# Patient Record
Sex: Female | Born: 1960 | Race: White | Hispanic: No | Marital: Married | Smoking: Never smoker
Health system: Southern US, Community
[De-identification: ages and names within clinical notes are randomized; demographics above are authoritative.]

## PROBLEM LIST (undated history)

## (undated) DIAGNOSIS — F32A Depression, unspecified: Secondary | ICD-10-CM

## (undated) DIAGNOSIS — K589 Irritable bowel syndrome without diarrhea: Secondary | ICD-10-CM

## (undated) DIAGNOSIS — G43909 Migraine, unspecified, not intractable, without status migrainosus: Secondary | ICD-10-CM

## (undated) DIAGNOSIS — T7840XA Allergy, unspecified, initial encounter: Secondary | ICD-10-CM

## (undated) DIAGNOSIS — M1990 Unspecified osteoarthritis, unspecified site: Secondary | ICD-10-CM

## (undated) DIAGNOSIS — J45909 Unspecified asthma, uncomplicated: Secondary | ICD-10-CM

## (undated) DIAGNOSIS — F329 Major depressive disorder, single episode, unspecified: Secondary | ICD-10-CM

## (undated) DIAGNOSIS — E781 Pure hyperglyceridemia: Principal | ICD-10-CM

## (undated) DIAGNOSIS — N200 Calculus of kidney: Secondary | ICD-10-CM

## (undated) HISTORY — DX: Depression, unspecified: F32.A

## (undated) HISTORY — DX: Pure hyperglyceridemia: E78.1

## (undated) HISTORY — DX: Migraine, unspecified, not intractable, without status migrainosus: G43.909

## (undated) HISTORY — DX: Unspecified asthma, uncomplicated: J45.909

## (undated) HISTORY — DX: Allergy, unspecified, initial encounter: T78.40XA

## (undated) HISTORY — DX: Irritable bowel syndrome, unspecified: K58.9

## (undated) HISTORY — DX: Major depressive disorder, single episode, unspecified: F32.9

## (undated) HISTORY — DX: Unspecified osteoarthritis, unspecified site: M19.90

## (undated) HISTORY — DX: Calculus of kidney: N20.0

---

## 1996-05-02 HISTORY — PX: OTHER SURGICAL HISTORY: SHX169

## 2016-07-25 ENCOUNTER — Ambulatory Visit (INDEPENDENT_AMBULATORY_CARE_PROVIDER_SITE_OTHER): Payer: BLUE CROSS/BLUE SHIELD | Admitting: Nurse Practitioner

## 2016-07-25 ENCOUNTER — Encounter: Payer: Self-pay | Admitting: Nurse Practitioner

## 2016-07-25 VITALS — BP 112/64 | HR 95 | Temp 98.3°F | Ht 69.0 in | Wt 219.0 lb

## 2016-07-25 DIAGNOSIS — R739 Hyperglycemia, unspecified: Secondary | ICD-10-CM

## 2016-07-25 DIAGNOSIS — J3089 Other allergic rhinitis: Secondary | ICD-10-CM | POA: Diagnosis not present

## 2016-07-25 DIAGNOSIS — Z136 Encounter for screening for cardiovascular disorders: Secondary | ICD-10-CM

## 2016-07-25 DIAGNOSIS — Z13220 Encounter for screening for lipoid disorders: Secondary | ICD-10-CM

## 2016-07-25 DIAGNOSIS — E669 Obesity, unspecified: Secondary | ICD-10-CM

## 2016-07-25 DIAGNOSIS — Z1159 Encounter for screening for other viral diseases: Secondary | ICD-10-CM | POA: Diagnosis not present

## 2016-07-25 DIAGNOSIS — Z8739 Personal history of other diseases of the musculoskeletal system and connective tissue: Secondary | ICD-10-CM | POA: Insufficient documentation

## 2016-07-25 DIAGNOSIS — F339 Major depressive disorder, recurrent, unspecified: Secondary | ICD-10-CM

## 2016-07-25 DIAGNOSIS — J309 Allergic rhinitis, unspecified: Secondary | ICD-10-CM | POA: Insufficient documentation

## 2016-07-25 MED ORDER — ALBUTEROL SULFATE HFA 108 (90 BASE) MCG/ACT IN AERS: 1.0000 | INHALATION_SPRAY | Freq: Four times a day (QID) | RESPIRATORY_TRACT | 2 refills | Status: DC | PRN

## 2016-07-25 MED ORDER — CETIRIZINE HCL 10 MG PO TABS: 10.0000 mg | ORAL_TABLET | Freq: Every day | ORAL | 0 refills | Status: DC

## 2016-07-25 MED ORDER — SALINE SPRAY 0.65 % NA SOLN: 1.0000 | NASAL | 0 refills | Status: DC | PRN

## 2016-07-25 MED ORDER — CITALOPRAM HYDROBROMIDE 20 MG PO TABS: 20.0000 mg | ORAL_TABLET | Freq: Every day | ORAL | 1 refills | Status: DC
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
## 2016-07-25 NOTE — Progress Notes (Signed)
Pre visit review using our clinic review tool, if applicable. No additional management support is needed unless otherwise documented below in the visit note. 

## 2016-07-25 NOTE — Patient Instructions (Addendum)
Sign medical release to get records from previous pcp.  Go to lab in basement when fasting at least 6-8hrs prior to blood draw.

## 2016-07-26 DIAGNOSIS — F339 Major depressive disorder, recurrent, unspecified: Secondary | ICD-10-CM | POA: Insufficient documentation

## 2016-07-26 DIAGNOSIS — R739 Hyperglycemia, unspecified: Secondary | ICD-10-CM | POA: Insufficient documentation

## 2016-07-26 DIAGNOSIS — F325 Major depressive disorder, single episode, in full remission: Secondary | ICD-10-CM | POA: Insufficient documentation

## 2016-07-26 NOTE — Assessment & Plan Note (Signed)
Not interested In any treatment at this time.

## 2016-07-27 ENCOUNTER — Other Ambulatory Visit (INDEPENDENT_AMBULATORY_CARE_PROVIDER_SITE_OTHER): Payer: BLUE CROSS/BLUE SHIELD

## 2016-07-27 DIAGNOSIS — R739 Hyperglycemia, unspecified: Secondary | ICD-10-CM | POA: Diagnosis not present

## 2016-07-27 DIAGNOSIS — Z13220 Encounter for screening for lipoid disorders: Secondary | ICD-10-CM

## 2016-07-27 DIAGNOSIS — E781 Pure hyperglyceridemia: Secondary | ICD-10-CM

## 2016-07-27 DIAGNOSIS — Z1159 Encounter for screening for other viral diseases: Secondary | ICD-10-CM

## 2016-07-27 DIAGNOSIS — E669 Obesity, unspecified: Secondary | ICD-10-CM | POA: Diagnosis not present

## 2016-07-27 LAB — LDL CHOLESTEROL, DIRECT: Direct LDL: 124 mg/dL

## 2016-07-27 LAB — COMPREHENSIVE METABOLIC PANEL
ALT: 27 U/L (ref 0–35)
AST: 23 U/L (ref 0–37)
Albumin: 4.4 g/dL (ref 3.5–5.2)
Alkaline Phosphatase: 77 U/L (ref 39–117)
BUN: 9 mg/dL (ref 6–23)
CALCIUM: 9.8 mg/dL (ref 8.4–10.5)
CHLORIDE: 101 meq/L (ref 96–112)
CO2: 29 meq/L (ref 19–32)
Creatinine, Ser: 0.65 mg/dL (ref 0.40–1.20)
GFR: 101.11 mL/min (ref >60.00)
GLUCOSE: 98 mg/dL (ref 70–99)
Potassium: 3.8 mEq/L (ref 3.5–5.1)
Sodium: 139 mEq/L (ref 135–145)
Total Bilirubin: 0.7 mg/dL (ref 0.2–1.2)
Total Protein: 7.4 g/dL (ref 6.0–8.3)

## 2016-07-27 LAB — LIPID PANEL
CHOL/HDL RATIO: 7
CHOLESTEROL: 305 mg/dL — AB (ref 0–200)
HDL: 46.5 mg/dL (ref >39.00)
Triglycerides: 606 mg/dL — ABNORMAL HIGH (ref 0.0–149.0)

## 2016-07-27 LAB — TSH: TSH: 2.07 u[IU]/mL (ref 0.35–4.50)

## 2016-07-27 LAB — HEMOGLOBIN A1C: HEMOGLOBIN A1C: 5.7 % (ref 4.6–6.5)

## 2016-07-28 LAB — HEPATITIS C ANTIBODY: HCV Ab: NEGATIVE

## 2016-07-28 MED ORDER — OMEGA-3-ACID ETHYL ESTERS 1 G PO CAPS: 2.0000 g | ORAL_CAPSULE | Freq: Every day | ORAL | 1 refills | Status: DC

## 2016-07-28 MED ORDER — FENOFIBRATE 145 MG PO TABS: 145.0000 mg | ORAL_TABLET | Freq: Every day | ORAL | 1 refills | Status: DC

## 2016-08-04 ENCOUNTER — Ambulatory Visit (INDEPENDENT_AMBULATORY_CARE_PROVIDER_SITE_OTHER): Payer: BLUE CROSS/BLUE SHIELD | Admitting: Nurse Practitioner

## 2016-08-04 ENCOUNTER — Encounter: Payer: Self-pay | Admitting: Nurse Practitioner

## 2016-08-04 VITALS — BP 124/80 | HR 81 | Temp 98.0°F | Ht 69.0 in | Wt 222.0 lb

## 2016-08-04 DIAGNOSIS — H6502 Acute serous otitis media, left ear: Secondary | ICD-10-CM

## 2016-08-04 DIAGNOSIS — R6889 Other general symptoms and signs: Secondary | ICD-10-CM

## 2016-08-04 DIAGNOSIS — J0140 Acute pansinusitis, unspecified: Secondary | ICD-10-CM

## 2016-08-04 LAB — POCT INFLUENZA A/B
INFLUENZA A, POC: NEGATIVE
INFLUENZA B, POC: NEGATIVE

## 2016-08-04 MED ORDER — AMOXICILLIN-POT CLAVULANATE 875-125 MG PO TABS: 1.0000 | ORAL_TABLET | Freq: Two times a day (BID) | ORAL | 0 refills | Status: DC

## 2016-08-04 MED ORDER — IPRATROPIUM BROMIDE 0.03 % NA SOLN: 2.0000 | Freq: Two times a day (BID) | NASAL | 0 refills | Status: DC

## 2016-08-04 MED ORDER — HYDROCODONE-HOMATROPINE 5-1.5 MG/5ML PO SYRP: 5.0000 mL | ORAL_SOLUTION | Freq: Every evening | ORAL | 0 refills | Status: DC | PRN

## 2016-08-04 MED ORDER — DM-GUAIFENESIN ER 30-600 MG PO TB12: 1.0000 | ORAL_TABLET | Freq: Two times a day (BID) | ORAL | 0 refills | Status: DC | PRN

## 2016-08-04 MED ORDER — METHYLPREDNISOLONE ACETATE 80 MG/ML IJ SUSP
80.0000 mg | Freq: Once | INTRAMUSCULAR | Status: AC
  Administered 2016-08-04: 80 mg via INTRAMUSCULAR

## 2016-08-04 NOTE — Progress Notes (Signed)
Subjective:  Patient ID: Christina Huerta, female    DOB: 02-24-1961  Age: 56 y.o. MRN: [id number]  CC: Sore Throat (sore troat,headache,coughing mucus (see some blood) for 3 days. took otc--- req written rx for today. )   URI   This is a new problem. The current episode started yesterday. The problem has been rapidly worsening. The maximum temperature recorded prior to her arrival was 100.4 - 100.9 F. Associated symptoms include chest pain, congestion, coughing, ear pain, headaches, a plugged ear sensation, rhinorrhea, sinus pain, a sore throat, swollen glands and wheezing. Pertinent negatives include no abdominal pain. She has tried acetaminophen, decongestant and increased fluids for the symptoms. The treatment provided no relief.    Outpatient Medications Prior to Visit  Medication Sig Dispense Refill  . albuterol (PROAIR HFA) 108 (90 Base) MCG/ACT inhaler Inhale 1-2 puffs into the lungs every 6 (six) hours as needed for wheezing or shortness of breath. 1 Inhaler 2  . Biotin 40981 MCG TABS Take 10,000 mg by mouth 2 (two) times daily.    . Black Cohosh 80 MG CAPS Take 80 mg by mouth 1 day or 1 dose.    . cetirizine (ZYRTEC) 10 MG tablet Take 1 tablet (10 mg total) by mouth daily. 30 tablet 0  . citalopram (CELEXA) 20 MG tablet Take 1 tablet (20 mg total) by mouth daily. 90 tablet 1  . Digestive Enzymes (DIGESTIVE SUPPORT PO) Take by mouth. IB Gard OTC    . fenofibrate (TRICOR) 145 MG tablet Take 1 tablet (145 mg total) by mouth daily. 90 tablet 1  . Multiple Vitamins-Minerals (MULTIVITAMIN ADULT PO) Take by mouth.    . omega-3 acid ethyl esters (LOVAZA) 1 g capsule Take 2 capsules (2 g total) by mouth daily. 180 capsule 1  . Probiotic Product (ALIGN PO) Take by mouth 1 day or 1 dose.    . Psyllium (METAMUCIL FIBER PO) Take by mouth 1 day or 1 dose.    . sodium chloride (OCEAN) 0.65 % SOLN nasal spray Place 1 spray into both nostrils as needed for congestion. 15 mL 0   No  facility-administered medications prior to visit.     ROS See HPI  Objective:  BP 124/80   Pulse 81   Temp 98 F (36.7 C)   Ht  (1.753 m)   Wt 222 lb (100.7 kg)   SpO2 98%   BMI 32.78 kg/m   BP Readings from Last 3 Encounters:  08/04/16 124/80  07/25/16 112/64    Wt Readings from Last 3 Encounters:  08/04/16 222 lb (100.7 kg)  07/25/16 219 lb (99.3 kg)    Physical Exam  Constitutional: She is oriented to person, place, and time.  HENT:  Right Ear: External ear and ear canal normal. Tympanic membrane is injected and erythematous. A middle ear effusion is present.  Left Ear: External ear and ear canal normal. Tympanic membrane is injected and erythematous. A middle ear effusion is present.  Nose: Mucosal edema and rhinorrhea present. Right sinus exhibits maxillary sinus tenderness and frontal sinus tenderness. Left sinus exhibits maxillary sinus tenderness and frontal sinus tenderness.  Mouth/Throat: Uvula is midline. No trismus in the jaw. Posterior oropharyngeal erythema present. No oropharyngeal exudate.  Eyes: No scleral icterus.  Neck: Normal range of motion. Neck supple.  Cardiovascular: Normal rate and normal heart sounds.   Pulmonary/Chest: Effort normal and breath sounds normal.  Musculoskeletal: She exhibits no edema.  Lymphadenopathy:    She has cervical adenopathy.  Neurological: She is alert and oriented to person, place, and time.  Vitals reviewed.   Lab Results  Component Value Date   GLUCOSE 98 07/27/2016   CHOL 305 (H) 07/27/2016   TRIG (H) 07/27/2016    606.0 Triglyceride is over 400; calculations on Lipids are invalid.   HDL 46.50 07/27/2016   LDLDIRECT 124.0 07/27/2016   ALT 27 07/27/2016   AST 23 07/27/2016   NA 139 07/27/2016   K 3.8 07/27/2016   CL 101 07/27/2016   CREATININE 0.65 07/27/2016   BUN 9 07/27/2016   CO2 29 07/27/2016   TSH 2.07 07/27/2016   HGBA1C 5.7 07/27/2016    Patient was never admitted.  Assessment & Plan:    Christina Huerta was seen today for sore throat.  Diagnoses and all orders for this visit:  Flu-like symptoms -     POCT Influenza A/B -     methylPREDNISolone acetate (DEPO-MEDROL) injection 80 mg; Inject 1 mL (80 mg total) into the muscle once. -     ipratropium (ATROVENT) 0.03 % nasal spray; Place 2 sprays into both nostrils 2 (two) times daily. Do not use for more than 5days. -     dextromethorphan-guaiFENesin (MUCINEX DM) 30-600 MG 12hr tablet; Take 1 tablet by mouth 2 (two) times daily as needed for cough. -     amoxicillin-clavulanate (AUGMENTIN) 875-125 MG tablet; Take 1 tablet by mouth 2 (two) times daily. -     HYDROcodone-homatropine (HYCODAN) 5-1.5 MG/5ML syrup; Take 5 mLs by mouth at bedtime as needed for cough.  Acute non-recurrent pansinusitis  Acute serous otitis media of left ear, recurrence not specified   I am having Christina Huerta start on ipratropium, dextromethorphan-guaiFENesin, amoxicillin-clavulanate, and HYDROcodone-homatropine. I am also having her maintain her Probiotic Product (ALIGN PO), Psyllium (METAMUCIL FIBER PO), Biotin, Black Cohosh, Multiple Vitamins-Minerals (MULTIVITAMIN ADULT PO), Digestive Enzymes (DIGESTIVE SUPPORT PO), cetirizine, sodium chloride, albuterol, citalopram, fenofibrate, and omega-3 acid ethyl esters. We administered methylPREDNISolone acetate.  Meds ordered this encounter  Medications  . methylPREDNISolone acetate (DEPO-MEDROL) injection 80 mg  . ipratropium (ATROVENT) 0.03 % nasal spray    Sig: Place 2 sprays into both nostrils 2 (two) times daily. Do not use for more than 5days.    Dispense:  30 mL    Refill:  0    Order Specific Question:   Supervising Provider    Answer:   Tresa Garter [[id number]]  . dextromethorphan-guaiFENesin (MUCINEX DM) 30-600 MG 12hr tablet    Sig: Take 1 tablet by mouth 2 (two) times daily as needed for cough.    Dispense:  14 tablet    Refill:  0    Order Specific Question:   Supervising Provider    Answer:    Tresa Garter [[id number]]  . amoxicillin-clavulanate (AUGMENTIN) 875-125 MG tablet    Sig: Take 1 tablet by mouth 2 (two) times daily.    Dispense:  14 tablet    Refill:  0    Order Specific Question:   Supervising Provider    Answer:   Tresa Garter [[id number]]  . HYDROcodone-homatropine (HYCODAN) 5-1.5 MG/5ML syrup    Sig: Take 5 mLs by mouth at bedtime as needed for cough.    Dispense:  120 mL    Refill:  0    Order Specific Question:   Supervising Provider    Answer:   Tresa Garter [[id number]]    Follow-up: No Follow-up on file.  [name], NP

## 2016-08-04 NOTE — Patient Instructions (Signed)
Encourage adequate oral hydration.  Sinusitis, Adult Sinusitis is soreness and inflammation of your sinuses. Sinuses are hollow spaces in the bones around your face. Your sinuses are located:  Around your eyes.  In the middle of your forehead.  Behind your nose.  In your cheekbones. Your sinuses and nasal passages are lined with a stringy fluid (mucus). Mucus normally drains out of your sinuses. When your nasal tissues become inflamed or swollen, the mucus can become trapped or blocked so air cannot flow through your sinuses. This allows bacteria, viruses, and funguses to grow, which leads to infection. Sinusitis can develop quickly and last for 7?10 days (acute) or for more than 12 weeks (chronic). Sinusitis often develops after a cold. What are the causes? This condition is caused by anything that creates swelling in the sinuses or stops mucus from draining, including:  Allergies.  Asthma.  Bacterial or viral infection.  Abnormally shaped bones between the nasal passages.  Nasal growths that contain mucus (nasal polyps).  Narrow sinus openings.  Pollutants, such as chemicals or irritants in the air.  A foreign object stuck in the nose.  A fungal infection. This is rare. What increases the risk? The following factors may make you more likely to develop this condition:  Having allergies or asthma.  Having had a recent cold or respiratory tract infection.  Having structural deformities or blockages in your nose or sinuses.  Having a weak immune system.  Doing a lot of swimming or diving.  Overusing nasal sprays.  Smoking. What are the signs or symptoms? The main symptoms of this condition are pain and a feeling of pressure around the affected sinuses. Other symptoms include:  Upper toothache.  Earache.  Headache.  Bad breath.  Decreased sense of smell and taste.  A cough that may get worse at night.  Fatigue.  Fever.  Thick drainage from your nose.  The drainage is often green and it may contain pus (purulent).  Stuffy nose or congestion.  Postnasal drip. This is when extra mucus collects in the throat or back of the nose.  Swelling and warmth over the affected sinuses.  Sore throat.  Sensitivity to light. How is this diagnosed? This condition is diagnosed based on symptoms, a medical history, and a physical exam. To find out if your condition is acute or chronic, your health care provider may:  Look in your nose for signs of nasal polyps.  Tap over the affected sinus to check for signs of infection.  View the inside of your sinuses using an imaging device that has a light attached (endoscope). If your health care provider suspects that you have chronic sinusitis, you may also:  Be tested for allergies.  Have a sample of mucus taken from your nose (nasal culture) and checked for bacteria.  Have a mucus sample examined to see if your sinusitis is related to an allergy. If your sinusitis does not respond to treatment and it lasts longer than 8 weeks, you may have an MRI or CT scan to check your sinuses. These scans also help to determine how severe your infection is. In rare cases, a bone biopsy may be done to rule out more serious types of fungal sinus disease. How is this treated? Treatment for sinusitis depends on the cause and whether your condition is chronic or acute. If a virus is causing your sinusitis, your symptoms will go away on their own within 10 days. You may be given medicines to relieve your symptoms, including:  Topical nasal decongestants. They shrink swollen nasal passages and let mucus drain from your sinuses.  Antihistamines. These drugs block inflammation that is triggered by allergies. This can help to ease swelling in your nose and sinuses.  Topical nasal corticosteroids. These are nasal sprays that ease inflammation and swelling in your nose and sinuses.  Nasal saline washes. These rinses can help to  get rid of thick mucus in your nose. If your condition is caused by bacteria, you will be given an antibiotic medicine. If your condition is caused by a fungus, you will be given an antifungal medicine. Surgery may be needed to correct underlying conditions, such as narrow nasal passages. Surgery may also be needed to remove polyps. Follow these instructions at home: Medicines   Take, use, or apply over-the-counter and prescription medicines only as told by your health care provider. These may include nasal sprays.  If you were prescribed an antibiotic medicine, take it as told by your health care provider. Do not stop taking the antibiotic even if you start to feel better. Hydrate and Humidify   Drink enough water to keep your urine clear or pale yellow. Staying hydrated will help to thin your mucus.  Use a cool mist humidifier to keep the humidity level in your home above 50%.  Inhale steam for 10-15 minutes, 3-4 times a day or as told by your health care provider. You can do this in the bathroom while a hot shower is running.  Limit your exposure to cool or dry air. Rest   Rest as much as possible.  Sleep with your head raised (elevated).  Make sure to get enough sleep each night. General instructions   Apply a warm, moist washcloth to your face 3-4 times a day or as told by your health care provider. This will help with discomfort.  Wash your hands often with soap and water to reduce your exposure to viruses and other germs. If soap and water are not available, use hand sanitizer.  Do not smoke. Avoid being around people who are smoking (secondhand smoke).  Keep all follow-up visits as told by your health care provider. This is important. Contact a health care provider if:  You have a fever.  Your symptoms get worse.  Your symptoms do not improve within 10 days. Get help right away if:  You have a severe headache.  You have persistent vomiting.  You have pain or  swelling around your face or eyes.  You have vision problems.  You develop confusion.  Your neck is stiff.  You have trouble breathing. This information is not intended to replace advice given to you by your health care provider. Make sure you discuss any questions you have with your health care provider. Document Released: 04/18/2005 Document Revised: 12/13/2015 Document Reviewed: 02/11/2015 Elsevier Interactive Patient Education  2017 [employer].

## 2016-08-04 NOTE — Progress Notes (Signed)
Pre visit review using our clinic review tool, if applicable. No additional management support is needed unless otherwise documented below in the visit note. 

## 2016-08-08 ENCOUNTER — Telehealth: Payer: Self-pay | Admitting: Nurse Practitioner

## 2016-08-08 ENCOUNTER — Encounter: Payer: Self-pay | Admitting: Nurse Practitioner

## 2016-08-08 NOTE — Telephone Encounter (Signed)
Please advise 

## 2016-08-08 NOTE — Telephone Encounter (Signed)
Pt called stating that since she has been taking the amoxicillin-clavulanate (AUGMENTIN) 875-125 MG tablet that [facility] prescribed she now has a yeast infection. Is there anything that we can give her for this? Please advise.

## 2016-08-09 MED ORDER — FLUCONAZOLE 150 MG PO TABS: 150.0000 mg | ORAL_TABLET | Freq: Once | ORAL | 0 refills | Status: AC

## 2016-08-09 NOTE — Telephone Encounter (Signed)
Patient is calling again about wanting something for a yeast infection. Please let patient know once we have an answer.

## 2016-08-10 MED ORDER — FLUCONAZOLE 150 MG PO TABS: 150.0000 mg | ORAL_TABLET | Freq: Once | ORAL | 0 refills | Status: AC

## 2016-08-10 NOTE — Telephone Encounter (Signed)
Medication sent to pharmacy  

## 2016-08-10 NOTE — Telephone Encounter (Signed)
Please help, do not see anything send in for yeast infection to [place] [facility]. Claris Gower is out of the office.

## 2016-08-31 ENCOUNTER — Encounter: Payer: Self-pay | Admitting: Nurse Practitioner

## 2016-08-31 ENCOUNTER — Ambulatory Visit (INDEPENDENT_AMBULATORY_CARE_PROVIDER_SITE_OTHER): Payer: BLUE CROSS/BLUE SHIELD | Admitting: Nurse Practitioner

## 2016-08-31 VITALS — BP 126/78 | HR 91 | Temp 99.0°F | Wt 220.0 lb

## 2016-08-31 DIAGNOSIS — M25461 Effusion, right knee: Secondary | ICD-10-CM | POA: Diagnosis not present

## 2016-08-31 DIAGNOSIS — M25561 Pain in right knee: Secondary | ICD-10-CM | POA: Diagnosis not present

## 2016-08-31 MED ORDER — MELOXICAM 7.5 MG PO TABS: 7.5000 mg | ORAL_TABLET | Freq: Every day | ORAL | 0 refills | Status: DC | PRN

## 2016-08-31 NOTE — Progress Notes (Signed)
Pre visit review using our clinic review tool, if applicable. No additional management support is needed unless otherwise documented below in the visit note. 

## 2016-08-31 NOTE — Patient Instructions (Signed)
Go to basement for x-ray. Use meloxicam as prescribed. Use knee sleeve during the day and off at night. If no improvement in 1week, will order knee MRI

## 2016-08-31 NOTE — Progress Notes (Signed)
Subjective:  Patient ID: Christina Huerta, female    DOB: 1960/10/02  Age: 56 y.o. MRN: [id number]  CC: Knee Pain (right knee pain for about 2 weeks, even hurts when lying down--has tried copper sleeve, could have possibly stepped wrong on uneven dirt in her yard that started the pain)  HPI: Knee Pain   The incident occurred more than 1 week ago. The incident occurred at home. The injury mechanism was a twisting injury. The pain is present in the right knee. The quality of the pain is described as aching, burning and shooting. The pain has been fluctuating since onset. Associated symptoms include an inability to bear weight. Pertinent negatives include no loss of motion, numbness or tingling. The symptoms are aggravated by movement and weight bearing. She has tried acetaminophen, immobilization, rest and NSAIDs for the symptoms. The treatment provided mild relief.    Outpatient Medications Prior to Visit  Medication Sig Dispense Refill  . Biotin 40981 MCG TABS Take 10,000 mg by mouth 2 (two) times daily.    . Black Cohosh 80 MG CAPS Take 80 mg by mouth 1 day or 1 dose.    . cetirizine (ZYRTEC) 10 MG tablet Take 1 tablet (10 mg total) by mouth daily. 30 tablet 0  . citalopram (CELEXA) 20 MG tablet Take 1 tablet (20 mg total) by mouth daily. 90 tablet 1  . dextromethorphan-guaiFENesin (MUCINEX DM) 30-600 MG 12hr tablet Take 1 tablet by mouth 2 (two) times daily as needed for cough. 14 tablet 0  . Digestive Enzymes (DIGESTIVE SUPPORT PO) Take by mouth. IB Gard OTC    . fenofibrate (TRICOR) 145 MG tablet Take 1 tablet (145 mg total) by mouth daily. 90 tablet 1  . Multiple Vitamins-Minerals (MULTIVITAMIN ADULT PO) Take by mouth.    . omega-3 acid ethyl esters (LOVAZA) 1 g capsule Take 2 capsules (2 g total) by mouth daily. 180 capsule 1  . Probiotic Product (ALIGN PO) Take by mouth 1 day or 1 dose.    . Psyllium (METAMUCIL FIBER PO) Take by mouth 1 day or 1 dose.    . sodium chloride (OCEAN) 0.65 %  SOLN nasal spray Place 1 spray into both nostrils as needed for congestion. 15 mL 0  . albuterol (PROAIR HFA) 108 (90 Base) MCG/ACT inhaler Inhale 1-2 puffs into the lungs every 6 (six) hours as needed for wheezing or shortness of breath. 1 Inhaler 2  . amoxicillin-clavulanate (AUGMENTIN) 875-125 MG tablet Take 1 tablet by mouth 2 (two) times daily. 14 tablet 0  . HYDROcodone-homatropine (HYCODAN) 5-1.5 MG/5ML syrup Take 5 mLs by mouth at bedtime as needed for cough. 120 mL 0  . ipratropium (ATROVENT) 0.03 % nasal spray Place 2 sprays into both nostrils 2 (two) times daily. Do not use for more than 5days. 30 mL 0   No facility-administered medications prior to visit.     ROS See HPI  Objective:  BP 126/78   Pulse 91   Temp 99 F (37.2 C)   Wt 220 lb (99.8 kg)   SpO2 98%   BMI 32.49 kg/m   BP Readings from Last 3 Encounters:  08/31/16 126/78  08/04/16 124/80  07/25/16 112/64    Wt Readings from Last 3 Encounters:  08/31/16 220 lb (99.8 kg)  08/04/16 222 lb (100.7 kg)  07/25/16 219 lb (99.3 kg)    Physical Exam  Constitutional: She is oriented to person, place, and time.  Cardiovascular: Normal rate and normal heart sounds.  Pulmonary/Chest: Effort normal.  Musculoskeletal: Normal range of motion. She exhibits edema and tenderness.       Right knee: She exhibits effusion and erythema. She exhibits normal range of motion, normal patellar mobility and no bony tenderness. Tenderness found. Lateral joint line tenderness noted. No medial joint line and no patellar tendon tenderness noted.       Right ankle: Normal.  Neurological: She is alert and oriented to person, place, and time.  Skin: Skin is warm and dry. No rash noted. No erythema.  Vitals reviewed.   Lab Results  Component Value Date   GLUCOSE 98 07/27/2016   CHOL 305 (H) 07/27/2016   TRIG (H) 07/27/2016    606.0 Triglyceride is over 400; calculations on Lipids are invalid.   HDL 46.50 07/27/2016   LDLDIRECT  124.0 07/27/2016   ALT 27 07/27/2016   AST 23 07/27/2016   NA 139 07/27/2016   K 3.8 07/27/2016   CL 101 07/27/2016   CREATININE 0.65 07/27/2016   BUN 9 07/27/2016   CO2 29 07/27/2016   TSH 2.07 07/27/2016   HGBA1C 5.7 07/27/2016    Patient was never admitted.  Assessment & Plan:   Maly was seen today for knee pain.  Diagnoses and all orders for this visit:  Pain and swelling of knee, right -     meloxicam (MOBIC) 7.5 MG tablet; Take 1 tablet (7.5 mg total) by mouth daily as needed for pain. -     DG Knee Complete 4 Views Right; Future   I have discontinued Ms. Gargis's albuterol, ipratropium, amoxicillin-clavulanate, and HYDROcodone-homatropine. I am also having her start on meloxicam. Additionally, I am having her maintain her Probiotic Product (ALIGN PO), Psyllium (METAMUCIL FIBER PO), Biotin, Black Cohosh, Multiple Vitamins-Minerals (MULTIVITAMIN ADULT PO), Digestive Enzymes (DIGESTIVE SUPPORT PO), cetirizine, sodium chloride, citalopram, fenofibrate, omega-3 acid ethyl esters, and dextromethorphan-guaiFENesin.  Meds ordered this encounter  Medications  . meloxicam (MOBIC) 7.5 MG tablet    Sig: Take 1 tablet (7.5 mg total) by mouth daily as needed for pain.    Dispense:  30 tablet    Refill:  0    Order Specific Question:   Supervising Provider    Answer:   Tresa Garter [[id number]]    Follow-up: Return if symptoms worsen or fail to improve.  [name], NP

## 2016-09-28 ENCOUNTER — Encounter: Payer: Self-pay | Admitting: Family Medicine

## 2016-09-28 ENCOUNTER — Ambulatory Visit (INDEPENDENT_AMBULATORY_CARE_PROVIDER_SITE_OTHER): Payer: BLUE CROSS/BLUE SHIELD | Admitting: Family Medicine

## 2016-09-28 VITALS — BP 130/90 | HR 77 | Temp 98.3°F | Ht 69.0 in | Wt 221.0 lb

## 2016-09-28 DIAGNOSIS — J018 Other acute sinusitis: Secondary | ICD-10-CM | POA: Diagnosis not present

## 2016-09-28 MED ORDER — AZITHROMYCIN 250 MG PO TABS: ORAL_TABLET | ORAL | 0 refills | Status: DC

## 2016-09-28 MED ORDER — METHYLPREDNISOLONE ACETATE 80 MG/ML IJ SUSP
120.0000 mg | Freq: Once | INTRAMUSCULAR | Status: AC
  Administered 2016-09-28: 120 mg via INTRAMUSCULAR

## 2016-09-28 NOTE — Addendum Note (Signed)
Addended by: Aniceto BossNIMMONS, SYLVIA A on: 09/28/2016 04:34 PM   Modules accepted: Orders

## 2016-09-28 NOTE — Patient Instructions (Signed)
WE NOW OFFER   [facility] FAST TRACK!!!  SAME DAY Appointments for ACUTE CARE  Such as: Sprains, Injuries, cuts, abrasions, rashes, muscle pain, joint pain, back pain Colds, flu, sore throats, headache, allergies, cough, fever  Ear pain, sinus and eye infections Abdominal pain, nausea, vomiting, diarrhea, upset stomach Animal/insect bites  3 Easy Ways to Schedule: Walk-In Scheduling Call in scheduling Mychart Sign-up: [email or web address]         

## 2016-09-28 NOTE — Progress Notes (Signed)
   Subjective:    Patient ID: Christina Huerta, female    DOB: 1960/11/22, 56 y.o.   MRN: [id number]  HPI Here for 2 weeks of sinus pressure which has worsened, now she has pain in the left ear, PND, and a ST. No coughing. Using Zycam.    Review of Systems  Constitutional: Negative.   HENT: Positive for congestion, postnasal drip, sinus pain, sinus pressure and sore throat.   Eyes: Negative.   Respiratory: Negative.        Objective:   Physical Exam  Constitutional: She appears well-developed and well-nourished. No distress.  HENT:  Right Ear: External ear normal.  Left Ear: External ear normal.  Nose: Nose normal.  Mouth/Throat: Oropharynx is clear and moist.  Eyes: Conjunctivae are normal.  Neck: No thyromegaly present.  Pulmonary/Chest: Effort normal and breath sounds normal. No respiratory distress. She has no wheezes. She has no rales.  Lymphadenopathy:    She has no cervical adenopathy.          Assessment & Plan:  Sinusitis, treat with a Zpack and a steroid shot.  Gershon CraneStephen Elanie Hammitt, MD

## 2016-10-12 ENCOUNTER — Encounter: Payer: Self-pay | Admitting: Nurse Practitioner

## 2016-10-12 ENCOUNTER — Ambulatory Visit (INDEPENDENT_AMBULATORY_CARE_PROVIDER_SITE_OTHER): Payer: BLUE CROSS/BLUE SHIELD | Admitting: Nurse Practitioner

## 2016-10-12 ENCOUNTER — Ambulatory Visit (INDEPENDENT_AMBULATORY_CARE_PROVIDER_SITE_OTHER)
Admission: RE | Admit: 2016-10-12 | Discharge: 2016-10-12 | Disposition: A | Discharge status: Home / Self Care | Payer: BLUE CROSS/BLUE SHIELD | Source: Ambulatory Visit | Attending: Nurse Practitioner | Admitting: Nurse Practitioner

## 2016-10-12 VITALS — BP 124/86 | HR 73 | Temp 97.9°F | Ht 69.0 in | Wt 222.0 lb

## 2016-10-12 DIAGNOSIS — S20211A Contusion of right front wall of thorax, initial encounter: Secondary | ICD-10-CM

## 2016-10-12 NOTE — Patient Instructions (Addendum)
Use tylenol or meloxicam prn for pain. Continue cold compress as needed.   Chest Contusion, Adult A chest contusion is a deep bruise on the chest. Bruises happen when an injury causes bleeding under the skin. Signs of bruising include pain, puffiness (swelling), and skin that has changed from its normal color. The bruise may turn blue, purple, or yellow. Minor injuries may give you a painless bruise, but worse bruises may stay painful and swollen for a few weeks. Follow these instructions at home:  If directed, put ice on the injured area. ? Put ice in a plastic bag. ? Place a towel between your skin and the bag. ? Leave the ice on for 20 minutes, 2-3 times per day.  Take over-the-counter and prescription medicines only as told by your doctor.  If told by your doctor, do deep-breathing exercises.  Do not lie down flat on your back. Keep your head and chest raised (elevated) when you rest or sleep.  Do not use any products that contain nicotine or tobacco, such as cigarettes and e-cigarettes. If you need help quitting, ask your doctor.  Do not lift anything that causes pain. Contact a doctor if:  Medicines or treatment do not help your swelling or pain.  You have more bruising.  You have more swelling.  Your pain gets worse  Your symptoms do not get better in one week. Get help right away if:  You suddenly have a lot more pain.  You have trouble breathing.  You feel dizzy or weak.  You pass out (faint).  You have blood in your pee (urine) or poop (stool).  You cough up blood or you throw up (vomit) blood. Summary  A chest contusion is a deep bruise on the chest. Bruises happen when an injury causes bleeding under the skin.  Treatment may include resting and putting ice on the injured area.  Contact a doctor if you have trouble breathing or if your pain does not get better with treatment. This information is not intended to replace advice given to you by your health  care provider. Make sure you discuss any questions you have with your health care provider. Document Released: 10/05/2007 Document Revised: 01/14/2016 Document Reviewed: 01/14/2016 Elsevier Interactive Patient Education  2017 [employer]

## 2016-10-12 NOTE — Progress Notes (Signed)
Subjective:  Patient ID: Christina Huerta, female    DOB: 1960/10/07  Age: 56 y.o. MRN: [id number]  CC: Rib Injury (right lower back pain,bruises. fail 3 days ago on th chair)  Chest Pain   This is a new (fell against wooden chair.) problem. The current episode started yesterday. The problem occurs intermittently. The pain is present in the lateral region (right side, posterior). The pain is moderate. The quality of the pain is described as sharp. The pain does not radiate. Pertinent negatives include no cough, dizziness, exertional chest pressure, fever, malaise/fatigue, nausea, numbness, orthopnea, palpitations, shortness of breath, vomiting or weakness. The pain is aggravated by movement. She has tried rest for the symptoms.  no hematuria or dysuria  Outpatient Medications Prior to Visit  Medication Sig Dispense Refill  . Biotin 78295 MCG TABS Take 10,000 mg by mouth 2 (two) times daily.    . Black Cohosh 80 MG CAPS Take 80 mg by mouth 1 day or 1 dose.    . citalopram (CELEXA) 20 MG tablet Take 1 tablet (20 mg total) by mouth daily. 90 tablet 1  . fenofibrate (TRICOR) 145 MG tablet Take 1 tablet (145 mg total) by mouth daily. 90 tablet 1  . Multiple Vitamins-Minerals (MULTIVITAMIN ADULT PO) Take by mouth.    . omega-3 acid ethyl esters (LOVAZA) 1 g capsule Take 2 capsules (2 g total) by mouth daily. 180 capsule 1  . Probiotic Product (ALIGN PO) Take by mouth 1 day or 1 dose.    . Psyllium (METAMUCIL FIBER PO) Take by mouth 1 day or 1 dose.    . Digestive Enzymes (DIGESTIVE SUPPORT PO) Take by mouth. IB Gard OTC    . meloxicam (MOBIC) 7.5 MG tablet Take 1 tablet (7.5 mg total) by mouth daily as needed for pain. (Patient not taking: Reported on 10/12/2016) 30 tablet 0  . azithromycin (ZITHROMAX) 250 MG tablet As directed (Patient not taking: Reported on 10/12/2016) 6 tablet 0  . cetirizine (ZYRTEC) 10 MG tablet Take 1 tablet (10 mg total) by mouth daily. (Patient not taking: Reported on 09/28/2016)  30 tablet 0  . dextromethorphan-guaiFENesin (MUCINEX DM) 30-600 MG 12hr tablet Take 1 tablet by mouth 2 (two) times daily as needed for cough. (Patient not taking: Reported on 09/28/2016) 14 tablet 0  . sodium chloride (OCEAN) 0.65 % SOLN nasal spray Place 1 spray into both nostrils as needed for congestion. (Patient not taking: Reported on 10/12/2016) 15 mL 0   No facility-administered medications prior to visit.     ROS See HPI  Objective:  BP 124/86   Pulse 73   Temp 97.9 F (36.6 C)   Ht 5\' 9"  (1.753 m)   Wt 222 lb (100.7 kg)   SpO2 95%   BMI 32.78 kg/m   BP Readings from Last 3 Encounters:  10/12/16 124/86  09/28/16 130/90  08/31/16 126/78    Wt Readings from Last 3 Encounters:  10/12/16 222 lb (100.7 kg)  09/28/16 221 lb (100.2 kg)  08/31/16 220 lb (99.8 kg)    Physical Exam  Constitutional: She is oriented to person, place, and time. No distress.  Neck: Normal range of motion. Neck supple.  Cardiovascular: Normal rate and regular rhythm.   Pulmonary/Chest: Effort normal and breath sounds normal. No respiratory distress.     She exhibits tenderness.    Musculoskeletal: She exhibits tenderness.  Neurological: She is alert and oriented to person, place, and time.  Vitals reviewed.   Lab Results  Component Value Date   GLUCOSE 98 07/27/2016   CHOL 305 (H) 07/27/2016   TRIG (H) 07/27/2016    606.0 Triglyceride is over 400; calculations on Lipids are invalid.   HDL 46.50 07/27/2016   LDLDIRECT 124.0 07/27/2016   ALT 27 07/27/2016   AST 23 07/27/2016   NA 139 07/27/2016   K 3.8 07/27/2016   CL 101 07/27/2016   CREATININE 0.65 07/27/2016   BUN 9 07/27/2016   CO2 29 07/27/2016   TSH 2.07 07/27/2016   HGBA1C 5.7 07/27/2016    Patient was never admitted.  Assessment & Plan:   Christina Huerta was seen today for rib injury.  Diagnoses and all orders for this visit:  Contusion of right chest wall, initial encounter -     DG Ribs Unilateral Right;  Future   I have discontinued Ms. Michon's cetirizine, sodium chloride, dextromethorphan-guaiFENesin, and azithromycin. I am also having her maintain her Probiotic Product (ALIGN PO), Psyllium (METAMUCIL FIBER PO), Biotin, Black Cohosh, Multiple Vitamins-Minerals (MULTIVITAMIN ADULT PO), Digestive Enzymes (DIGESTIVE SUPPORT PO), citalopram, fenofibrate, omega-3 acid ethyl esters, and meloxicam.  No orders of the defined types were placed in this encounter.   Follow-up: Return if symptoms worsen or fail to improve.  Alysia Pennaharlotte Makail Watling, NP

## 2016-10-21 ENCOUNTER — Encounter: Payer: Self-pay | Admitting: Adult Health

## 2016-10-21 ENCOUNTER — Ambulatory Visit (INDEPENDENT_AMBULATORY_CARE_PROVIDER_SITE_OTHER): Payer: BLUE CROSS/BLUE SHIELD | Admitting: Adult Health

## 2016-10-21 VITALS — BP 138/78 | Temp 98.0°F | Ht 69.0 in | Wt 219.5 lb

## 2016-10-21 DIAGNOSIS — H6692 Otitis media, unspecified, left ear: Secondary | ICD-10-CM

## 2016-10-21 MED ORDER — AMOXICILLIN 500 MG PO CAPS: 500.0000 mg | ORAL_CAPSULE | Freq: Two times a day (BID) | ORAL | 0 refills | Status: DC

## 2016-10-21 NOTE — Progress Notes (Signed)
Subjective:    Patient ID: Christina Huerta, female    DOB: April 26, 1961, 56 y.o.   MRN: [id number]  Sinusitis  This is a recurrent problem. The current episode started in the past 7 days. The problem has been gradually worsening since onset. There has been no fever. Associated symptoms include ear pain, headaches and sinus pressure. Pertinent negatives include no sore throat.    Review of Systems  Constitutional: Negative.   HENT: Positive for ear discharge, ear pain, sinus pain and sinus pressure. Negative for sore throat.   Respiratory: Negative.   Neurological: Positive for headaches.   Past Medical History:  Diagnosis Date  . Allergy   . Arthritis   . Asthma   . Depression   . IBS (irritable bowel syndrome)   . Kidney stone   . Migraines     Social History   Social History  . Marital status: Married    Spouse name: N/A  . Number of children: N/A  . Years of education: N/A   Occupational History  . Not on file.   Social History Main Topics  . Smoking status: Never Smoker  . Smokeless tobacco: Never Used  . Alcohol use Yes     Comment: social  . Drug use: No  . Sexual activity: Not on file   Other Topics Concern  . Not on file   Social History Narrative  . No narrative on file    Past Surgical History:  Procedure Laterality Date  . CESAREAN SECTION  1996  . scar neunoma surgery  1998    Family History  Problem Relation Age of Onset  . Hyperlipidemia Mother   . Hyperlipidemia Father   . Heart disease Father   . Hypertension Father   . Kidney disease Father   . Arthritis Maternal Grandmother   . Hyperlipidemia Maternal Grandmother   . Cancer Maternal Grandfather        prostate cancer  . Hyperlipidemia Maternal Grandfather   . Arthritis Paternal Grandmother   . Hyperlipidemia Paternal Grandmother   . Cancer Paternal Grandfather        colon cancer  . Hyperlipidemia Paternal Grandfather   . Heart disease Paternal Grandfather   . Stroke Paternal  Grandfather   . Hypertension Paternal Grandfather     Allergies  Allergen Reactions  . Imitrex [Sumatriptan] Other (See Comments)    Face swelling,break out,red.   . Latex Other (See Comments)    Break out    Current Outpatient Prescriptions on File Prior to Visit  Medication Sig Dispense Refill  . Biotin 7829510000 MCG TABS Take 10,000 mg by mouth 2 (two) times daily.    . Black Cohosh 80 MG CAPS Take 80 mg by mouth 1 day or 1 dose.    . citalopram (CELEXA) 20 MG tablet Take 1 tablet (20 mg total) by mouth daily. 90 tablet 1  . Digestive Enzymes (DIGESTIVE SUPPORT PO) Take by mouth. IB Gard OTC    . fenofibrate (TRICOR) 145 MG tablet Take 1 tablet (145 mg total) by mouth daily. 90 tablet 1  . meloxicam (MOBIC) 7.5 MG tablet Take 1 tablet (7.5 mg total) by mouth daily as needed for pain. 30 tablet 0  . Multiple Vitamins-Minerals (MULTIVITAMIN ADULT PO) Take by mouth.    . omega-3 acid ethyl esters (LOVAZA) 1 g capsule Take 2 capsules (2 g total) by mouth daily. 180 capsule 1  . Probiotic Product (ALIGN PO) Take by mouth 1 day or 1 dose.    .Marland Kitchen  Psyllium (METAMUCIL FIBER PO) Take by mouth 1 day or 1 dose.     No current facility-administered medications on file prior to visit.     BP 138/78 (BP Location: Left Arm, Patient Position: Sitting, Cuff Size: Normal)   Temp 98 F (36.7 C) (Oral)   Ht 5\' 9"  (1.753 m)   Wt 219 lb 8 oz (99.6 kg)   BMI 32.41 kg/m       Objective:   Physical Exam  Constitutional: Christina Huerta appears well-developed and well-nourished. No distress.  HENT:  Head: Normocephalic and atraumatic.  Right Ear: Hearing, tympanic membrane, external ear and ear canal normal. Tympanic membrane is not erythematous and not bulging.  Left Ear: External ear normal. No drainage. Tympanic membrane is erythematous and bulging.  Nose: Right sinus exhibits no maxillary sinus tenderness and no frontal sinus tenderness. Left sinus exhibits maxillary sinus tenderness and frontal sinus  tenderness.  Mouth/Throat: Uvula is midline, oropharynx is clear and moist and mucous membranes are normal.  Eyes: EOM are normal. Pupils are equal, round, and reactive to light. Right eye exhibits no discharge. Left eye exhibits no discharge.  Cardiovascular: Normal rate, regular rhythm, normal heart sounds and intact distal pulses.  Exam reveals no gallop and no friction rub.   No murmur heard. Skin: Christina Huerta is not diaphoretic.  Nursing note and vitals reviewed.     Assessment & Plan:  1. Left otitis media, unspecified otitis media type - Exam consistent with otitis media. No drainage noted.  - amoxicillin (AMOXIL) 500 MG capsule; Take 1 capsule (500 mg total) by mouth 2 (two) times daily.  Dispense: 20 capsule; Refill: 0 - Follow up if no improvement in the next 2-3 days   Shirline Frees, AGNP

## 2016-10-25 ENCOUNTER — Ambulatory Visit: Payer: BLUE CROSS/BLUE SHIELD | Admitting: Nurse Practitioner

## 2016-10-26 ENCOUNTER — Encounter: Payer: Self-pay | Admitting: Nurse Practitioner

## 2016-10-26 ENCOUNTER — Ambulatory Visit (INDEPENDENT_AMBULATORY_CARE_PROVIDER_SITE_OTHER): Payer: BLUE CROSS/BLUE SHIELD | Admitting: Nurse Practitioner

## 2016-10-26 VITALS — BP 140/86 | HR 98 | Temp 98.2°F | Ht 69.0 in | Wt 220.0 lb

## 2016-10-26 DIAGNOSIS — H6983 Other specified disorders of Eustachian tube, bilateral: Secondary | ICD-10-CM

## 2016-10-26 DIAGNOSIS — H6993 Unspecified Eustachian tube disorder, bilateral: Secondary | ICD-10-CM

## 2016-10-26 DIAGNOSIS — H6502 Acute serous otitis media, left ear: Secondary | ICD-10-CM

## 2016-10-26 DIAGNOSIS — E781 Pure hyperglyceridemia: Secondary | ICD-10-CM

## 2016-10-26 MED ORDER — CETIRIZINE HCL 10 MG PO TABS: 10.0000 mg | ORAL_TABLET | Freq: Every day | ORAL | 0 refills | Status: DC

## 2016-10-26 MED ORDER — CEFDINIR 300 MG PO CAPS: 300.0000 mg | ORAL_CAPSULE | Freq: Two times a day (BID) | ORAL | 0 refills | Status: DC

## 2016-10-26 NOTE — Patient Instructions (Signed)
Go to basement for blood draw (need to be fasting at least 6-8hrs prior to blood draw)  Will call with results

## 2016-10-26 NOTE — Progress Notes (Signed)
Subjective:  Patient ID: Christina Huerta, female    DOB: 1960-11-19  Age: 56 y.o. MRN: [id number]  CC: Follow-up (3 FOLLOW UP/feels like water in right ear/)   Otalgia   There is pain in both ears. This is a new problem. The current episode started in the past 7 days. The problem occurs constantly. The problem has been waxing and waning. There has been no fever. Pertinent negatives include no abdominal pain, coughing, diarrhea, ear discharge, headaches, hearing loss, neck pain, rash, rhinorrhea, sore throat or vomiting. She has tried antibiotics for the symptoms. The treatment provided mild relief.   Hypertriglyceride: Has mild heartburn with use of fish oil. Improved with use of vinegar. Maintains low fat diet No exercise.  Outpatient Medications Prior to Visit  Medication Sig Dispense Refill  . Biotin 40981 MCG TABS Take 10,000 mg by mouth 2 (two) times daily.    . Black Cohosh 80 MG CAPS Take 80 mg by mouth 1 day or 1 dose.    . citalopram (CELEXA) 20 MG tablet Take 1 tablet (20 mg total) by mouth daily. 90 tablet 1  . Digestive Enzymes (DIGESTIVE SUPPORT PO) Take by mouth. IB Gard OTC    . fenofibrate (TRICOR) 145 MG tablet Take 1 tablet (145 mg total) by mouth daily. 90 tablet 1  . meloxicam (MOBIC) 7.5 MG tablet Take 1 tablet (7.5 mg total) by mouth daily as needed for pain. 30 tablet 0  . Multiple Vitamins-Minerals (MULTIVITAMIN ADULT PO) Take by mouth.    . omega-3 acid ethyl esters (LOVAZA) 1 g capsule Take 2 capsules (2 g total) by mouth daily. 180 capsule 1  . Probiotic Product (ALIGN PO) Take by mouth 1 day or 1 dose.    . Psyllium (METAMUCIL FIBER PO) Take by mouth 1 day or 1 dose.    Marland Kitchen amoxicillin (AMOXIL) 500 MG capsule Take 1 capsule (500 mg total) by mouth 2 (two) times daily. 20 capsule 0   No facility-administered medications prior to visit.     ROS See HPI  Objective:  BP 140/86   Pulse 98   Temp 98.2 F (36.8 C)   Ht 5\' 9"  (1.753 m)   Wt 220 lb (99.8 kg)    SpO2 98%   BMI 32.49 kg/m   BP Readings from Last 3 Encounters:  10/26/16 140/86  10/21/16 138/78  10/12/16 124/86    Wt Readings from Last 3 Encounters:  10/26/16 220 lb (99.8 kg)  10/21/16 219 lb 8 oz (99.6 kg)  10/12/16 222 lb (100.7 kg)    Physical Exam  Constitutional: She is oriented to person, place, and time.  HENT:  Right Ear: External ear and ear canal normal. No drainage. No mastoid tenderness. Tympanic membrane is not injected, not erythematous and not bulging. A middle ear effusion is present.  Left Ear: External ear and ear canal normal. No drainage. No mastoid tenderness. Tympanic membrane is injected and erythematous. Tympanic membrane is not bulging. A middle ear effusion is present.  Nose: Mucosal edema and rhinorrhea present. Right sinus exhibits no maxillary sinus tenderness and no frontal sinus tenderness. Left sinus exhibits no maxillary sinus tenderness and no frontal sinus tenderness.  Mouth/Throat: Uvula is midline. No trismus in the jaw. Posterior oropharyngeal erythema present. No oropharyngeal exudate.  Eyes: No scleral icterus.  Neck: Normal range of motion. Neck supple. No thyromegaly present.  Cardiovascular: Normal rate and normal heart sounds.   Pulmonary/Chest: Effort normal and breath sounds normal.  Musculoskeletal: She  exhibits no edema.  Lymphadenopathy:    She has no cervical adenopathy.  Neurological: She is alert and oriented to person, place, and time.  Vitals reviewed.   Lab Results  Component Value Date   GLUCOSE 98 07/27/2016   CHOL 305 (H) 07/27/2016   TRIG (H) 07/27/2016    606.0 Triglyceride is over 400; calculations on Lipids are invalid.   HDL 46.50 07/27/2016   LDLDIRECT 124.0 07/27/2016   ALT 27 07/27/2016   AST 23 07/27/2016   NA 139 07/27/2016   K 3.8 07/27/2016   CL 101 07/27/2016   CREATININE 0.65 07/27/2016   BUN 9 07/27/2016   CO2 29 07/27/2016   TSH 2.07 07/27/2016   HGBA1C 5.7 07/27/2016    Dg Ribs  Unilateral Right  Result Date: 10/12/2016 CLINICAL DATA:  Fall with right rib pain EXAM: RIGHT RIBS - 2 VIEW COMPARISON:  None. FINDINGS: No fracture or other bone lesions are seen involving the ribs. IMPRESSION: No right-sided rib fracture. Electronically Signed   By: Deatra RobinsonKevin  Herman M.D.   On: 10/12/2016 17:44    Assessment & Plan:   Lavonna RuaSheri was seen today for follow-up.  Diagnoses and all orders for this visit:  Hypertriglyceridemia -     Lipid panel; Future  Acute serous otitis media of left ear, recurrence not specified -     cetirizine (ZYRTEC) 10 MG tablet; Take 1 tablet (10 mg total) by mouth at bedtime. -     cefdinir (OMNICEF) 300 MG capsule; Take 1 capsule (300 mg total) by mouth 2 (two) times daily.  Eustachian tube dysfunction, bilateral -     cetirizine (ZYRTEC) 10 MG tablet; Take 1 tablet (10 mg total) by mouth at bedtime. -     cefdinir (OMNICEF) 300 MG capsule; Take 1 capsule (300 mg total) by mouth 2 (two) times daily.   I have discontinued Ms. Vanhoesen's amoxicillin. I am also having her start on cetirizine and cefdinir. Additionally, I am having her maintain her Probiotic Product (ALIGN PO), Psyllium (METAMUCIL FIBER PO), Biotin, Black Cohosh, Multiple Vitamins-Minerals (MULTIVITAMIN ADULT PO), Digestive Enzymes (DIGESTIVE SUPPORT PO), citalopram, fenofibrate, omega-3 acid ethyl esters, and meloxicam.  Meds ordered this encounter  Medications  . cetirizine (ZYRTEC) 10 MG tablet    Sig: Take 1 tablet (10 mg total) by mouth at bedtime.    Dispense:  14 tablet    Refill:  0    Order Specific Question:   Supervising Provider    Answer:   Tresa GarterPLOTNIKOV, ALEKSEI V [[id number]]  . cefdinir (OMNICEF) 300 MG capsule    Sig: Take 1 capsule (300 mg total) by mouth 2 (two) times daily.    Dispense:  10 capsule    Refill:  0    Order Specific Question:   Supervising Provider    Answer:   Tresa GarterPLOTNIKOV, ALEKSEI V [[id number]]    Follow-up: Return in about 6 months (around 04/27/2017) for  hyperlipidemia.  Alysia Pennaharlotte Mikai Meints, NP

## 2016-11-10 DIAGNOSIS — Y999 Unspecified external cause status: Secondary | ICD-10-CM | POA: Insufficient documentation

## 2016-11-10 DIAGNOSIS — S52134A Nondisplaced fracture of neck of right radius, initial encounter for closed fracture: Secondary | ICD-10-CM | POA: Insufficient documentation

## 2016-11-10 DIAGNOSIS — Y92480 Sidewalk as the place of occurrence of the external cause: Secondary | ICD-10-CM | POA: Insufficient documentation

## 2016-11-10 DIAGNOSIS — W101XXA Fall (on)(from) sidewalk curb, initial encounter: Secondary | ICD-10-CM | POA: Insufficient documentation

## 2016-11-10 DIAGNOSIS — Z79899 Other long term (current) drug therapy: Secondary | ICD-10-CM | POA: Insufficient documentation

## 2016-11-10 DIAGNOSIS — Y9301 Activity, walking, marching and hiking: Secondary | ICD-10-CM | POA: Insufficient documentation

## 2016-11-11 ENCOUNTER — Encounter (HOSPITAL_COMMUNITY): Payer: Self-pay | Admitting: Emergency Medicine

## 2016-11-11 ENCOUNTER — Encounter: Payer: Self-pay | Admitting: Sports Medicine

## 2016-11-11 ENCOUNTER — Emergency Department (HOSPITAL_COMMUNITY): Payer: BLUE CROSS/BLUE SHIELD

## 2016-11-11 ENCOUNTER — Emergency Department (HOSPITAL_COMMUNITY)
Admission: EM | Admit: 2016-11-11 | Discharge: 2016-11-11 | Disposition: A | Discharge status: Home / Self Care | Payer: BLUE CROSS/BLUE SHIELD | Attending: Emergency Medicine | Admitting: Emergency Medicine

## 2016-11-11 ENCOUNTER — Ambulatory Visit (INDEPENDENT_AMBULATORY_CARE_PROVIDER_SITE_OTHER): Payer: BLUE CROSS/BLUE SHIELD | Admitting: Sports Medicine

## 2016-11-11 VITALS — BP 140/90 | HR 76 | Ht 69.0 in | Wt 222.4 lb

## 2016-11-11 DIAGNOSIS — S52134A Nondisplaced fracture of neck of right radius, initial encounter for closed fracture: Secondary | ICD-10-CM

## 2016-11-11 DIAGNOSIS — Z8739 Personal history of other diseases of the musculoskeletal system and connective tissue: Secondary | ICD-10-CM | POA: Diagnosis not present

## 2016-11-11 MED ORDER — ONDANSETRON 8 MG PO TBDP
8.0000 mg | ORAL_TABLET | Freq: Once | ORAL | Status: AC
  Administered 2016-11-11: 8 mg via ORAL
  Filled 2016-11-11: qty 1

## 2016-11-11 MED ORDER — HYDROMORPHONE HCL 1 MG/ML IJ SOLN
1.0000 mg | Freq: Once | INTRAMUSCULAR | Status: AC
  Administered 2016-11-11: 1 mg via INTRAMUSCULAR
  Filled 2016-11-11: qty 1

## 2016-11-11 MED ORDER — NAPROXEN-ESOMEPRAZOLE 500-20 MG PO TBEC: 1.0000 | DELAYED_RELEASE_TABLET | Freq: Two times a day (BID) | ORAL | 0 refills | Status: AC

## 2016-11-11 MED ORDER — NAPROXEN-ESOMEPRAZOLE 500-20 MG PO TBEC
1.0000 | DELAYED_RELEASE_TABLET | Freq: Two times a day (BID) | ORAL | 2 refills | Status: DC
Start: 2016-11-11 — End: 2016-11-23

## 2016-11-11 MED ORDER — OXYCODONE-ACETAMINOPHEN 5-325 MG PO TABS: 1.0000 | ORAL_TABLET | Freq: Four times a day (QID) | ORAL | 0 refills | Status: DC | PRN

## 2016-11-11 MED ORDER — HYDROCODONE-ACETAMINOPHEN 5-325 MG PO TABS: 1.0000 | ORAL_TABLET | Freq: Four times a day (QID) | ORAL | 0 refills | Status: DC | PRN

## 2016-11-11 NOTE — Discharge Instructions (Signed)
You need to discuss the fracture with your doctor in the next 2-3 days to see how long they want you to stay in the splint.  Sometimes these are removed after a few days and only a sling is used.

## 2016-11-11 NOTE — Progress Notes (Signed)
OFFICE VISIT NOTE Christina Huerta. Christina Huerta [facility] [phone]  Christina Huerta - 56 y.o. female MRN [id number]  Date of birth: Sep 23, 1960  Visit Date: 11/11/2016  PCP: Anne Ng, NP   Referred by: Anne Ng, NP  Clovis Cao, cma acting as scribe for Dr. Berline Chough.  SUBJECTIVE:   Chief Complaint  Patient presents with  . New Patient (Initial Visit)  . Hairline Fracture RT Elbow   HPI: As below and per problem based documentation when appropriate.   Christina Huerta reports with a RT arm injury. She states she tripped over a curb and landed on her outstretched right arm. There is pain in her wrist and elbow as well. The pain is worsened with movement and palpation. She has taken Tylenol with minimal relief. She denies any numbness, weakness, or tingling. She denies any other associated symptoms or other injuries. Xray completed on hand, forearm, elbow, and shoulder. Pt was treated with a brace and sling.    Review of Systems  Constitutional: Negative for chills, diaphoresis, fever, malaise/fatigue and weight loss.  HENT: Negative.   Eyes: Negative.   Respiratory: Negative.   Cardiovascular: Negative.   Gastrointestinal: Negative.   Genitourinary: Negative.   Musculoskeletal: Positive for falls and joint pain. Negative for back pain, myalgias and neck pain.  Skin: Negative for itching and rash.  Neurological: Negative.   Endo/Heme/Allergies: Negative for environmental allergies and polydipsia. Does not bruise/bleed easily.  Psychiatric/Behavioral: Negative.     Otherwise per HPI.  HISTORY & PERTINENT PRIOR DATA:  No specialty comments available. She reports that she has never smoked. She has never used smokeless tobacco.   Recent Labs  07/27/16 0934  HGBA1C 5.7   Medications & Allergies reviewed per EMR Patient Active Problem List   Diagnosis Date Noted  . Right knee pain 12/16/2016  . Fracture of radial neck,  right, closed 11/23/2016  . Hyperglycemia 07/26/2016  . Recurrent major depressive disorder (HCC) 07/26/2016  . Hx of rheumatoid arthritis 07/25/2016  . Allergic rhinitis 07/25/2016   Past Medical History:  Diagnosis Date  . Allergy   . Arthritis   . Asthma   . Depression   . IBS (irritable bowel syndrome)   . Kidney stone   . Migraines    Family History  Problem Relation Age of Onset  . Hyperlipidemia Mother   . Hyperlipidemia Father   . Heart disease Father   . Hypertension Father   . Kidney disease Father   . Arthritis Maternal Grandmother   . Hyperlipidemia Maternal Grandmother   . Cancer Maternal Grandfather        prostate cancer  . Hyperlipidemia Maternal Grandfather   . Arthritis Paternal Grandmother   . Hyperlipidemia Paternal Grandmother   . Cancer Paternal Grandfather        colon cancer  . Hyperlipidemia Paternal Grandfather   . Heart disease Paternal Grandfather   . Stroke Paternal Grandfather   . Hypertension Paternal Grandfather    Past Surgical History:  Procedure Laterality Date  . CESAREAN SECTION  1996  . scar neunoma surgery  1998   Social History   Occupational History  . Not on file.   Social History Main Topics  . Smoking status: Never Smoker  . Smokeless tobacco: Never Used  . Alcohol use Yes     Comment: social  . Drug use: No  . Sexual activity: Not on file    OBJECTIVE:  VS:  HT:5'  9" (175.3 cm)   WT:222 lb 6.4 oz (100.9 kg)  BMI:32.9    BP:140/90  HR:76bpm  TEMP: ( )  RESP:95 % EXAM: Findings:  WDWN, NAD, Non-toxic appearing Alert & appropriately interactive Not depressed or anxious appearing No increased work of breathing. Pupils are equal. EOM intact without nystagmus No clubbing or cyanosis of the extremities appreciated No significant rashes/lesions/ulcerations overlying the examined area. Radial pulses 2+/4.  No significant generalized UE edema. Sensation intact to light touch in upper extremities.  Right upper  extremity is  Significantly guarded and has a generalized amount of pain across the wrist, forearm, elbow and shoulder.  She has overall good cervical range of motion.  She has almost no flexion extension of the elbow.  Pain with grip strength as well as with attempted supination pronation.  Most focal pain is directly over the radial head.  No significant bruising or ecchymosis.     X-ray of her right elbow as well as shoulder and wrist reviewed that show a nondisplaced fracture of the radial neck ASSESSMENT & PLAN:     ICD-10-CM   1. Closed nondisplaced fracture of neck of right radius, initial encounter S52.134A oxyCODONE-acetaminophen (PERCOCET) 5-325 MG tablet    Naproxen-Esomeprazole (VIMOVO) 500-20 MG TBEC  2. Hx of rheumatoid arthritis Z87.39   ================================================================= Fracture of radial neck, right, closed This should be quite well with conservative measures and likely should heal completely but will need 6-8 weeks before planning to use this extensively.  Sling immobilization at this time with anti-inflammatories, pain medications and minimization of use.  Follow-up in 1 week for repeat evaluation and new x-rays to ensure no shift given the fracture location. =================================================================  Follow-up: Return in about 1 week (around 11/18/2016).   CMA/ATC served as Neurosurgeonscribe during this visit. History, Physical, and Plan performed by medical provider. Documentation and orders reviewed and attested to.      Gaspar BiddingMichael Rigby, DO    [facility]

## 2016-11-11 NOTE — ED Triage Notes (Signed)
Patient is complaining of right arm injury. Patient states that she tripped over a curb.

## 2016-11-11 NOTE — ED Provider Notes (Signed)
[facility]-EMERGENCY DEPT Provider Note   CSN: [id number] Arrival date & time: 11/10/16  2246     History   Chief Complaint Chief Complaint  Patient presents with  . Arm Injury    HPI Christina Huerta is a 56 y.o. female.  Patient presents to the emergency department with chief complaint of right arm injury. She states she tripped over a curb and landed on her outstretched right arm. She complains of pain in her wrist and elbow. The pain is worsened with movement and palpation. She has taken Tylenol with minimal relief. She denies any numbness, weakness, or tingling. She denies any other associated symptoms or other injuries.   The history is provided by the patient. No language interpreter was used.    Past Medical History:  Diagnosis Date  . Allergy   . Arthritis   . Asthma   . Depression   . IBS (irritable bowel syndrome)   . Kidney stone   . Migraines     Patient Active Problem List   Diagnosis Date Noted  . Hyperglycemia 07/26/2016  . Recurrent major depressive disorder (HCC) 07/26/2016  . Hx of rheumatoid arthritis 07/25/2016  . Allergic rhinitis 07/25/2016    Past Surgical History:  Procedure Laterality Date  . CESAREAN SECTION  1996  . scar neunoma surgery  1998    OB History    No data available       Home Medications    Prior to Admission medications   Medication Sig Start Date End Date Taking? Authorizing Provider  Biotin 4098110000 MCG TABS Take 10,000 mg by mouth 2 (two) times daily.    [provider]  Black Cohosh 80 MG CAPS Take 80 mg by mouth 1 day or 1 dose.    [provider]  cefdinir (OMNICEF) 300 MG capsule Take 1 capsule (300 mg total) by mouth 2 (two) times daily. 10/26/16   Nche, Bonna Gainsharlotte Lum, NP  cetirizine (ZYRTEC) 10 MG tablet Take 1 tablet (10 mg total) by mouth at bedtime. 10/26/16   Nche, Bonna Gainsharlotte Lum, NP  citalopram (CELEXA) 20 MG tablet Take 1 tablet (20 mg total) by mouth daily. 07/25/16   Nche, Bonna Gainsharlotte Lum, NP    Digestive Enzymes (DIGESTIVE SUPPORT PO) Take by mouth. IB Gard OTC    [provider]  fenofibrate (TRICOR) 145 MG tablet Take 1 tablet (145 mg total) by mouth daily. 07/28/16   Nche, Bonna Gainsharlotte Lum, NP  HYDROcodone-acetaminophen (NORCO/VICODIN) 5-325 MG tablet Take 1-2 tablets by mouth every 6 (six) hours as needed. 11/11/16   Roxy HorsemanBrowning, Grae Leathers, PA-C  meloxicam (MOBIC) 7.5 MG tablet Take 1 tablet (7.5 mg total) by mouth daily as needed for pain. 08/31/16   Nche, Bonna Gainsharlotte Lum, NP  Multiple Vitamins-Minerals (MULTIVITAMIN ADULT PO) Take by mouth.    [provider]  omega-3 acid ethyl esters (LOVAZA) 1 g capsule Take 2 capsules (2 g total) by mouth daily. 07/28/16   Nche, Bonna Gainsharlotte Lum, NP  Probiotic Product (ALIGN PO) Take by mouth 1 day or 1 dose.    [provider]  Psyllium (METAMUCIL FIBER PO) Take by mouth 1 day or 1 dose.    [provider]    Family History Family History  Problem Relation Age of Onset  . Hyperlipidemia Mother   . Hyperlipidemia Father   . Heart disease Father   . Hypertension Father   . Kidney disease Father   . Arthritis Maternal Grandmother   . Hyperlipidemia Maternal Grandmother   . Cancer  Maternal Grandfather        prostate cancer  . Hyperlipidemia Maternal Grandfather   . Arthritis Paternal Grandmother   . Hyperlipidemia Paternal Grandmother   . Cancer Paternal Grandfather        colon cancer  . Hyperlipidemia Paternal Grandfather   . Heart disease Paternal Grandfather   . Stroke Paternal Grandfather   . Hypertension Paternal Grandfather     Social History Social History  Substance Use Topics  . Smoking status: Never Smoker  . Smokeless tobacco: Never Used  . Alcohol use Yes     Comment: social     Allergies   Imitrex [sumatriptan] and Latex   Review of Systems Review of Systems  All other systems reviewed and are negative.    Physical Exam Updated Vital Signs BP (!) 153/106 (BP Location: Left Arm)    Pulse 82   Temp 98.2 F (36.8 C) (Oral)   Resp 20   Ht 5\' 9"  (1.753 m)   Wt 99.8 kg (220 lb)   SpO2 99%   BMI 32.49 kg/m   Physical Exam  Constitutional: She is oriented to person, place, and time. She appears well-developed and well-nourished.  HENT:  Head: Normocephalic and atraumatic.  Eyes: Pupils are equal, round, and reactive to light. Conjunctivae and EOM are normal.  Neck: Normal range of motion. Neck supple.  Cardiovascular: Normal rate and regular rhythm.  Exam reveals no gallop and no friction rub.   No murmur heard. Pulmonary/Chest: Effort normal and breath sounds normal. No respiratory distress. She has no wheezes. She has no rales. She exhibits no tenderness.  Abdominal: Soft. Bowel sounds are normal. She exhibits no distension and no mass. There is no tenderness. There is no rebound and no guarding.  Musculoskeletal: She exhibits tenderness. She exhibits no edema.  Right arm tender to palpation at the elbow over the radial aspect and also some tenderness on the radial aspect of the right wrist, no scaphoid tenderness, no bony abnormality or deformity, no skin tenting, range of motion and strength reduced secondary to pain  Neurological: She is alert and oriented to person, place, and time.  Skin: Skin is warm and dry.  Psychiatric: She has a normal mood and affect. Her behavior is normal. Judgment and thought content normal.  Nursing note and vitals reviewed.    ED Treatments / Results  Labs (all labs ordered are listed, but only abnormal results are displayed) Labs Reviewed - No data to display  EKG  EKG Interpretation None       Radiology Dg Shoulder Right  Result Date: 11/11/2016 CLINICAL DATA:  Tripped over a curb leaving work this evening. Right arm pain from the shoulder through the hand. EXAM: RIGHT SHOULDER - 2+ VIEW COMPARISON:  None. FINDINGS: There is no evidence of fracture or dislocation. Osteoarthritis of the acromioclavicular joint with  spurring. Subcortical cystic change of the lateral humeral head suggesting underlying rotator cuff pathology. Faint soft tissue calcification in the region of the rotator cuff insertion. IMPRESSION: 1. No acute fracture or subluxation of the right shoulder. 2. Acromioclavicular osteoarthritis. 3. Findings suggesting underlying rotator cuff pathology. Electronically Signed   By: Rubye Oaks M.D.   On: 11/11/2016 01:46   Dg Elbow Complete Right  Result Date: 11/11/2016 CLINICAL DATA:  Tripped over a curb leaving work this evening. Right arm pain from the shoulder through the hand. EXAM: RIGHT ELBOW - COMPLETE 3+ VIEW COMPARISON:  None. FINDINGS: Moderate joint effusion. There is a nondisplaced fracture  of the radial neck. No additional fracture of the elbow. Joint spaces are maintained. IMPRESSION: Nondisplaced radial neck fracture with moderate elbow joint effusion. Electronically Signed   By: Rubye Oaks M.D.   On: 11/11/2016 01:48   Dg Forearm Right  Result Date: 11/11/2016 CLINICAL DATA:  Tripped over a curb leaving work this evening. Right arm pain from the shoulder through the hand. EXAM: RIGHT FOREARM - 2 VIEW COMPARISON:  None. FINDINGS: Nondisplaced radial neck fracture. Distal radius is intact. The ulna is intact. There is no elbow joint effusion. Soft tissues are otherwise normal. IMPRESSION: Nondisplaced radial neck fracture. Remainder of the forearm is intact. Electronically Signed   By: Rubye Oaks M.D.   On: 11/11/2016 01:49   Dg Hand Complete Right  Result Date: 11/11/2016 CLINICAL DATA:  Tripped over a curb leaving work this evening. Right arm pain from the shoulder through the hand. EXAM: RIGHT HAND - COMPLETE 3+ VIEW COMPARISON:  None. FINDINGS: There is no evidence of fracture or dislocation. Chronic changes at the distal interphalangeal joints of the third, fourth, and fifth digits with possible Gull wing deformity. There is osteoarthritis of the thumb metacarpal  phalangeal joint on its interphalangeal joint. Soft tissues are unremarkable. IMPRESSION: 1. No acute fracture or dislocation of the right hand. 2. 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 Electronically Signed   By: Rubye Oaks M.D.   On: 11/11/2016 01:50    Procedures Procedures (including critical care time)  Medications Ordered in ED Medications  HYDROmorphone (DILAUDID) injection 1 mg (1 mg Intramuscular Given 11/11/16 0240)  ondansetron (ZOFRAN-ODT) disintegrating tablet 8 mg (8 mg Oral Given 11/11/16 0239)     Initial Impression / Assessment and Plan / ED Course  I have reviewed the triage vital signs and the nursing notes.  Pertinent labs & imaging results that were available during my care of the patient were reviewed by me and considered in my medical decision making (see chart for details).     Patient with right radial neck fracture after FOOSH.  Treated with a splint and sling. I discussed that occasionally these splints are removed after a few days to encourage mobilization. I have encouraged the patient to discuss this with her doctor later today. Will treat with pain medicine. PCP or orthopedic follow-up recommended.  Final Clinical Impressions(s) / ED Diagnoses   Final diagnoses:  Closed nondisplaced fracture of neck of right radius, initial encounter    New Prescriptions New Prescriptions   HYDROCODONE-ACETAMINOPHEN (NORCO/VICODIN) 5-325 MG TABLET    Take 1-2 tablets by mouth every 6 (six) hours as needed.     Roxy Horseman, PA-C 11/11/16 1610    Dione Booze, MD 11/11/16 [phone]

## 2016-11-23 ENCOUNTER — Encounter: Payer: Self-pay | Admitting: Sports Medicine

## 2016-11-23 ENCOUNTER — Ambulatory Visit (INDEPENDENT_AMBULATORY_CARE_PROVIDER_SITE_OTHER): Payer: BLUE CROSS/BLUE SHIELD

## 2016-11-23 ENCOUNTER — Ambulatory Visit (INDEPENDENT_AMBULATORY_CARE_PROVIDER_SITE_OTHER): Payer: BLUE CROSS/BLUE SHIELD | Admitting: Sports Medicine

## 2016-11-23 VITALS — BP 120/80 | HR 79 | Ht 69.0 in | Wt 222.0 lb

## 2016-11-23 DIAGNOSIS — M25521 Pain in right elbow: Secondary | ICD-10-CM

## 2016-11-23 DIAGNOSIS — S52124D Nondisplaced fracture of head of right radius, subsequent encounter for closed fracture with routine healing: Secondary | ICD-10-CM

## 2016-11-23 DIAGNOSIS — S52131A Displaced fracture of neck of right radius, initial encounter for closed fracture: Secondary | ICD-10-CM | POA: Insufficient documentation

## 2016-11-23 NOTE — Assessment & Plan Note (Signed)
Overall is healing quite well.  X-rays are reassuring.  Work on gentle range of motion.  Medications as needed OTC.  Frequent icing recommended.  2-week follow-up for clinical exam.  Will defer x-rays until 6-week follow-up if she continues to do well.

## 2016-11-23 NOTE — Progress Notes (Signed)
OFFICE VISIT NOTE Christina FellsMichael D. Christina Shinerigby, DO  [facility] [place] [phone]  Christina CornersSheri Huerta - 56 y.o. female MRN [id number]  Date of birth: [birth year]  Visit Date: 11/23/2016  PCP: Christina Huerta, Christina Lum, NP   Referred by: Christina Huerta, Christina Lum, NP  Clovis CaoAutumn Huerta, cma  acting as scribe for Dr. Berline Choughigby.  SUBJECTIVE:   Chief Complaint  Patient presents with  . Hairline Fracture RT Elbow   HPI: As below and per problem based documentation when appropriate.   Christina Huerta reports improvement since fall on 11/11/2016 in her RT elbow and hand. Has not use Oxycodone for pain. Tried Vimovo bid with no relief. Taking Advil every 4 hours with some relief. Wearing sling during the day as directed. She is able to open and close her hand better than before. There is mild tenderness in the radial side of arm. Noticed that she is unable to open a water bottle with her hand due weakness. She is doing the "spider wall crawling" daily with gradual improvement.     Review of Systems  Constitutional: Negative for chills, diaphoresis, fever, malaise/fatigue and weight loss.  HENT: Negative.   Eyes: Negative.   Respiratory: Negative.   Cardiovascular: Negative.   Gastrointestinal: Negative.   Genitourinary: Negative.   Musculoskeletal: Positive for joint pain and myalgias. Negative for back pain, falls and neck pain.  Skin: Negative.   Neurological: Positive for weakness.  Endo/Heme/Allergies: Negative for environmental allergies and polydipsia. Does not bruise/bleed easily.  Psychiatric/Behavioral: Negative.     Otherwise per HPI.  HISTORY & PERTINENT PRIOR DATA:  No specialty comments available. She reports that she has never smoked. She has never used smokeless tobacco.   Recent Labs  07/27/16 0934  HGBA1C 5.7   Medications & Allergies reviewed per EMR Patient Active Problem List   Diagnosis Date Noted  . Closed nondisplaced fracture of head of radius with  routine healing 11/23/2016  . Hyperglycemia 07/26/2016  . Recurrent major depressive disorder (HCC) 07/26/2016  . Hx of rheumatoid arthritis 07/25/2016  . Allergic rhinitis 07/25/2016   Past Medical History:  Diagnosis Date  . Allergy   . Arthritis   . Asthma   . Depression   . IBS (irritable bowel syndrome)   . Kidney stone   . Migraines    Family History  Problem Relation Age of Onset  . Hyperlipidemia Mother   . Hyperlipidemia Father   . Heart disease Father   . Hypertension Father   . Kidney disease Father   . Arthritis Maternal Grandmother   . Hyperlipidemia Maternal Grandmother   . Cancer Maternal Grandfather        prostate cancer  . Hyperlipidemia Maternal Grandfather   . Arthritis Paternal Grandmother   . Hyperlipidemia Paternal Grandmother   . Cancer Paternal Grandfather        colon cancer  . Hyperlipidemia Paternal Grandfather   . Heart disease Paternal Grandfather   . Stroke Paternal Grandfather   . Hypertension Paternal Grandfather    Past Surgical History:  Procedure Laterality Date  . CESAREAN SECTION  1996  . scar neunoma surgery  1998   Social History   Occupational History  . Not on file.   Social History Main Topics  . Smoking status: Never Smoker  . Smokeless tobacco: Never Used  . Alcohol use Yes     Comment: social  . Drug use: No  . Sexual activity: Not on file    OBJECTIVE:  VS:  HT:5\' 9"  (175.3 cm)   WT:222 lb (100.7 kg)  BMI:32.9    BP:120/80  HR:79bpm  TEMP: ( )  RESP:98 % EXAM: Findings:  Right upper extremity is overall well aligned.  She has no significant bruising this time.  There is only small amount of swelling in the hand and forearm.  She has pain with palpation of the interosseous membrane at the proximal third of the forearm.  Only minimal pain with resisted wrist extension.  Pain directly over the radial head as expected.  No pain along the medial epicondyle.  Her elbow range of motion is from 20degrees to 110  degrees.  Supination is lacking approximately 30 degrees with pronation lacking 20 degrees.  Good capillary refill.  Forearm compartments are soft.     Dg Shoulder Right  Result Date: 11/11/2016 CLINICAL DATA:  Tripped over a curb leaving work this evening. Right arm pain from the shoulder through the hand. EXAM: RIGHT SHOULDER - 2+ VIEW COMPARISON:  None. FINDINGS: There is no evidence of fracture or dislocation. Osteoarthritis of the acromioclavicular joint with spurring. Subcortical cystic change of the lateral humeral head suggesting underlying rotator cuff pathology. Faint soft tissue calcification in the region of the rotator cuff insertion. IMPRESSION: 1. No acute fracture or subluxation of the right shoulder. 2. Acromioclavicular osteoarthritis. 3. Findings suggesting underlying rotator cuff pathology. Electronically Signed   By: Rubye OaksMelanie  Ehinger M.D.   On: 11/11/2016 01:46   Dg Elbow Complete Right  Result Date: 11/11/2016 CLINICAL DATA:  Tripped over a curb leaving work this evening. Right arm pain from the shoulder through the hand. EXAM: RIGHT ELBOW - COMPLETE 3+ VIEW COMPARISON:  None. FINDINGS: Moderate joint effusion. There is a nondisplaced fracture of the radial neck. No additional fracture of the elbow. Joint spaces are maintained. IMPRESSION: Nondisplaced radial neck fracture with moderate elbow joint effusion. Electronically Signed   By: Rubye OaksMelanie  Ehinger M.D.   On: 11/11/2016 01:48   Dg Forearm Right  Result Date: 11/11/2016 CLINICAL DATA:  Tripped over a curb leaving work this evening. Right arm pain from the shoulder through the hand. EXAM: RIGHT FOREARM - 2 VIEW COMPARISON:  None. FINDINGS: Nondisplaced radial neck fracture. Distal radius is intact. The ulna is intact. There is no elbow joint effusion. Soft tissues are otherwise normal. IMPRESSION: Nondisplaced radial neck fracture. Remainder of the forearm is intact. Electronically Signed   By: Rubye OaksMelanie  Ehinger M.D.   On:  11/11/2016 01:49   Dg Hand Complete Right  Result Date: 11/11/2016 CLINICAL DATA:  Tripped over a curb leaving work this evening. Right arm pain from the shoulder through the hand. EXAM: RIGHT HAND - COMPLETE 3+ VIEW COMPARISON:  None. FINDINGS: There is no evidence of fracture or dislocation. Chronic changes at the distal interphalangeal joints of the third, fourth, and fifth digits with possible Gull wing deformity. There is osteoarthritis of the thumb metacarpal phalangeal joint on its interphalangeal joint. Soft tissues are unremarkable. IMPRESSION: 1. No acute fracture or dislocation of the right hand. 2. 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 Electronically Signed   By: Rubye OaksMelanie  Ehinger M.D.   On: 11/11/2016 01:50   ASSESSMENT & PLAN:     ICD-10-CM   1. Right elbow pain M25.521 DG Elbow 2 Views Right  2. Closed nondisplaced fracture of head of right radius with routine healing, subsequent encounter S52.124D   ================================================================= Closed nondisplaced fracture of head  of radius with routine healing Overall is healing quite well.  X-rays are reassuring.  Work on gentle range of motion.  Medications as needed OTC.  Frequent icing recommended.  2-week follow-up for clinical exam.  Will defer x-rays until 6-week follow-up if she continues to do well.  There are no Patient Instructions on file for this visit.================================================================= Follow-up: Return in about 2 weeks (around 12/07/2016).   CMA/ATC served as Neurosurgeon during this visit. History, Physical, and Plan performed by medical provider. Documentation and orders reviewed and attested to.      Gaspar Bidding, DO    [facility]

## 2016-12-07 ENCOUNTER — Encounter: Payer: Self-pay | Admitting: Sports Medicine

## 2016-12-07 ENCOUNTER — Ambulatory Visit (INDEPENDENT_AMBULATORY_CARE_PROVIDER_SITE_OTHER): Payer: BLUE CROSS/BLUE SHIELD | Admitting: Sports Medicine

## 2016-12-07 VITALS — BP 136/88 | HR 69 | Ht 69.0 in | Wt 221.4 lb

## 2016-12-07 DIAGNOSIS — S52124D Nondisplaced fracture of head of right radius, subsequent encounter for closed fracture with routine healing: Secondary | ICD-10-CM

## 2016-12-07 DIAGNOSIS — M25561 Pain in right knee: Secondary | ICD-10-CM | POA: Diagnosis not present

## 2016-12-07 NOTE — Progress Notes (Signed)
OFFICE VISIT NOTE Veverly FellsMichael D. Delorise Shinerigby, DO  [facility] [place] [phone]  Carney CornersSheri Huerta - 56 y.o. female MRN [id number]  Date of birth: 03-03-1961  Visit Date: 12/07/2016  PCP: Anne NgNche, Charlotte Lum, NP   Referred by: Anne NgNche, Charlotte Lum, NP  Orlie DakinBrandy Shelton, CMA acting as scribe for Dr. Berline Choughigby.  SUBJECTIVE:   Chief Complaint  Patient presents with  . Follow-up    nondisplaced fracture of the right elbow   HPI: As below and per problem based documentation when appropriate.  Christina Huerta is an established patient presenting today in follow-up of nondisplaced fracture of the right elbow s/p fall 11/11/16. At her last visit she was advised to ice the area frequently, continue taking Advil as needed, wear her sling, and work on gentle ROM exercsies.   Pt reports that she was doing pretty well until this past Sunday when she was cooking and tried to flip chicken. She heard a loud pop and felt extreme pain. She went to lay down and ice the elbow but felt aching pain all night long. When she woke up in the morning it wasn't her elbow that was hurting it was her right wrist. She has since heard that same popping sound in the right wrist. She has been taking Advil prn for the pain. She stopped using her splint after her last visit. She has been working on ROM. She still has slight decreased ROM and is still unable to completely straighten the arm. She is still noticing some weakness in the right arm.   She would also like to have her right knee checked while she is here, she stepped wrong and has been having pain in the knee since then. She has hx of injury to the left leg. She has been wearing a knee compression sleeve and using Arnicare gel.     Review of Systems  Constitutional: Negative for chills and fever.  Respiratory: Negative for shortness of breath and wheezing.   Cardiovascular: Negative for chest pain and palpitations.  Musculoskeletal: Positive for  falls (took a wrong step and having knee pain since) and joint pain.  Neurological: Negative for dizziness and headaches.  Endo/Heme/Allergies: Does not bruise/bleed easily.    Otherwise per HPI.  HISTORY & PERTINENT PRIOR DATA:  No specialty comments available. She reports that she has never smoked. She has never used smokeless tobacco.   Recent Labs  07/27/16 0934  HGBA1C 5.7   Medications & Allergies reviewed per EMR Patient Active Problem List   Diagnosis Date Noted  . Right knee pain 12/16/2016  . Fracture of radial neck, right, closed 11/23/2016  . Hyperglycemia 07/26/2016  . Recurrent major depressive disorder (HCC) 07/26/2016  . Hx of rheumatoid arthritis 07/25/2016  . Allergic rhinitis 07/25/2016   Past Medical History:  Diagnosis Date  . Allergy   . Arthritis   . Asthma   . Depression   . IBS (irritable bowel syndrome)   . Kidney stone   . Migraines    Family History  Problem Relation Age of Onset  . Hyperlipidemia Mother   . Hyperlipidemia Father   . Heart disease Father   . Hypertension Father   . Kidney disease Father   . Arthritis Maternal Grandmother   . Hyperlipidemia Maternal Grandmother   . Cancer Maternal Grandfather        prostate cancer  . Hyperlipidemia Maternal Grandfather   . Arthritis Paternal Grandmother   . Hyperlipidemia Paternal Grandmother   .  Cancer Paternal Grandfather        colon cancer  . Hyperlipidemia Paternal Grandfather   . Heart disease Paternal Grandfather   . Stroke Paternal Grandfather   . Hypertension Paternal Grandfather    Past Surgical History:  Procedure Laterality Date  . CESAREAN SECTION  1996  . scar neunoma surgery  1998   Social History   Occupational History  . Not on file.   Social History Main Topics  . Smoking status: Never Smoker  . Smokeless tobacco: Never Used  . Alcohol use Yes     Comment: social  . Drug use: No  . Sexual activity: Not on file    OBJECTIVE:  VS:  HT:5\' 9"  (175.3  cm)   WT:221 lb 6.4 oz (100.4 kg)  BMI:32.8    BP:136/88  HR:69bpm  TEMP: ( )  RESP:96 % EXAM: Findings:  Right elbow is overall well aligned with improved swelling.  She has limited flexion and extension by approximately 30 of flexion with 15 of extension.    Right knee is overall well aligned with no significant degenerative bossing.  She has generalized synovitis without effusion.  She is ligamentously stable but has pain over the lateral patella facet.  No significant mechanical symptoms with McMurray's or Thessaly.     Dg Elbow Complete Right (3+view)  Result Date: 12/08/2016 CLINICAL DATA:  History of radial neck fracture 1 month ago, followup, inability to extend upper extremity EXAM: RIGHT ELBOW - COMPLETE 3+ VIEW COMPARISON:  Right elbow films of 11/23/2016 FINDINGS: The nondisplaced fracture of the right radial neck is again noted and has not changed significant interval. No acute additional fracture is seen. A small joint effusion cannot be excluded. IMPRESSION: 1. No change in slightly impacted right radial neck fracture. 2. Cannot exclude small joint effusion.  No acute interval change. Electronically Signed   By: Dwyane Dee M.D.   On: 12/08/2016 10:02   Dg Knee Complete 4 Views Right  Result Date: 12/14/2016 CLINICAL DATA:  Severe, chronic right knee pain. The patient developed a popping sensation in the knee this morning. EXAM: RIGHT KNEE - COMPLETE 4+ VIEW COMPARISON:  None. FINDINGS: No evidence of fracture, dislocation, or joint effusion. Very mild appearing patellofemoral degenerative change is noted. Soft tissues are unremarkable. IMPRESSION: Very mild patellofemoral degenerative disease.  Otherwise negative. Electronically Signed   By: Drusilla Kanner M.D.   On: 12/14/2016 15:27   [place] Limited Joint Space Structures Low Right(no Linked Charges)  Result Date: 12/07/2016 Andrena Mews, DO     12/25/2016 11:31 PM PROCEDURE NOTE -  ULTRASOUND GUIDEDINJECTION: Right knee  Images were obtained and interpreted by myself, Gaspar Bidding, DO Images have been saved and stored to PACS system. Images obtained on: GE S7 Ultrasound machine  ULTRASOUND FINDINGS: Generalized synovitis with small amount of spurring and degeneration of the medial meniscus. DESCRIPTION OF PROCEDURE: The patient's clinical condition is marked by substantial pain and/or significant functional disability. Other conservative therapy has not provided relief, is contraindicated, or not appropriate. There is a reasonable likelihood that injection will significantly improve the patient's pain and/or functional impairment. After discussing the risks, benefits and expected outcomes of the injection and all questions were reviewed and answered, the patient wished to undergo the above named procedure. Verbal consent was obtained. The ultrasound was used to identify the target structure and adjacent neurovascular structures. The skin was then prepped in sterile fashion and the target structure was injected under direct visualization using sterile technique  as below: PREP: Alcohol, Ethel Chloride APPROACH: direct inplane, single injection, Superolateral,25g 1.5" needle INJECTATE: 2cc 1% lidocaine, 2cc 40mg  DepoMedrol ASPIRATE: N/A DRESSING: Band-Aid Post procedural instructions including recommending icing and warning signs for infection were reviewed. This procedure was well tolerated and there were no complications.  IMPRESSION: Succesful US Guided Injection    ASSESSMENT & PLAN:     ICD-10-CM   1. Closed nondisplaced fracture of head of right radius with routine healing, subsequent encounter S52.124D DG ELBOW COMPLETE RIGHT (3+VIEW)  2. 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  She  also has some  evidence of degenerative meniscal tears but these do not seem to be most symptomatic areas for her.  The lack of improvement can consider further advanced imaging with MRI.  Injection performed today.  Fracture of radial neck, right, closed This is consistent with this is consistent   With a healing fracture.   please see prior office visit notes for further plan but we will have her continue working on flexion and extension, supination and pronation. ================================================================= =================================================================  Follow-up: Return in about 6 weeks (around 01/18/2017).   CMA/ATC served as Neurosurgeon during this visit. History, Physical, and Plan performed by medical provider. Documentation and orders reviewed and attested to.      Gaspar Bidding, DO    [facility]

## 2016-12-08 ENCOUNTER — Ambulatory Visit (INDEPENDENT_AMBULATORY_CARE_PROVIDER_SITE_OTHER): Payer: BLUE CROSS/BLUE SHIELD

## 2016-12-08 ENCOUNTER — Ambulatory Visit: Payer: Self-pay

## 2016-12-08 ENCOUNTER — Telehealth: Payer: Self-pay | Admitting: Sports Medicine

## 2016-12-08 DIAGNOSIS — S52124D Nondisplaced fracture of head of right radius, subsequent encounter for closed fracture with routine healing: Secondary | ICD-10-CM

## 2016-12-08 NOTE — Patient Instructions (Signed)

## 2016-12-08 NOTE — Telephone Encounter (Signed)
Noted  

## 2016-12-08 NOTE — Telephone Encounter (Signed)
Left message to sche two week f/u w/ Berline Choughigby post visit on 08/08.  Ty,  -LL

## 2016-12-14 ENCOUNTER — Encounter: Payer: Self-pay | Admitting: Sports Medicine

## 2016-12-14 ENCOUNTER — Ambulatory Visit (INDEPENDENT_AMBULATORY_CARE_PROVIDER_SITE_OTHER): Payer: BLUE CROSS/BLUE SHIELD | Admitting: Sports Medicine

## 2016-12-14 ENCOUNTER — Ambulatory Visit (INDEPENDENT_AMBULATORY_CARE_PROVIDER_SITE_OTHER): Payer: BLUE CROSS/BLUE SHIELD

## 2016-12-14 VITALS — BP 138/98 | HR 74 | Ht 69.0 in | Wt 219.2 lb

## 2016-12-14 DIAGNOSIS — M25561 Pain in right knee: Secondary | ICD-10-CM | POA: Diagnosis not present

## 2016-12-14 NOTE — Progress Notes (Signed)
OFFICE VISIT NOTE Veverly FellsMichael D. Delorise Shinerigby, DO  [facility] [place] [phone]  Carney CornersSheri Michelle - 56 y.o. female MRN [id number]  Date of birth: 07/12/1960  Visit Date: 12/14/2016  PCP: Anne NgNche, Charlotte Lum, NP   Referred by: Anne NgNche, Charlotte Lum, NP  Orlie DakinBrandy Shelton, CMA acting as scribe for Dr. Berline Choughigby.  SUBJECTIVE:   Chief Complaint  Patient presents with  . Follow-up    right knee pain   HPI: As below and per problem based documentation when appropriate.  Mrs. Bruna Pottereague is an established patient presenting today in follow-up of right knee pain. She was last seen 12/07/2016 and was given u/s guided steroid injection.   Pt reports that Monday morning she was walking from the kitchen to the bathroom and all of a sudden she heard a pop in the right knee. Pain that she felt at that time was 10/10. She reports that it feels a little better than it did when it popped. She has been icing and elevated the knee. She has noticed some swelling in the knee. The pain seems to be centralized on the lateral aspect of the knee. She feels like everything just shifted forward. She feels like she is "throwing everything forward" where in the past it felt like someone was "pushing from behind". She is wearing the reaction knee brace. She now has to walk with a cane. She has very bad pain when bearing all weight on the right leg.     Review of Systems  Constitutional: Negative for chills and fever.  Respiratory: Negative for shortness of breath and wheezing.   Cardiovascular: Negative for chest pain and palpitations.  Musculoskeletal: Positive for joint pain. Negative for falls.  Neurological: Negative for dizziness, tingling and headaches.  Endo/Heme/Allergies: Does not bruise/bleed easily.    Otherwise per HPI.  HISTORY & PERTINENT PRIOR DATA:  No specialty comments available. She reports that she has never smoked. She has never used smokeless tobacco.   Recent Labs  07/27/16 0934  HGBA1C 5.7   Medications & Allergies reviewed per EMR Patient Active Problem List   Diagnosis Date Noted  . Right knee pain 12/16/2016  . Closed nondisplaced fracture of head of radius with routine healing 11/23/2016  . Hyperglycemia 07/26/2016  . Recurrent major depressive disorder (HCC) 07/26/2016  . Hx of rheumatoid arthritis 07/25/2016  . Allergic rhinitis 07/25/2016   Past Medical History:  Diagnosis Date  . Allergy   . Arthritis   . Asthma   . Depression   . IBS (irritable bowel syndrome)   . Kidney stone   . Migraines    Family History  Problem Relation Age of Onset  . Hyperlipidemia Mother   . Hyperlipidemia Father   . Heart disease Father   . Hypertension Father   . Kidney disease Father   . Arthritis Maternal Grandmother   . Hyperlipidemia Maternal Grandmother   . Cancer Maternal Grandfather        prostate cancer  . Hyperlipidemia Maternal Grandfather   . Arthritis Paternal Grandmother   . Hyperlipidemia Paternal Grandmother   . Cancer Paternal Grandfather        colon cancer  . Hyperlipidemia Paternal Grandfather   . Heart disease Paternal Grandfather   . Stroke Paternal Grandfather   . Hypertension Paternal Grandfather    Past Surgical History:  Procedure Laterality Date  . CESAREAN SECTION  1996  . scar neunoma surgery  1998   Social History   Occupational History  .  Not on file.   Social History Main Topics  . Smoking status: Never Smoker  . Smokeless tobacco: Never Used  . Alcohol use Yes     Comment: social  . Drug use: No  . Sexual activity: Not on file    OBJECTIVE:  VS:  HT:5\' 9"  (175.3 cm)   WT:219 lb 3.2 oz (99.4 kg)  BMI:32.4    BP:(!) 138/98  HR:74bpm  TEMP: ( )  RESP:96 % EXAM: Findings:  Right knee is overall well aligned.  She has marked pain with any type of active or passive flexion extension.  She has a mild to moderate effusion is non-tense.  She is ligamentously stable.  She has pain with  McMurray's and is unable to perform [facility].  Extensor mechanism is intact.  Both medial and lateral joint line pain.     Dg Elbow 2 Views Right  Result Date: 11/23/2016 CLINICAL DATA:  Follow-up slightly impacted radial head fracture. EXAM: RIGHT ELBOW - 2 VIEW COMPARISON:  Elbow series of November 11, 2016 FINDINGS: There remains subtle cortical deformity of the junction of the neck with the head of the proximal radius. There is minimal periosteal reaction. The adjacent ulna and distal humerus are intact. A small joint effusion remains. IMPRESSION: Ongoing but incomplete healing of the fracture at the base of the radial head with its junction with the neck. Electronically Signed   By: David  [place] M.D.   On: 11/23/2016 16:44   Dg Elbow Complete Right (3+view)  Result Date: 12/08/2016 CLINICAL DATA:  History of radial neck fracture 1 month ago, followup, inability to extend upper extremity EXAM: RIGHT ELBOW - COMPLETE 3+ VIEW COMPARISON:  Right elbow films of 11/23/2016 FINDINGS: The nondisplaced fracture of the right radial neck is again noted and has not changed significant interval. No acute additional fracture is seen. A small joint effusion cannot be excluded. IMPRESSION: 1. No change in slightly impacted right radial neck fracture. 2. Cannot exclude small joint effusion.  No acute interval change. Electronically Signed   By: Dwyane Dee M.D.   On: 12/08/2016 10:02   Dg Knee Complete 4 Views Right  Result Date: 12/14/2016 CLINICAL DATA:  Severe, chronic right knee pain. The patient developed a popping sensation in the knee this morning. EXAM: RIGHT KNEE - COMPLETE 4+ VIEW COMPARISON:  None. FINDINGS: No evidence of fracture, dislocation, or joint effusion. Very mild appearing patellofemoral degenerative change is noted. Soft tissues are unremarkable. IMPRESSION: Very mild patellofemoral degenerative disease.  Otherwise negative. Electronically Signed   By: Drusilla Kanner M.D.   On: 12/14/2016 15:27    ASSESSMENT & PLAN:     ICD-10-CM   1. Right knee pain, unspecified chronicity M25.561 DG Knee Complete 4 Views Right    MR Knee Right Wo Contrast  ================================================================= Right knee pain Persistent ongoing symptoms with actual worsening of symptoms following injection.  We will plan to obtain further diagnostic evaluation with an MRI.  There is a questionable area along the lateral notch that correlates with underlying patellofemoral wear that may be more symptomatic than initially believed.  She is currently unable to walk without a cane for assistance and has failed conservative measures. =================================================================  Follow-up: Return for MRI review.   CMA/ATC served as Neurosurgeon during this visit. History, Physical, and Plan performed by medical provider. Documentation and orders reviewed and attested to.      Gaspar Bidding, DO    [facility]

## 2016-12-14 NOTE — Patient Instructions (Signed)

## 2016-12-16 DIAGNOSIS — M25561 Pain in right knee: Secondary | ICD-10-CM | POA: Insufficient documentation

## 2016-12-16 NOTE — Assessment & Plan Note (Signed)
Persistent ongoing symptoms with actual worsening of symptoms following injection.  We will plan to obtain further diagnostic evaluation with an MRI.  There is a questionable area along the lateral notch that correlates with underlying patellofemoral wear that may be more symptomatic than initially believed.  She is currently unable to walk without a cane for assistance and has failed conservative measures.

## 2016-12-21 NOTE — Assessment & Plan Note (Signed)
This should be quite well with conservative measures and likely should heal completely but will need 6-8 weeks before planning to use this extensively.  Sling immobilization at this time with anti-inflammatories, pain medications and minimization of use.  Follow-up in 1 week for repeat evaluation and new x-rays to ensure no shift given the fracture location.

## 2016-12-25 NOTE — Procedures (Signed)
PROCEDURE NOTE -  ULTRASOUND GUIDEDINJECTION: Right knee Images were obtained and interpreted by myself, Gaspar Bidding, DO  Images have been saved and stored to PACS system. Images obtained on: GE S7 Ultrasound machine  ULTRASOUND FINDINGS: Generalized synovitis with small amount of spurring and degeneration of the medial meniscus.  DESCRIPTION OF PROCEDURE:  The patient's clinical condition is marked by substantial pain and/or significant functional disability. Other conservative therapy has not provided relief, is contraindicated, or not appropriate. There is a reasonable likelihood that injection will significantly improve the patient's pain and/or functional impairment. After discussing the risks, benefits and expected outcomes of the injection and all questions were reviewed and answered, the patient wished to undergo the above named procedure. Verbal consent was obtained. The ultrasound was used to identify the target structure and adjacent neurovascular structures. The skin was then prepped in sterile fashion and the target structure was injected under direct visualization using sterile technique as below: PREP: Alcohol, Ethel Chloride APPROACH: direct inplane, single injection, Superolateral,25g 1.5" needle INJECTATE: 2cc 1% lidocaine, 2cc 40mg  DepoMedrol ASPIRATE: N/A DRESSING: Band-Aid   Post procedural instructions including recommending icing and warning signs for infection were reviewed. This procedure was well tolerated and there were no complications.   IMPRESSION: Succesful US Guided Injection

## 2016-12-25 NOTE — Assessment & Plan Note (Signed)
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  She also has some  evidence of degenerative meniscal tears but these do not seem to be most symptomatic areas for her.  The lack of improvement can consider further advanced imaging with MRI.  Injection performed today.

## 2016-12-25 NOTE — Assessment & Plan Note (Signed)
This is consistent with this is consistent   With a healing fracture.   please see prior office visit notes for further plan but we will have her continue working on flexion and extension, supination and pronation.

## 2016-12-31 ENCOUNTER — Other Ambulatory Visit: Payer: Self-pay

## 2017-01-04 ENCOUNTER — Ambulatory Visit: Payer: BLUE CROSS/BLUE SHIELD | Admitting: Sports Medicine

## 2017-01-18 ENCOUNTER — Telehealth: Payer: Self-pay | Admitting: Sports Medicine

## 2017-01-18 NOTE — Telephone Encounter (Signed)
6 months

## 2017-01-18 NOTE — Telephone Encounter (Signed)
Patient's husband called to get the patient's handicap sticker extended for a longer time for her knee pain. I explained that the patient may be asked to come into the office for a follow up appointment first and it may not be available when he comes in the office for his appointment today at 4 for PT.   Call patient to notify if an appointment is needed before the extension is approved or denied.

## 2017-01-18 NOTE — Telephone Encounter (Signed)
Lauren will give him form at his appointment today.

## 2017-01-18 NOTE — Telephone Encounter (Signed)
Last OV 12/14/16, forwarding to Dr. Berline Chough to advise.

## 2017-01-26 ENCOUNTER — Ambulatory Visit: Payer: BLUE CROSS/BLUE SHIELD | Admitting: Nurse Practitioner

## 2017-02-15 ENCOUNTER — Other Ambulatory Visit: Payer: Self-pay | Admitting: Nurse Practitioner

## 2017-02-15 DIAGNOSIS — F339 Major depressive disorder, recurrent, unspecified: Secondary | ICD-10-CM

## 2017-03-03 ENCOUNTER — Encounter: Payer: Self-pay | Admitting: Sports Medicine

## 2017-03-03 ENCOUNTER — Telehealth: Payer: Self-pay | Admitting: Sports Medicine

## 2017-03-03 ENCOUNTER — Ambulatory Visit (INDEPENDENT_AMBULATORY_CARE_PROVIDER_SITE_OTHER): Payer: Managed Care, Other (non HMO) | Admitting: Nurse Practitioner

## 2017-03-03 ENCOUNTER — Encounter: Payer: Self-pay | Admitting: Nurse Practitioner

## 2017-03-03 ENCOUNTER — Other Ambulatory Visit (INDEPENDENT_AMBULATORY_CARE_PROVIDER_SITE_OTHER): Payer: Managed Care, Other (non HMO)

## 2017-03-03 VITALS — BP 136/88 | HR 78 | Temp 97.7°F | Ht 69.0 in | Wt 227.0 lb

## 2017-03-03 DIAGNOSIS — F339 Major depressive disorder, recurrent, unspecified: Secondary | ICD-10-CM | POA: Diagnosis not present

## 2017-03-03 DIAGNOSIS — R300 Dysuria: Secondary | ICD-10-CM

## 2017-03-03 DIAGNOSIS — E781 Pure hyperglyceridemia: Secondary | ICD-10-CM

## 2017-03-03 DIAGNOSIS — M25561 Pain in right knee: Secondary | ICD-10-CM | POA: Diagnosis not present

## 2017-03-03 DIAGNOSIS — M25461 Effusion, right knee: Secondary | ICD-10-CM

## 2017-03-03 HISTORY — DX: Pure hyperglyceridemia: E78.1

## 2017-03-03 LAB — HEPATIC FUNCTION PANEL
ALBUMIN: 4.4 g/dL (ref 3.5–5.2)
ALT: 26 U/L (ref 0–35)
AST: 18 U/L (ref 0–37)
Alkaline Phosphatase: 73 U/L (ref 39–117)
BILIRUBIN TOTAL: 0.5 mg/dL (ref 0.2–1.2)
Bilirubin, Direct: 0.1 mg/dL (ref 0.0–0.3)
Total Protein: 7.4 g/dL (ref 6.0–8.3)

## 2017-03-03 LAB — POCT URINALYSIS DIPSTICK
BILIRUBIN UA: NEGATIVE
Blood, UA: NEGATIVE
GLUCOSE UA: NEGATIVE
KETONES UA: NEGATIVE
LEUKOCYTES UA: NEGATIVE
Nitrite, UA: NEGATIVE
PH UA: 6.5 (ref 5.0–8.0)
Protein, UA: NEGATIVE
Spec Grav, UA: 1.02 (ref 1.010–1.025)
Urobilinogen, UA: 0.2 E.U./dL

## 2017-03-03 LAB — LIPID PANEL
CHOL/HDL RATIO: 6
Cholesterol: 275 mg/dL — ABNORMAL HIGH (ref 0–200)
HDL: 49.8 mg/dL (ref >39.00)
NONHDL: 224.96
TRIGLYCERIDES: 258 mg/dL — AB (ref 0.0–149.0)
VLDL: 51.6 mg/dL — AB (ref 0.0–40.0)

## 2017-03-03 LAB — LDL CHOLESTEROL, DIRECT: Direct LDL: 169 mg/dL

## 2017-03-03 MED ORDER — OXYCODONE-ACETAMINOPHEN 5-325 MG PO TABS: 1.0000 | ORAL_TABLET | Freq: Three times a day (TID) | ORAL | 0 refills | Status: DC | PRN

## 2017-03-03 MED ORDER — OMEGA-3-ACID ETHYL ESTERS 1 G PO CAPS: 2.0000 g | ORAL_CAPSULE | Freq: Every day | ORAL | 1 refills | Status: DC

## 2017-03-03 MED ORDER — CITALOPRAM HYDROBROMIDE 20 MG PO TABS: 20.0000 mg | ORAL_TABLET | Freq: Every day | ORAL | 1 refills | Status: DC

## 2017-03-03 MED ORDER — NAPROXEN 500 MG PO TABS: 500.0000 mg | ORAL_TABLET | Freq: Two times a day (BID) | ORAL | 0 refills | Status: DC | PRN

## 2017-03-03 MED ORDER — FENOFIBRATE 145 MG PO TABS: 145.0000 mg | ORAL_TABLET | Freq: Every day | ORAL | 1 refills | Status: DC

## 2017-03-03 NOTE — Telephone Encounter (Signed)
Patient calling to inform that her insurance has changed, and after seeing PCP, would like to try and get MRI completed on her knee. Pt states she was unable to afford it previously but should be able to with new insurance.  Unsure if another office visit is needed first- [facility].  Please advise.

## 2017-03-03 NOTE — Telephone Encounter (Signed)
Additional information is needed. OV [facility]

## 2017-03-03 NOTE — Progress Notes (Signed)
Subjective:  Patient ID: Christina Huerta, female    DOB: August 01, 1960  Age: 56 y.o. MRN: [id number]  CC: Follow-up (6 mo fu-fasting); Knee Pain (right knee pain,swelling, weird noises-yesterday); and Urinary Tract Infection (dysuria and frequent urinate)   HPI Hyperlipidemia: No adverse effects with tricor and fish oil.  Recurrent right knee pain and swelling: Followed by sports medicine. Waxing and waning. Got worse yesterday  Depression: Mood is stable with celexa. Denies need to change medication of dosage at this time. [facility] 03/03/2017 07/25/2016  Decreased Interest 0 0  Down, Depressed, Hopeless 1 0  PHQ - 2 Score 1 0  Altered sleeping 2 -  Tired, decreased energy 2 -  Change in appetite 3 -  Feeling bad or failure about yourself  1 -  Trouble concentrating 0 -  Moving slowly or fidgety/restless 0 -  Suicidal thoughts 0 -  PHQ-9 Score 9 -   Outpatient Medications Prior to Visit  Medication Sig Dispense Refill  . Biotin 4098110000 MCG TABS Take 10,000 mg by mouth 2 (two) times daily.    . Black Cohosh 80 MG CAPS Take 80 mg by mouth 1 day or 1 dose.    . cetirizine (ZYRTEC) 10 MG tablet Take 1 tablet (10 mg total) by mouth at bedtime. 14 tablet 0  . Digestive Enzymes (DIGESTIVE SUPPORT PO) Take by mouth. IB Gard OTC    . Multiple Vitamins-Minerals (MULTIVITAMIN ADULT PO) Take by mouth.    . Probiotic Product (ALIGN PO) Take by mouth 1 day or 1 dose.    . Psyllium (METAMUCIL FIBER PO) Take by mouth 1 day or 1 dose.    . citalopram (CELEXA) 20 MG tablet take 1 tablet by mouth once daily 90 tablet 0  . fenofibrate (TRICOR) 145 MG tablet Take 1 tablet (145 mg total) by mouth daily. 90 tablet 1  . omega-3 acid ethyl esters (LOVAZA) 1 g capsule Take 2 capsules (2 g total) by mouth daily. 180 capsule 1  . oxyCODONE-acetaminophen (PERCOCET) 5-325 MG tablet Take 1 tablet by mouth every 6 (six) hours as needed for severe pain. 20 tablet 0   No facility-administered  medications prior to visit.     ROS See HPI  Objective:  BP 136/88   Pulse 78   Temp 97.7 F (36.5 C)   Ht 5\' 9"  (1.753 m)   Wt 227 lb (103 kg)   SpO2 94%   BMI 33.52 kg/m   BP Readings from Last 3 Encounters:  03/03/17 136/88  12/14/16 (!) 138/98  12/07/16 136/88    Wt Readings from Last 3 Encounters:  03/03/17 227 lb (103 kg)  12/14/16 219 lb 3.2 oz (99.4 kg)  12/07/16 221 lb 6.4 oz (100.4 kg)    Physical Exam  Constitutional: She is oriented to person, place, and time. No distress.  Cardiovascular: Normal rate.   Pulmonary/Chest: Effort normal.  Musculoskeletal: She exhibits edema and tenderness. She exhibits no deformity.       Right knee: She exhibits swelling. She exhibits normal range of motion, no erythema and normal patellar mobility. No medial joint line, no lateral joint line and no patellar tendon tenderness noted.  Right posterior knee tenderness.   Neurological: She is alert and oriented to person, place, and time.  Skin: Skin is warm and dry. No rash noted. No erythema.  Psychiatric: She has a normal mood and affect. Her behavior is normal.  Vitals reviewed.   Lab Results  Component Value Date  GLUCOSE 98 07/27/2016   CHOL 275 (H) 03/03/2017   TRIG 258.0 (H) 03/03/2017   HDL 49.80 03/03/2017   LDLDIRECT 169.0 03/03/2017   ALT 26 03/03/2017   AST 18 03/03/2017   NA 139 07/27/2016   K 3.8 07/27/2016   CL 101 07/27/2016   CREATININE 0.65 07/27/2016   BUN 9 07/27/2016   CO2 29 07/27/2016   TSH 2.07 07/27/2016   HGBA1C 5.7 07/27/2016    Dg Shoulder Right  Result Date: 11/11/2016 CLINICAL DATA:  Tripped over a curb leaving work this evening. Right arm pain from the shoulder through the hand. EXAM: RIGHT SHOULDER - 2+ VIEW COMPARISON:  None. FINDINGS: There is no evidence of fracture or dislocation. Osteoarthritis of the acromioclavicular joint with spurring. Subcortical cystic change of the lateral humeral head suggesting underlying rotator  cuff pathology. Faint soft tissue calcification in the region of the rotator cuff insertion. IMPRESSION: 1. No acute fracture or subluxation of the right shoulder. 2. Acromioclavicular osteoarthritis. 3. Findings suggesting underlying rotator cuff pathology. Electronically Signed   By: Rubye Oaks M.D.   On: 11/11/2016 01:46   Dg Elbow Complete Right  Result Date: 11/11/2016 CLINICAL DATA:  Tripped over a curb leaving work this evening. Right arm pain from the shoulder through the hand. EXAM: RIGHT ELBOW - COMPLETE 3+ VIEW COMPARISON:  None. FINDINGS: Moderate joint effusion. There is a nondisplaced fracture of the radial neck. No additional fracture of the elbow. Joint spaces are maintained. IMPRESSION: Nondisplaced radial neck fracture with moderate elbow joint effusion. Electronically Signed   By: Rubye Oaks M.D.   On: 11/11/2016 01:48   Dg Forearm Right  Result Date: 11/11/2016 CLINICAL DATA:  Tripped over a curb leaving work this evening. Right arm pain from the shoulder through the hand. EXAM: RIGHT FOREARM - 2 VIEW COMPARISON:  None. FINDINGS: Nondisplaced radial neck fracture. Distal radius is intact. The ulna is intact. There is no elbow joint effusion. Soft tissues are otherwise normal. IMPRESSION: Nondisplaced radial neck fracture. Remainder of the forearm is intact. Electronically Signed   By: Rubye Oaks M.D.   On: 11/11/2016 01:49   Dg Hand Complete Right  Result Date: 11/11/2016 CLINICAL DATA:  Tripped over a curb leaving work this evening. Right arm pain from the shoulder through the hand. EXAM: RIGHT HAND - COMPLETE 3+ VIEW COMPARISON:  None. FINDINGS: There is no evidence of fracture or dislocation. Chronic changes at the distal interphalangeal joints of the third, fourth, and fifth digits with possible Gull wing deformity. There is osteoarthritis of the thumb metacarpal phalangeal joint on its interphalangeal joint. Soft tissues are unremarkable. IMPRESSION: 1. No acute  fracture or dislocation of the right hand. 2. 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 Electronically Signed   By: Rubye Oaks M.D.   On: 11/11/2016 01:50    Assessment & Plan:   Carmelina was seen today for follow-up, knee pain and urinary tract infection.  Diagnoses and all orders for this visit:  Hypertriglyceridemia -     Lipid panel; Future -     Hepatic function panel; Future -     fenofibrate (TRICOR) 145 MG tablet; Take 1 tablet (145 mg total) by mouth daily. -     omega-3 acid ethyl esters (LOVAZA) 1 g capsule; Take 2 capsules (2 g total) by mouth daily.  Pain and swelling of knee, right -     naproxen (NAPROSYN)  500 MG tablet; Take 1 tablet (500 mg total) by mouth 2 (two) times daily as needed (for pain, take with food). -     oxyCODONE-acetaminophen (PERCOCET) 5-325 MG tablet; Take 1 tablet by mouth every 8 (eight) hours as needed for severe pain.  Dysuria -     POCT urinalysis dipstick -     Urine Culture; Future  Recurrent major depressive disorder, remission status unspecified (HCC) -     citalopram (CELEXA) 20 MG tablet; Take 1 tablet (20 mg total) by mouth daily.   I have changed Ms. Craw's oxyCODONE-acetaminophen and citalopram. I am also having her start on naproxen. Additionally, I am having her maintain her Probiotic Product (ALIGN PO), Psyllium (METAMUCIL FIBER PO), Biotin, Black Cohosh, Multiple Vitamins-Minerals (MULTIVITAMIN ADULT PO), Digestive Enzymes (DIGESTIVE SUPPORT PO), cetirizine, diazepam, fenofibrate, and omega-3 acid ethyl esters.  Meds ordered this encounter  Medications  . diazepam (VALIUM) 5 MG tablet    Sig: take 1 tablet by mouth three times a day as directed    Refill:  0  . naproxen (NAPROSYN) 500 MG tablet    Sig: Take 1 tablet (500 mg total) by mouth 2 (two) times daily as needed (for pain, take with food).     Dispense:  10 tablet    Refill:  0    Order Specific Question:   Supervising Provider    Answer:   Tresa Garter [[id number]]  . oxyCODONE-acetaminophen (PERCOCET) 5-325 MG tablet    Sig: Take 1 tablet by mouth every 8 (eight) hours as needed for severe pain.    Dispense:  6 tablet    Refill:  0    Order Specific Question:   Supervising Provider    Answer:   Tresa Garter [[id number]]  . citalopram (CELEXA) 20 MG tablet    Sig: Take 1 tablet (20 mg total) by mouth daily.    Dispense:  90 tablet    Refill:  1    Order Specific Question:   Supervising Provider    Answer:   Tresa Garter [[id number]]  . fenofibrate (TRICOR) 145 MG tablet    Sig: Take 1 tablet (145 mg total) by mouth daily.    Dispense:  90 tablet    Refill:  1    Order Specific Question:   Supervising Provider    Answer:   Tresa Garter [[id number]]  . omega-3 acid ethyl esters (LOVAZA) 1 g capsule    Sig: Take 2 capsules (2 g total) by mouth daily.    Dispense:  180 capsule    Refill:  1    Order Specific Question:   Supervising Provider    Answer:   Tresa Garter [[id number]]    Follow-up: Return in about 3 months (around 06/03/2017) for CPE (fasting)(need repeat labs and PAP smear).  [name], NP

## 2017-03-03 NOTE — Patient Instructions (Addendum)
Please get mammogram and colonoscopy records.  Lipid panel indicates improved triglycerides, and total cholesterol. Normal liver function. Need to continue use of tricor and fish oil. Also need to maintain low fat/low carb diet and regular exercise. Follow up in 3months for CPE (fasting).  Please follow up with sports medicine about right knee pain.  Urinalysis is normal. Urine is sent for culture.

## 2017-03-03 NOTE — Assessment & Plan Note (Signed)
Waxing and waning mood. Current use of celexa. She does not want medication adjustment of change at this time.

## 2017-03-03 NOTE — Telephone Encounter (Signed)
PA has been initiated.

## 2017-03-03 NOTE — Assessment & Plan Note (Signed)
Lipid panel indicates improved triglycerides, and total cholesterol. Normal liver function. Need to continue use of tricor and fish oil. Also need to maintain low fat/low carb diet and regular exercise. Consider switching tricor to atorvastatin once triglyceride is <250.

## 2017-03-04 LAB — URINE CULTURE
MICRO NUMBER: 81232807
Result:: NO GROWTH
SPECIMEN QUALITY: ADEQUATE

## 2017-03-06 NOTE — Telephone Encounter (Signed)
Patient called in reference to notes below. Informed patient of note [facility]. Patient voiced understanding. Patient stated she needs the bill for knee brace she received re-filed because now she only has [employer] insurance. Please advise.

## 2017-03-07 ENCOUNTER — Other Ambulatory Visit: Payer: Self-pay

## 2017-03-07 DIAGNOSIS — M25561 Pain in right knee: Principal | ICD-10-CM

## 2017-03-07 DIAGNOSIS — G8929 Other chronic pain: Secondary | ICD-10-CM

## 2017-03-07 NOTE — Telephone Encounter (Signed)
MRI has been approved. [facility]. Called pt and left VM to call the office. [facility] at [phone] to schedule her MRI.

## 2017-03-08 NOTE — Telephone Encounter (Signed)
Pt made aware by Hailey.

## 2017-03-15 ENCOUNTER — Ambulatory Visit
Admission: RE | Admit: 2017-03-15 | Discharge: 2017-03-15 | Disposition: A | Discharge status: Home / Self Care | Payer: Managed Care, Other (non HMO) | Source: Ambulatory Visit | Attending: Sports Medicine | Admitting: Sports Medicine

## 2017-03-15 DIAGNOSIS — M25561 Pain in right knee: Principal | ICD-10-CM

## 2017-03-15 DIAGNOSIS — G8929 Other chronic pain: Secondary | ICD-10-CM

## 2017-03-21 ENCOUNTER — Telehealth: Payer: Self-pay | Admitting: Sports Medicine

## 2017-03-21 NOTE — Telephone Encounter (Signed)
Pt needs OV to discuss results and treatment options. Called pt and left VM to call the office.

## 2017-03-21 NOTE — Telephone Encounter (Signed)
Patient called in reference to MRI results. Patient stated she saw results on MY chart but had questions about results. Patient would like to know if surgery is needed or what needs to be done next. Please call patient and advise. OK to leave detailed message.

## 2017-03-22 ENCOUNTER — Telehealth: Payer: Self-pay | Admitting: Sports Medicine

## 2017-03-22 NOTE — Telephone Encounter (Signed)
See telephone note from 03/2017. 

## 2017-03-22 NOTE — Telephone Encounter (Signed)
Noted.  Thanks team for getting pt scheduled for a f/u visit. -MW

## 2017-03-22 NOTE — Telephone Encounter (Signed)
Patient called and was highly upset that the nurse/clinical staff did not leave a detailed message on her phone and they kept missing each others call.   I advised the patient that I can assist her because there was a detailed message left for me to get her scheduled for an MRI review appointment. Patient has been scheduled No further action required. -Pauline AusKara Evans [facility](Front Office Team Lead)

## 2017-03-22 NOTE — Telephone Encounter (Signed)
Patient's husband called in reference to patient having MRI on knee. Patient would like to know next step. Please call patient and advise. OK to leave message.

## 2017-03-22 NOTE — Telephone Encounter (Signed)
Called pt and LM again that she needs to schedule a f/u visit to discuss her MRI results. - MW

## 2017-03-28 ENCOUNTER — Ambulatory Visit: Payer: Managed Care, Other (non HMO) | Admitting: Sports Medicine

## 2017-03-28 ENCOUNTER — Encounter: Payer: Self-pay | Admitting: Sports Medicine

## 2017-03-28 VITALS — BP 132/86 | HR 80 | Ht 69.0 in | Wt 228.6 lb

## 2017-03-28 DIAGNOSIS — S838X1S Sprain of other specified parts of right knee, sequela: Secondary | ICD-10-CM | POA: Insufficient documentation

## 2017-03-28 DIAGNOSIS — G8929 Other chronic pain: Secondary | ICD-10-CM

## 2017-03-28 DIAGNOSIS — M1711 Unilateral primary osteoarthritis, right knee: Secondary | ICD-10-CM | POA: Diagnosis not present

## 2017-03-28 DIAGNOSIS — M25561 Pain in right knee: Secondary | ICD-10-CM | POA: Diagnosis not present

## 2017-03-28 HISTORY — DX: Sprain of other specified parts of right knee, sequela: S83.8X1S

## 2017-03-28 MED ORDER — TRAMADOL HCL 50 MG PO TABS: 50.0000 mg | ORAL_TABLET | Freq: Four times a day (QID) | ORAL | 0 refills | Status: DC | PRN

## 2017-03-28 NOTE — Progress Notes (Signed)
Christina FellsMichael D. Christina Huerta, Christina Huerta  Christina CornersSheri Huerta - 56 y.o. female MRN [id number]  Date of birth: [birth year]   Scribe for today's visit: Christina Huerta, Christina Huerta    SUBJECTIVE:  Christina Huerta is here for Follow-up (R knee pain) .   Compared to the last office visit on 12/14/16, her previously described R knee symptoms are worsening.  Pt states that she is experiencing increased pain, popping/clicking and swelling. Current symptoms are 4-5/10 and 9-10/10 at it's worst  & are radiating to the R lower leg when she experiences the crunching sensation. She has been using Tramadol left over from a prior R arm injury and some 800 mg IBU.    ROS Denies night time disturbances. Denies fevers, chills, or night sweats. Denies unexplained weight loss. Denies personal history of cancer. Reports changes in bowel or bladder habits. Denies recent unreported falls. Denies new or worsening dyspnea or wheezing. Reports headaches or dizziness.  Reports numbness, tingling or weakness  In the extremities.  Denies dizziness or presyncopal episodes Reports lower extremity edema    HISTORY & PERTINENT PRIOR DATA:  Prior History reviewed and updated per electronic medical record. Significant history, findings, studies and interim changes include: Findings:  MRI right knee: 03/15/2017: Meniscal root avulsion of the medial meniscus with tricompartmental degenerative changes that are mild to moderate. Referred to Dr. Roda ShuttersXu  Patient sustained a fall on 11/11/2016 resulting in a nondisplaced fracture of the neck of the right radius    reports that  has never smoked. she has never used smokeless tobacco. Recent Labs    07/27/16 0934  HGBA1C 5.7   Problem  Meniscal Injury, Right, Sequela     OBJECTIVE:  VS:  HT:5\' 9"  (175.3 cm)   WT:228 lb 9.6 oz (103.7 kg)  BMI:33.74    BP:132/86  HR:80bpm  TEMP: ( )  RESP:96 %  PHYSICAL  EXAM: Constitutional: WDWN, Non-toxic appearing. Psychiatric: Alert & appropriately interactive.Not depressed or anxious appearing. Respiratory: No increased work of breathing. Trachea Midline Eyes: Pupils are equal. EOM intact without nystagmus. No scleral icterus   VASCULAR FINDINGS: Blank multiple: No clubbing or cyanosis appreciated Capillary Refill is normal, less than 2 seconds  LOWER EXTREMITIES: No significant LE venous stasis changes, No significant pretibial/lower extremity peripheral edema and Bilateral Pedal Pulses: symmetrically palpable  SENSORY FINDINGS: LOWER EXTREMITIES: Intact to light touch in all examined dermatomes  Right knee: Overall well aligned.  She has no significant effusion.  Generalized TTP most focally over the medial joint line.  Stable to anterior and posterior drawer and varus and valgus strain.  Crepitation and clicking with McMurray's.  She has pain with Thessaly.  No significant extensor lag   ASSESSMENT & PLAN:   1. Chronic pain of right knee   2. Meniscal injury, right, sequela   3. Primary osteoarthritis of right knee    Plan: Knee pain is worsening and she continues to have significant day-to-day limitations including using a cane in spite of prior injection.  She did lose her insurance for a brief period and did not undergo MRI until recently.   Given the findings Ortho referral for consideration of arthroscopic intervention.  We did discuss that this may not provide her complete relief given the underlying osteoarthritis but given the extensive synovitis as well as degenerative meniscal changes debridement may provide adequate relief of her current symptoms.  She does understand that total knee arthroplasty may be something that  is required in the future.   She is interested in trying to get this procedure done prior to the end of the year. Meds refilled as below  ++++++++++++++++++++++++++++++++++++++++++++ Orders:  Orders Placed This  Encounter  Procedures  . Ambulatory referral to Orthopedic Surgery    Meds:  Meds ordered this encounter  Medications  . DISCONTD: traMADol (ULTRAM) 50 MG tablet    Sig: Take 1 tablet (50 mg total) by mouth every 6 (six) hours as needed for moderate pain.    Dispense:  30 tablet    Refill:  0  . traMADol (ULTRAM) 50 MG tablet    Sig: Take 1 tablet (50 mg total) by mouth every 6 (six) hours as needed for moderate pain.    Dispense:  30 tablet    Refill:  0    ++++++++++++++++++++++++++++++++++++++++++++ Follow-up: Return if symptoms worsen or fail to improve.   Pertinent documentation may be included in additional procedure notes, imaging studies, problem based documentation and patient instructions. Please see these sections of the encounter for additional information regarding this visit. CMA/Christina Huerta served as Neurosurgeonscribe during this visit. History, Physical, and Plan performed by medical provider. Documentation and orders reviewed and attested to.      Christina Huerta, Christina Huerta    Christina Huerta Sports Medicine Physician

## 2017-03-28 NOTE — Patient Instructions (Signed)
I am referring you to Dr. Roda ShuttersXu at [facility]  Address: [address], [place], [place] [address] Phone: [phone]

## 2017-03-30 ENCOUNTER — Ambulatory Visit: Payer: Managed Care, Other (non HMO) | Admitting: Sports Medicine

## 2017-03-30 ENCOUNTER — Encounter (INDEPENDENT_AMBULATORY_CARE_PROVIDER_SITE_OTHER): Payer: Self-pay | Admitting: Orthopaedic Surgery

## 2017-03-30 ENCOUNTER — Telehealth: Payer: Self-pay

## 2017-03-30 ENCOUNTER — Ambulatory Visit (INDEPENDENT_AMBULATORY_CARE_PROVIDER_SITE_OTHER): Payer: Managed Care, Other (non HMO) | Admitting: Orthopaedic Surgery

## 2017-03-30 DIAGNOSIS — G8929 Other chronic pain: Secondary | ICD-10-CM

## 2017-03-30 DIAGNOSIS — M25561 Pain in right knee: Secondary | ICD-10-CM

## 2017-03-30 MED ORDER — LIDOCAINE HCL 1 % IJ SOLN
2.0000 mL | INTRAMUSCULAR | Status: AC | PRN
  Administered 2017-03-30: 2 mL

## 2017-03-30 MED ORDER — BUPIVACAINE HCL 0.5 % IJ SOLN
2.0000 mL | INTRAMUSCULAR | Status: AC | PRN
  Administered 2017-03-30: 2 mL via INTRA_ARTICULAR

## 2017-03-30 MED ORDER — METHYLPREDNISOLONE ACETATE 40 MG/ML IJ SUSP
40.0000 mg | INTRAMUSCULAR | Status: AC | PRN
  Administered 2017-03-30: 40 mg via INTRA_ARTICULAR

## 2017-03-30 NOTE — Telephone Encounter (Signed)
Called pt and advised that note has been faxed.
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
## 2017-03-30 NOTE — Telephone Encounter (Signed)
Copied from CRM [phone]. Topic: Inquiry >> Mar 30, 2017  8:15 AM Landry MellowFoltz, Melissa J wrote: Reason for CRM: pt lost her doctor note from 03/28/17 She is requesting that we fax a copy of the note to [phone]. Attn: Sharl MaKayla Barker

## 2017-03-30 NOTE — Telephone Encounter (Signed)
Letter faxed to Sharl MaKayla Barker at [phone].

## 2017-04-12 ENCOUNTER — Ambulatory Visit: Payer: BLUE CROSS/BLUE SHIELD | Admitting: Nurse Practitioner

## 2017-04-13 ENCOUNTER — Telehealth: Payer: Self-pay | Admitting: Nurse Practitioner

## 2017-04-13 DIAGNOSIS — Z124 Encounter for screening for malignant neoplasm of cervix: Secondary | ICD-10-CM

## 2017-04-13 NOTE — Telephone Encounter (Signed)
Pt stated she had colposcopy and had biopsy done in the past. Now Dr. Orvan Falconerampbell stated they need to removed more but this time it is going be more in depth--surgery. Pt wants to see someone locally instead of driving down to [place].

## 2017-04-13 NOTE — Telephone Encounter (Signed)
Please advise, okey for referral or you wants to see her first?   Copied from CRM [phone]. Topic: Referral - Request >> Apr 13, 2017 10:46 AM Eston Mouldavis, Cheri B wrote: Reason for CRM: Pt says shes a pt of Alysia PennaCharlotte Nche and needs referral  to obygn because she    Needs chunks removed from Cervix

## 2017-04-14 ENCOUNTER — Telehealth (INDEPENDENT_AMBULATORY_CARE_PROVIDER_SITE_OTHER): Payer: Self-pay | Admitting: Orthopaedic Surgery

## 2017-04-14 ENCOUNTER — Ambulatory Visit (INDEPENDENT_AMBULATORY_CARE_PROVIDER_SITE_OTHER): Payer: Managed Care, Other (non HMO) | Admitting: Orthopaedic Surgery

## 2017-04-14 ENCOUNTER — Encounter: Payer: Self-pay | Admitting: Nurse Practitioner

## 2017-04-14 DIAGNOSIS — M94261 Chondromalacia, right knee: Secondary | ICD-10-CM

## 2017-04-14 DIAGNOSIS — M23303 Other meniscus derangements, unspecified medial meniscus, right knee: Secondary | ICD-10-CM

## 2017-04-14 NOTE — Telephone Encounter (Signed)
Spoke with patient scheduled appointment with Dr Roda ShuttersXu

## 2017-04-14 NOTE — Telephone Encounter (Signed)
I would like her to come back to see me first and talk more in depth about surgery.

## 2017-04-14 NOTE — Telephone Encounter (Signed)
Patient would like to go ahead and schedule the right knee surgery before the end of the month. Her insurance will end on the 31st and she needed it done asap.

## 2017-04-14 NOTE — Progress Notes (Signed)
Office Visit Note   Patient: Carney CornersSheri Watlington           Date of Birth: 09-10-1960           MRN: [id number] Visit Date: 04/14/2017              Requested by: Anne NgNche, Charlotte Lum, NP [address] [place], [place] [address] PCP: Anne NgNche, Charlotte Lum, NP   Assessment & Plan: Visit Diagnoses:  1. Derangement of medial meniscus of right knee   2. Chondromalacia, right knee     Plan: Impression is right knee medial meniscus derangement and chondromalacia with cystic changes.  I recommend arthroscopic evaluation and debridement as indicated.  She understands the risk benefits alternatives to surgery including infection, DVT, incomplete relief of pain.  Anticipate out of work for up to 6 weeks.  Follow-Up Instructions: Return for 2 week postop visit.   Orders:  No orders of the defined types were placed in this encounter.  No orders of the defined types were placed in this encounter.     Procedures: No procedures performed   Clinical Data: No additional findings.   Subjective: No chief complaint on file.   Patient follows up today for pain.  She had from the previous cortisone injection which we performed approximately 3 weeks ago.  She is requesting arthroscopic surgery as we had previously discussed.  She denies any numbness and tingling.  She continues to have pain that radiates from the posterior aspect of the knee into the anterior aspect of the knee.    Review of Systems  Constitutional: Negative.   HENT: Negative.   Eyes: Negative.   Respiratory: Negative.   Cardiovascular: Negative.   Endocrine: Negative.   Musculoskeletal: Negative.   Neurological: Negative.   Hematological: Negative.   Psychiatric/Behavioral: Negative.   All other systems reviewed and are negative.    Objective: Vital Signs: There were no vitals taken for this visit.  Physical Exam  Constitutional: She is oriented to person, place, and time. She appears well-developed and well-nourished.    Pulmonary/Chest: Effort normal.  Neurological: She is alert and oriented to person, place, and time.  Skin: Skin is warm. Capillary refill takes less than 2 seconds.  Psychiatric: She has a normal mood and affect. Her behavior is normal. Judgment and thought content normal.  Nursing note and vitals reviewed.   Ortho Exam Right knee exam shows a small effusion.  Right knee exam is stable Specialty Comments:  No specialty comments available.  Imaging: No results found.   PMFS History: Patient Active Problem List   Diagnosis Date Noted  . 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 Chondromalacia, right knee 04/14/2017  . Meniscal injury, right, sequela 03/28/2017  . Hypertriglyceridemia 03/03/2017  . Right knee pain 12/16/2016  . Fracture of radial neck, right, closed 11/23/2016  . Hyperglycemia 07/26/2016  . Recurrent major depressive disorder (HCC) 07/26/2016  . Hx of rheumatoid arthritis 07/25/2016  . Allergic rhinitis 07/25/2016   Past Medical History:  Diagnosis Date  . Allergy   . Arthritis   . Asthma   . Depression   . Hypertriglyceridemia 03/03/2017  . IBS (irritable bowel syndrome)   . Kidney stone   . Migraines     Family History  Problem Relation Age of Onset  . Hyperlipidemia Mother   . Hyperlipidemia Father   . Heart disease Father   . Hypertension Father   . Kidney disease Father   . Arthritis Maternal Grandmother   .  Hyperlipidemia Maternal Grandmother   . Cancer Maternal Grandfather        prostate cancer  . Hyperlipidemia Maternal Grandfather   . Arthritis Paternal Grandmother   . Hyperlipidemia Paternal Grandmother   . Cancer Paternal Grandfather        colon cancer  . Hyperlipidemia Paternal Grandfather   . Heart disease Paternal Grandfather   . Stroke Paternal Grandfather   . Hypertension Paternal Grandfather     Past Surgical History:  Procedure Laterality Date  . CESAREAN SECTION  1996  . scar neunoma surgery  1998    Social History   Occupational History  . Not on file  Tobacco Use  . Smoking status: Never Smoker  . Smokeless tobacco: Never Used  Substance and Sexual Activity  . Alcohol use: Yes    Comment: social  . Drug use: No  . Sexual activity: Not on file

## 2017-04-21 ENCOUNTER — Encounter (INDEPENDENT_AMBULATORY_CARE_PROVIDER_SITE_OTHER): Payer: Self-pay | Admitting: Orthopaedic Surgery

## 2017-04-21 DIAGNOSIS — M94261 Chondromalacia, right knee: Secondary | ICD-10-CM | POA: Diagnosis not present

## 2017-04-21 DIAGNOSIS — M23303 Other meniscus derangements, unspecified medial meniscus, right knee: Secondary | ICD-10-CM | POA: Diagnosis not present

## 2017-04-21 DIAGNOSIS — M659 Synovitis and tenosynovitis, unspecified: Secondary | ICD-10-CM | POA: Diagnosis not present

## 2017-04-26 ENCOUNTER — Telehealth (INDEPENDENT_AMBULATORY_CARE_PROVIDER_SITE_OTHER): Payer: Self-pay | Admitting: Radiology

## 2017-04-26 NOTE — Telephone Encounter (Signed)
Patient called and says she is doing the HEP, and her knee is popping, she is concerned, please call her to discuss.

## 2017-04-27 NOTE — Telephone Encounter (Signed)
See message below °

## 2017-04-27 NOTE — Telephone Encounter (Signed)
Spoke with her on phone and addressed her concerns

## 2017-05-01 ENCOUNTER — Ambulatory Visit (INDEPENDENT_AMBULATORY_CARE_PROVIDER_SITE_OTHER): Payer: Managed Care, Other (non HMO) | Admitting: Orthopaedic Surgery

## 2017-05-01 ENCOUNTER — Encounter (INDEPENDENT_AMBULATORY_CARE_PROVIDER_SITE_OTHER): Payer: Self-pay | Admitting: Orthopaedic Surgery

## 2017-05-01 DIAGNOSIS — M94261 Chondromalacia, right knee: Secondary | ICD-10-CM

## 2017-05-01 NOTE — Progress Notes (Signed)
Patient ID: Christina CornersSheri Lykens, female   DOB: 10-18-1960, 56 y.o.   MRN: [id number]  Patient is 2 weeks status post right knee arthroscopy and debridements.  She feels much better overall.  However she is still ambulating with crutches.  She does endorse some tenderness and numbness around his.  She is taking ibuprofen and oxycodone.  Her incisions have healed without complication.  There is no signs of infection or drainage.  No significant swelling.  Some light bruising.  Her calf is nontender.  Range of motion is well-preserved.  Arthroscopy pictures were reviewed with the patient which shows mainly degenerative changes.  At this point we will referred to outpatient physical therapy for quad strengthening home exercises.  Follow-up in 4 weeks for recheck.

## 2017-05-08 ENCOUNTER — Ambulatory Visit: Payer: Self-pay

## 2017-05-08 ENCOUNTER — Ambulatory Visit: Payer: Managed Care, Other (non HMO) | Admitting: Nurse Practitioner

## 2017-05-08 ENCOUNTER — Encounter: Payer: Self-pay | Admitting: Nurse Practitioner

## 2017-05-08 VITALS — BP 134/82 | HR 92 | Temp 98.1°F | Ht 69.0 in | Wt 223.0 lb

## 2017-05-08 DIAGNOSIS — J029 Acute pharyngitis, unspecified: Secondary | ICD-10-CM | POA: Diagnosis not present

## 2017-05-08 DIAGNOSIS — R6889 Other general symptoms and signs: Secondary | ICD-10-CM | POA: Diagnosis not present

## 2017-05-08 LAB — POCT RAPID STREP A (OFFICE): RAPID STREP A SCREEN: NEGATIVE

## 2017-05-08 LAB — POCT INFLUENZA A/B
INFLUENZA A, POC: NEGATIVE
Influenza B, POC: NEGATIVE

## 2017-05-08 MED ORDER — AMOXICILLIN 875 MG PO TABS: 875.0000 mg | ORAL_TABLET | Freq: Two times a day (BID) | ORAL | 0 refills | Status: DC

## 2017-05-08 MED ORDER — FLUTICASONE PROPIONATE 50 MCG/ACT NA SUSP: 2.0000 | Freq: Every day | NASAL | 0 refills | Status: DC

## 2017-05-08 MED ORDER — BENZONATATE 100 MG PO CAPS: 100.0000 mg | ORAL_CAPSULE | Freq: Three times a day (TID) | ORAL | 0 refills | Status: DC | PRN

## 2017-05-08 MED ORDER — OSELTAMIVIR PHOSPHATE 75 MG PO CAPS: 75.0000 mg | ORAL_CAPSULE | Freq: Two times a day (BID) | ORAL | 0 refills | Status: DC

## 2017-05-08 MED ORDER — OXYMETAZOLINE HCL 0.05 % NA SOLN
1.0000 | Freq: Two times a day (BID) | NASAL | 0 refills | Status: DC
Start: 2017-05-08 — End: 2017-06-19

## 2017-05-08 MED ORDER — ALBUTEROL SULFATE HFA 108 (90 BASE) MCG/ACT IN AERS: 1.0000 | INHALATION_SPRAY | Freq: Four times a day (QID) | RESPIRATORY_TRACT | 0 refills | Status: DC | PRN

## 2017-05-08 MED ORDER — GUAIFENESIN ER 600 MG PO TB12: 600.0000 mg | ORAL_TABLET | Freq: Two times a day (BID) | ORAL | 0 refills | Status: DC | PRN

## 2017-05-08 NOTE — Progress Notes (Signed)
Subjective:  Patient ID: Christina Huerta, female    DOB: Jul 03, 1960  Age: 57 y.o. MRN: [id number]  CC: Sore Throat (sore throat,ear full and pain---coughing up yellow mucus mix with blood 1 time---1 day)   URI   This is a new problem. The current episode started yesterday. The problem has been unchanged. There has been no fever. Associated symptoms include chest pain, congestion, coughing, ear pain, headaches, a plugged ear sensation, rhinorrhea, sinus pain and a sore throat. Pertinent negatives include no abdominal pain or wheezing. She has tried acetaminophen and increased fluids for the symptoms. The treatment provided no relief.  husband diagnosed with strep throat and pneumonia.  Outpatient Medications Prior to Visit  Medication Sig Dispense Refill  . Biotin 40981 MCG TABS Take 10,000 mg by mouth 2 (two) times daily.    . Black Cohosh 80 MG CAPS Take 80 mg by mouth 1 day or 1 dose.    . cetirizine (ZYRTEC) 10 MG tablet Take 1 tablet (10 mg total) by mouth at bedtime. 14 tablet 0  . citalopram (CELEXA) 20 MG tablet Take 1 tablet (20 mg total) by mouth daily. 90 tablet 1  . diazepam (VALIUM) 5 MG tablet take 1 tablet by mouth three times a day as directed  0  . Digestive Enzymes (DIGESTIVE SUPPORT PO) Take by mouth. IB Gard OTC    . fenofibrate (TRICOR) 145 MG tablet Take 1 tablet (145 mg total) by mouth daily. 90 tablet 1  . Multiple Vitamins-Minerals (MULTIVITAMIN ADULT PO) Take by mouth.    . omega-3 acid ethyl esters (LOVAZA) 1 g capsule Take 2 capsules (2 g total) by mouth daily. 180 capsule 1  . Probiotic Product (ALIGN PO) Take by mouth 1 day or 1 dose.    . naproxen (NAPROSYN) 500 MG tablet Take 1 tablet (500 mg total) by mouth 2 (two) times daily as needed (for pain, take with food). (Patient not taking: Reported on 05/08/2017) 10 tablet 0  . oxyCODONE-acetaminophen (PERCOCET) 5-325 MG tablet Take 1 tablet by mouth every 8 (eight) hours as needed for severe pain. (Patient not taking:  Reported on 05/08/2017) 6 tablet 0  . Psyllium (METAMUCIL FIBER PO) Take by mouth 1 day or 1 dose.    . traMADol (ULTRAM) 50 MG tablet Take 1 tablet (50 mg total) by mouth every 6 (six) hours as needed for moderate pain. (Patient not taking: Reported on 05/08/2017) 30 tablet 0   No facility-administered medications prior to visit.     ROS See HPI  Objective:  BP 134/82   Pulse 92   Temp 98.1 F (36.7 C)   Ht 5\' 9"  (1.753 m)   Wt 223 lb (101.2 kg)   SpO2 96%   BMI 32.93 kg/m   BP Readings from Last 3 Encounters:  05/08/17 134/82  03/28/17 132/86  03/03/17 136/88    Wt Readings from Last 3 Encounters:  05/08/17 223 lb (101.2 kg)  03/28/17 228 lb 9.6 oz (103.7 kg)  03/03/17 227 lb (103 kg)    Physical Exam  Constitutional: She is oriented to person, place, and time.  HENT:  Right Ear: External ear and ear canal normal. Tympanic membrane is injected. A middle ear effusion is present.  Left Ear: External ear and ear canal normal. Tympanic membrane is injected. A middle ear effusion is present.  Nose: Mucosal edema and rhinorrhea present. Right sinus exhibits maxillary sinus tenderness. Right sinus exhibits no frontal sinus tenderness. Left sinus exhibits maxillary sinus tenderness.  Left sinus exhibits no frontal sinus tenderness.  Mouth/Throat: Uvula is midline. No trismus in the jaw. Posterior oropharyngeal erythema present. No oropharyngeal exudate.  Eyes: No scleral icterus.  Neck: Normal range of motion. Neck supple.  Cardiovascular: Normal rate and normal heart sounds.  Pulmonary/Chest: Effort normal and breath sounds normal.  Musculoskeletal: She exhibits no edema.  Lymphadenopathy:    She has no cervical adenopathy.  Neurological: She is alert and oriented to person, place, and time.  Vitals reviewed.   Lab Results  Component Value Date   GLUCOSE 98 07/27/2016   CHOL 275 (H) 03/03/2017   TRIG 258.0 (H) 03/03/2017   HDL 49.80 03/03/2017   LDLDIRECT 169.0  03/03/2017   ALT 26 03/03/2017   AST 18 03/03/2017   NA 139 07/27/2016   K 3.8 07/27/2016   CL 101 07/27/2016   CREATININE 0.65 07/27/2016   BUN 9 07/27/2016   CO2 29 07/27/2016   TSH 2.07 07/27/2016   HGBA1C 5.7 07/27/2016    Mr Knee Right Wo Contrast  Result Date: 03/15/2017 CLINICAL DATA:  Chronic right knee pain since a fall in July 2018. EXAM: MRI OF THE RIGHT KNEE WITHOUT CONTRAST TECHNIQUE: Multiplanar, multisequence MR imaging of the knee was performed. No intravenous contrast was administered. COMPARISON:  Radiographs dated 12/14/2016 FINDINGS: MENISCI Medial meniscus: There is a complete radial tear of the root of the posterior horn and 10 trick degeneration of the posterior horn the midbody within the far peripheral undersurface longitudinal tear best seen on series 8. Lateral meniscus: Minimal blunting of the free edge of the midbody. Otherwise normal. LIGAMENTS Cruciates:  Intact ACL and PCL. Collaterals: Medial collateral ligament is intact. Lateral collateral ligament complex is intact. CARTILAGE Patellofemoral: Small focal areas of full-thickness cartilage loss of the medial and lateral facets of the patella. Medial: Small areas of partial-thickness cartilage loss on the medial femoral condyle. Lateral: Small focal area of partial-thickness cartilage loss of the posterior aspect of the lateral tibial plateau. Joint:  Moderate joint effusion. Popliteal Fossa: 15 x 15 x 55 mm Baker's cyst. Intact popliteus tendon. Multiple small ganglion cysts posterior to the posterior cruciate ligament. Extensor Mechanism:  Normal. Bones: Small focal area of subcortical edema at the posterior peripheral aspect of the medial femoral condyle probably secondary to overlying cartilage loss. Other: None IMPRESSION: 1. Complete radial tear of the root of the posterior horn of the medial meniscus with secondary peripheral subluxation of the meniscus. 2. Small peripheral undersurface tear of the posterior  horn and midbody of the medial meniscus. 3. Tricompartmental cartilage defects as described. 4. Moderate effusion with small to moderate Baker's cyst. Electronically Signed   By: Francene Boyers M.D.   On: 03/15/2017 09:34    Assessment & Plan:   Caleen was seen today for sore throat.  Diagnoses and all orders for this visit:  Flu-like symptoms -     POCT Influenza A/B -     oseltamivir (TAMIFLU) 75 MG capsule; Take 1 capsule (75 mg total) by mouth 2 (two) times daily. -     benzonatate (TESSALON) 100 MG capsule; Take 1 capsule (100 mg total) by mouth 3 (three) times daily as needed for cough. -     albuterol (PROVENTIL HFA;VENTOLIN HFA) 108 (90 Base) MCG/ACT inhaler; Inhale 1-2 puffs into the lungs every 6 (six) hours as needed. -     guaiFENesin (MUCINEX) 600 MG 12 hr tablet; Take 1 tablet (600 mg total) by mouth 2 (two) times daily as needed  for cough or to loosen phlegm. -     fluticasone (FLONASE) 50 MCG/ACT nasal spray; Place 2 sprays into both nostrils daily. -     oxymetazoline (AFRIN NASAL SPRAY) 0.05 % nasal spray; Place 1 spray into both nostrils 2 (two) times daily. Use only for 3days, then stop  Sore throat -     POCT rapid strep A -     amoxicillin (AMOXIL) 875 MG tablet; Take 1 tablet (875 mg total) by mouth 2 (two) times daily.   I am having Christina Huerta start on oseltamivir, benzonatate, albuterol, guaiFENesin, fluticasone, oxymetazoline, and amoxicillin. I am also having her maintain her Probiotic Product (ALIGN PO), Psyllium (METAMUCIL FIBER PO), Biotin, Black Cohosh, Multiple Vitamins-Minerals (MULTIVITAMIN ADULT PO), Digestive Enzymes (DIGESTIVE SUPPORT PO), cetirizine, diazepam, naproxen, oxyCODONE-acetaminophen, citalopram, fenofibrate, omega-3 acid ethyl esters, and traMADol.  Meds ordered this encounter  Medications  . oseltamivir (TAMIFLU) 75 MG capsule    Sig: Take 1 capsule (75 mg total) by mouth 2 (two) times daily.    Dispense:  10 capsule    Refill:  0     Order Specific Question:   Supervising Provider    Answer:   Dianne DunARON, TALIA M [[id number]]  . benzonatate (TESSALON) 100 MG capsule    Sig: Take 1 capsule (100 mg total) by mouth 3 (three) times daily as needed for cough.    Dispense:  20 capsule    Refill:  0    Order Specific Question:   Supervising Provider    Answer:   Dianne DunARON, TALIA M [[id number]]  . albuterol (PROVENTIL HFA;VENTOLIN HFA) 108 (90 Base) MCG/ACT inhaler    Sig: Inhale 1-2 puffs into the lungs every 6 (six) hours as needed.    Dispense:  1 Inhaler    Refill:  0    Order Specific Question:   Supervising Provider    Answer:   Dianne DunARON, TALIA M [[id number]]  . guaiFENesin (MUCINEX) 600 MG 12 hr tablet    Sig: Take 1 tablet (600 mg total) by mouth 2 (two) times daily as needed for cough or to loosen phlegm.    Dispense:  14 tablet    Refill:  0    Order Specific Question:   Supervising Provider    Answer:   Dianne DunARON, TALIA M [[id number]]  . fluticasone (FLONASE) 50 MCG/ACT nasal spray    Sig: Place 2 sprays into both nostrils daily.    Dispense:  16 g    Refill:  0    Order Specific Question:   Supervising Provider    Answer:   Dianne DunARON, TALIA M [[id number]]  . oxymetazoline (AFRIN NASAL SPRAY) 0.05 % nasal spray    Sig: Place 1 spray into both nostrils 2 (two) times daily. Use only for 3days, then stop    Dispense:  30 mL    Refill:  0    Order Specific Question:   Supervising Provider    Answer:   Dianne DunARON, TALIA M [[id number]]  . amoxicillin (AMOXIL) 875 MG tablet    Sig: Take 1 tablet (875 mg total) by mouth 2 (two) times daily.    Dispense:  20 tablet    Refill:  0    Order Specific Question:   Supervising Provider    Answer:   Dianne DunARON, TALIA M [[id number]]    Follow-up: Return if symptoms worsen or fail to improve.  [facility] [place] Javonnie Illescas, NP

## 2017-05-08 NOTE — Patient Instructions (Signed)
URI Instructions: Flonase and Afrin use: apply 1spray of afrin in each nare, wait , then apply 2sprays of flonase in each nare. Use both nasal spray consecutively x 3days, then flonase only for at least 14days.  Encourage adequate oral hydration.  Use over-the-counter  "cold" medicines  such as "Tylenol cold" , "Advil cold",  "Mucinex" or" Mucinex D"  for cough and congestion.  Avoid decongestants if you have high blood pressure. Use" Delsym" or" Robitussin" cough syrup varietis for cough.  You can use plain "Tylenol" or "Advi"l for fever, chills and achyness.   "Common cold" symptoms are usually triggered by a virus.  The antibiotics are usually not necessary. On average, a" viral cold" illness would take 4-7 days to resolve. Please, make an appointment if you are not better or if you're worse.   Start amoxicillin if no improvement in 3days.

## 2017-05-08 NOTE — Telephone Encounter (Signed)
C/o moderate - severe sore throat and cough that started about 24 hrs. ago.  (Stated her husband was diagnosed with Strep Throat last week.)  Also, c/o ear pain and headache. Reported she coughed up yellow mucus with blood streaks, this AM.  Denied difficulty swallowing, other than the discomfort.  Denies fever/ chills.  Per protocol scheduled appt. to see PCP today.  Agrees with plan.  Care advice per protocol.  Verb. Understanding.       Reason for Disposition . SEVERE (e.g., excruciating) throat pain  Answer Assessment - Initial Assessment Questions 1. ONSET: "When did the throat start hurting?" (Hours or days ago)      Last night 2. SEVERITY: "How bad is the sore throat?" (Scale 1-10; mild, moderate or severe)   - MILD (1-3):  doesn't interfere with eating or normal activities   - MODERATE (4-7): interferes with eating some solids and normal activities   - SEVERE (8-10):  excruciating pain, interferes with most normal activities   - SEVERE DYSPHAGIA: can't swallow liquids, drooling     Moderate - severe; drinking juice but uncomfortable 3. STREP EXPOSURE: "Has there been any exposure to strep within the past week?" If so, ask: "What type of contact occurred?"     Last week; husband diag. With strep 4.  VIRAL SYMPTOMS: "Are there any symptoms of a cold, such as a runny nose, cough, hoarse voice or red eyes?"      Ear pain, cough; coughed up yellow phlegm with streaks of blood, runny nose  5. FEVER: "Do you have a fever?" If so, ask: "What is your temperature, how was it measured, and when did it start?"     No  6. PUS ON THE TONSILS: "Is there pus on the tonsils in the back of your throat?"     No pus. See red streaks in throat  OTHER SYMPTOMS: "Do you have any other symptoms?" (e.g., difficulty breathing, headache, rash)     Headache, itching of chin last night 8. PREGNANCY: "Is there any chance you are pregnant?" "When was your last menstrual period?"     Post menopausal  Protocols  used: SORE THROAT-A-AH

## 2017-05-29 ENCOUNTER — Encounter (INDEPENDENT_AMBULATORY_CARE_PROVIDER_SITE_OTHER): Payer: Self-pay | Admitting: Orthopaedic Surgery

## 2017-05-29 ENCOUNTER — Ambulatory Visit (INDEPENDENT_AMBULATORY_CARE_PROVIDER_SITE_OTHER): Payer: Managed Care, Other (non HMO) | Admitting: Orthopaedic Surgery

## 2017-05-29 DIAGNOSIS — Z9889 Other specified postprocedural states: Secondary | ICD-10-CM

## 2017-05-29 NOTE — Progress Notes (Signed)
   Post-Op Visit Note   Patient: Christina CornersSheri Macknight           Date of Birth: 1961-01-17           MRN: [id number] Visit Date: 05/29/2017 PCP: Anne NgNche, Charlotte Lum, NP   Assessment & Plan:  Chief Complaint:  Chief Complaint  Patient presents with  . Right Knee - Routine Post Op   Visit Diagnoses:  1. S/P right knee arthroscopy     Plan: Cordelia PenSherry comes in for follow-up.  6 weeks out right knee arthroscopic debridement.  Doing exceptionally well.  She lost her insurance so never went to outpatient physical therapy.  She googled exercises and has been doing them on her own.  Minimal pain but has been increasing her activity.  No fevers chills or any other systemic symptoms.  Well-healing surgical incisions with some infection full range of motion and strength.  She will follow-up with us on an as-needed basis  Follow-Up Instructions: Return if symptoms worsen or fail to improve.   Orders:  No orders of the defined types were placed in this encounter.  No orders of the defined types were placed in this encounter.   Imaging: No results found.  PMFS History: Patient Active Problem List   Diagnosis Date Noted  . Synovitis of right knee 04/21/2017  . 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 Chondromalacia, right knee 04/14/2017  . Meniscal injury, right, sequela 03/28/2017  . Hypertriglyceridemia 03/03/2017  . Right knee pain 12/16/2016  . Fracture of radial neck, right, closed 11/23/2016  . Hyperglycemia 07/26/2016  . Recurrent major depressive disorder (HCC) 07/26/2016  . Hx of rheumatoid arthritis 07/25/2016  . Allergic rhinitis 07/25/2016   Past Medical History:  Diagnosis Date  . Allergy   . Arthritis   . Asthma   . Depression   . Hypertriglyceridemia 03/03/2017  . IBS (irritable bowel syndrome)   . Kidney stone   . Migraines     Family History  Problem Relation Age of Onset  . Hyperlipidemia Mother   . Hyperlipidemia Father   . Heart disease Father     . Hypertension Father   . Kidney disease Father   . Arthritis Maternal Grandmother   . Hyperlipidemia Maternal Grandmother   . Cancer Maternal Grandfather        prostate cancer  . Hyperlipidemia Maternal Grandfather   . Arthritis Paternal Grandmother   . Hyperlipidemia Paternal Grandmother   . Cancer Paternal Grandfather        colon cancer  . Hyperlipidemia Paternal Grandfather   . Heart disease Paternal Grandfather   . Stroke Paternal Grandfather   . Hypertension Paternal Grandfather     Past Surgical History:  Procedure Laterality Date  . CESAREAN SECTION  1996  . scar neunoma surgery  1998   Social History   Occupational History  . Not on file  Tobacco Use  . Smoking status: Never Smoker  . Smokeless tobacco: Never Used  Substance and Sexual Activity  . Alcohol use: Yes    Comment: social  . Drug use: No  . Sexual activity: Not on file

## 2017-06-07 ENCOUNTER — Ambulatory Visit (INDEPENDENT_AMBULATORY_CARE_PROVIDER_SITE_OTHER): Payer: No Typology Code available for payment source | Admitting: Nurse Practitioner

## 2017-06-07 ENCOUNTER — Encounter: Payer: Self-pay | Admitting: Nurse Practitioner

## 2017-06-07 VITALS — BP 126/90 | HR 99 | Temp 98.1°F | Ht 69.0 in | Wt 230.0 lb

## 2017-06-07 DIAGNOSIS — H66006 Acute suppurative otitis media without spontaneous rupture of ear drum, recurrent, bilateral: Secondary | ICD-10-CM

## 2017-06-07 DIAGNOSIS — H1031 Unspecified acute conjunctivitis, right eye: Secondary | ICD-10-CM

## 2017-06-07 MED ORDER — PREDNISONE 20 MG PO TABS: 40.0000 mg | ORAL_TABLET | Freq: Every day | ORAL | 0 refills | Status: DC

## 2017-06-07 MED ORDER — NEOMYCIN-POLYMYXIN-GRAMICIDIN 1.75-10000-.025 OP SOLN: 1.0000 [drp] | Freq: Four times a day (QID) | OPHTHALMIC | 0 refills | Status: DC

## 2017-06-07 MED ORDER — AZITHROMYCIN 250 MG PO TABS: 250.0000 mg | ORAL_TABLET | Freq: Every day | ORAL | 0 refills | Status: DC

## 2017-06-07 NOTE — Progress Notes (Signed)
Subjective:  Patient ID: Christina Huerta, female    DOB: [birth year]  Age: 57 y.o. MRN: [id number]  CC: Cough (sore throat,coughing,congestion,ear stop up---going on for a while. taking otc mucinex,dayquil and tamaflu)   Conjunctivitis   The current episode started more than 1 week ago. The onset was gradual. The problem occurs continuously. The problem has been gradually worsening. The problem is moderate. Nothing aggravates the symptoms. Associated symptoms include eye itching, photophobia, congestion, rhinorrhea, sore throat, cough, URI, eye discharge and eye redness. Pertinent negatives include no fever, no decreased vision, no double vision, no nausea, no ear pain, no headaches, no hearing loss, no mouth sores, no stridor, no swollen glands, no muscle aches, no neck pain, no neck stiffness, no rash and no eye pain. The ear pain is moderate. There is pain in both ears. She has been behaving normally. She has been eating and drinking normally. There were sick contacts at home. Recently, medical care has been given by the PCP. Services received include medications given.    Outpatient Medications Prior to Visit  Medication Sig Dispense Refill  . albuterol (PROVENTIL HFA;VENTOLIN HFA) 108 (90 Base) MCG/ACT inhaler Inhale 1-2 puffs into the lungs every 6 (six) hours as needed. 1 Inhaler 0  . Biotin 78295 MCG TABS Take 10,000 mg by mouth 2 (two) times daily.    . Black Cohosh 80 MG CAPS Take 80 mg by mouth 1 day or 1 dose.    . cetirizine (ZYRTEC) 10 MG tablet Take 1 tablet (10 mg total) by mouth at bedtime. 14 tablet 0  . citalopram (CELEXA) 20 MG tablet Take 1 tablet (20 mg total) by mouth daily. 90 tablet 1  . diazepam (VALIUM) 5 MG tablet take 1 tablet by mouth three times a day as directed  0  . Digestive Enzymes (DIGESTIVE SUPPORT PO) Take by mouth. IB Gard OTC    . fenofibrate (TRICOR) 145 MG tablet Take 1 tablet (145 mg total) by mouth daily. 90 tablet 1  . fluticasone (FLONASE) 50 MCG/ACT  nasal spray Place 2 sprays into both nostrils daily. 16 g 0  . guaiFENesin (MUCINEX) 600 MG 12 hr tablet Take 1 tablet (600 mg total) by mouth 2 (two) times daily as needed for cough or to loosen phlegm. 14 tablet 0  . Multiple Vitamins-Minerals (AIRBORNE PO) Take by mouth.    . Multiple Vitamins-Minerals (MULTIVITAMIN ADULT PO) Take by mouth.    . naproxen (NAPROSYN) 500 MG tablet Take 1 tablet (500 mg total) by mouth 2 (two) times daily as needed (for pain, take with food). 10 tablet 0  . omega-3 acid ethyl esters (LOVAZA) 1 g capsule Take 2 capsules (2 g total) by mouth daily. 180 capsule 1  . oseltamivir (TAMIFLU) 75 MG capsule Take 1 capsule (75 mg total) by mouth 2 (two) times daily. 10 capsule 0  . oxyCODONE-acetaminophen (PERCOCET) 5-325 MG tablet Take 1 tablet by mouth every 8 (eight) hours as needed for severe pain. 6 tablet 0  . oxymetazoline (AFRIN NASAL SPRAY) 0.05 % nasal spray Place 1 spray into both nostrils 2 (two) times daily. Use only for 3days, then stop 30 mL 0  . Probiotic Product (ALIGN PO) Take by mouth 1 day or 1 dose.    . Psyllium (METAMUCIL FIBER PO) Take by mouth 1 day or 1 dose.    . traMADol (ULTRAM) 50 MG tablet Take 1 tablet (50 mg total) by mouth every 6 (six) hours as needed for moderate pain. 30  tablet 0  . amoxicillin (AMOXIL) 875 MG tablet Take 1 tablet (875 mg total) by mouth 2 (two) times daily. (Patient not taking: Reported on 06/07/2017) 20 tablet 0  . benzonatate (TESSALON) 100 MG capsule Take 1 capsule (100 mg total) by mouth 3 (three) times daily as needed for cough. (Patient not taking: Reported on 06/07/2017) 20 capsule 0   No facility-administered medications prior to visit.     ROS See HPI  Objective:  BP 126/90   Pulse 99   Temp 98.1 F (36.7 C)   Ht 5\' 9"  (1.753 m)   Wt 230 lb (104.3 kg)   SpO2 95%   BMI 33.97 kg/m   BP Readings from Last 3 Encounters:  06/07/17 126/90  05/08/17 134/82  03/28/17 132/86    Wt Readings from Last 3  Encounters:  06/07/17 230 lb (104.3 kg)  05/08/17 223 lb (101.2 kg)  03/28/17 228 lb 9.6 oz (103.7 kg)    Physical Exam  Constitutional: She is oriented to person, place, and time.  HENT:  Right Ear: External ear and ear canal normal. No drainage. No mastoid tenderness. Tympanic membrane is injected. Tympanic membrane is not erythematous. A middle ear effusion is present.  Left Ear: Ear canal normal. No drainage. There is mastoid tenderness. Tympanic membrane is injected. Tympanic membrane is not erythematous. A middle ear effusion is present.  Nose: No mucosal edema or rhinorrhea. Right sinus exhibits no maxillary sinus tenderness and no frontal sinus tenderness. Left sinus exhibits no maxillary sinus tenderness and no frontal sinus tenderness.  Mouth/Throat: Uvula is midline. No trismus in the jaw. No oropharyngeal exudate or posterior oropharyngeal erythema.  Eyes: EOM are normal. Pupils are equal, round, and reactive to light. Right eye exhibits chemosis and discharge. Right eye exhibits no hordeolum. Left eye exhibits no chemosis, no discharge and no hordeolum. Right conjunctiva is injected. Right conjunctiva has no hemorrhage. Left conjunctiva is not injected. Left conjunctiva has no hemorrhage. No scleral icterus.  Neck: Normal range of motion. Neck supple.  Cardiovascular: Normal rate and normal heart sounds.  Pulmonary/Chest: Effort normal and breath sounds normal.  Musculoskeletal: She exhibits no edema.  Lymphadenopathy:    She has cervical adenopathy.  Neurological: She is alert and oriented to person, place, and time.  Vitals reviewed.   Lab Results  Component Value Date   GLUCOSE 98 07/27/2016   CHOL 275 (H) 03/03/2017   TRIG 258.0 (H) 03/03/2017   HDL 49.80 03/03/2017   LDLDIRECT 169.0 03/03/2017   ALT 26 03/03/2017   AST 18 03/03/2017   NA 139 07/27/2016   K 3.8 07/27/2016   CL 101 07/27/2016   CREATININE 0.65 07/27/2016   BUN 9 07/27/2016   CO2 29 07/27/2016    TSH 2.07 07/27/2016   HGBA1C 5.7 07/27/2016    Mr Knee Right Wo Contrast  Result Date: 03/15/2017 CLINICAL DATA:  Chronic right knee pain since a fall in July 2018. EXAM: MRI OF THE RIGHT KNEE WITHOUT CONTRAST TECHNIQUE: Multiplanar, multisequence MR imaging of the knee was performed. No intravenous contrast was administered. COMPARISON:  Radiographs dated 12/14/2016 FINDINGS: MENISCI Medial meniscus: There is a complete radial tear of the root of the posterior horn and 10 trick degeneration of the posterior horn the midbody within the far peripheral undersurface longitudinal tear best seen on series 8. Lateral meniscus: Minimal blunting of the free edge of the midbody. Otherwise normal. LIGAMENTS Cruciates:  Intact ACL and PCL. Collaterals: Medial collateral ligament is intact. Lateral collateral  ligament complex is intact. CARTILAGE Patellofemoral: Small focal areas of full-thickness cartilage loss of the medial and lateral facets of the patella. Medial: Small areas of partial-thickness cartilage loss on the medial femoral condyle. Lateral: Small focal area of partial-thickness cartilage loss of the posterior aspect of the lateral tibial plateau. Joint:  Moderate joint effusion. Popliteal Fossa: 15 x 15 x 55 mm Baker's cyst. Intact popliteus tendon. Multiple small ganglion cysts posterior to the posterior cruciate ligament. Extensor Mechanism:  Normal. Bones: Small focal area of subcortical edema at the posterior peripheral aspect of the medial femoral condyle probably secondary to overlying cartilage loss. Other: None IMPRESSION: 1. Complete radial tear of the root of the posterior horn of the medial meniscus with secondary peripheral subluxation of the meniscus. 2. Small peripheral undersurface tear of the posterior horn and midbody of the medial meniscus. 3. Tricompartmental cartilage defects as described. 4. Moderate effusion with small to moderate Baker's cyst. Electronically Signed   By: Francene BoyersJames   Maxwell M.D.   On: 03/15/2017 09:34    Assessment & Plan:   Lavonna RuaSheri was seen today for cough.  Diagnoses and all orders for this visit:  Acute bacterial conjunctivitis of right eye -     neomycin-polymyxin-gramicidin (NEOSPORIN) 1.75-10000-.025 ophthalmic solution; Place 1 drop into the right eye 4 (four) times daily.  Recurrent acute suppurative otitis media without spontaneous rupture of tympanic membrane of both sides -     neomycin-polymyxin-gramicidin (NEOSPORIN) 1.75-10000-.025 ophthalmic solution; Place 1 drop into the right eye 4 (four) times daily. -     predniSONE (DELTASONE) 20 MG tablet; Take 2 tablets (40 mg total) by mouth daily with breakfast. -     azithromycin (ZITHROMAX Z-PAK) 250 MG tablet; Take 1 tablet (250 mg total) by mouth daily. Take 2tabs on first day, then 1tab once a day till complete   I am having Deedee Politte start on neomycin-polymyxin-gramicidin, predniSONE, and azithromycin. I am also having her maintain her Probiotic Product (ALIGN PO), Psyllium (METAMUCIL FIBER PO), Biotin, Black Cohosh, Multiple Vitamins-Minerals (MULTIVITAMIN ADULT PO), Digestive Enzymes (DIGESTIVE SUPPORT PO), cetirizine, diazepam, naproxen, oxyCODONE-acetaminophen, citalopram, fenofibrate, omega-3 acid ethyl esters, traMADol, oseltamivir, benzonatate, albuterol, guaiFENesin, fluticasone, oxymetazoline, amoxicillin, and Multiple Vitamins-Minerals (AIRBORNE PO).  Meds ordered this encounter  Medications  . neomycin-polymyxin-gramicidin (NEOSPORIN) 1.75-10000-.025 ophthalmic solution    Sig: Place 1 drop into the right eye 4 (four) times daily.    Dispense:  10 mL    Refill:  0    Order Specific Question:   Supervising Provider    Answer:   Dianne DunARON, TALIA M [[id number]]  . predniSONE (DELTASONE) 20 MG tablet    Sig: Take 2 tablets (40 mg total) by mouth daily with breakfast.    Dispense:  6 tablet    Refill:  0    Order Specific Question:   Supervising Provider    Answer:   Dianne DunARON, TALIA M  [[id number]]  . azithromycin (ZITHROMAX Z-PAK) 250 MG tablet    Sig: Take 1 tablet (250 mg total) by mouth daily. Take 2tabs on first day, then 1tab once a day till complete    Dispense:  6 tablet    Refill:  0    Order Specific Question:   Supervising Provider    Answer:   Dianne DunARON, TALIA M [[id number]]    Follow-up: No Follow-up on file.  Alysia Pennaharlotte Baran Kuhrt, NP

## 2017-06-07 NOTE — Patient Instructions (Signed)

## 2017-06-12 ENCOUNTER — Encounter: Payer: Self-pay | Admitting: Nurse Practitioner

## 2017-06-12 DIAGNOSIS — R6889 Other general symptoms and signs: Secondary | ICD-10-CM

## 2017-06-12 DIAGNOSIS — J0140 Acute pansinusitis, unspecified: Secondary | ICD-10-CM

## 2017-06-13 MED ORDER — CHLORPHEN-PE-ACETAMINOPHEN 4-10-325 MG PO TABS: 1.0000 | ORAL_TABLET | Freq: Two times a day (BID) | ORAL | 0 refills | Status: DC

## 2017-06-13 MED ORDER — BENZONATATE 100 MG PO CAPS: 100.0000 mg | ORAL_CAPSULE | Freq: Three times a day (TID) | ORAL | 0 refills | Status: DC | PRN

## 2017-06-19 ENCOUNTER — Ambulatory Visit (INDEPENDENT_AMBULATORY_CARE_PROVIDER_SITE_OTHER): Payer: No Typology Code available for payment source | Admitting: Nurse Practitioner

## 2017-06-19 ENCOUNTER — Encounter: Payer: Self-pay | Admitting: Nurse Practitioner

## 2017-06-19 VITALS — BP 136/88 | HR 93 | Temp 98.4°F | Ht 69.0 in | Wt 231.0 lb

## 2017-06-19 DIAGNOSIS — H1013 Acute atopic conjunctivitis, bilateral: Secondary | ICD-10-CM | POA: Diagnosis not present

## 2017-06-19 DIAGNOSIS — R002 Palpitations: Secondary | ICD-10-CM

## 2017-06-19 MED ORDER — OLOPATADINE HCL 0.1 % OP SOLN: 1.0000 [drp] | Freq: Two times a day (BID) | OPHTHALMIC | 0 refills | Status: DC

## 2017-06-19 NOTE — Progress Notes (Signed)
Subjective:  Patient ID: Christina Huerta, female    DOB: [birth year]  Age: 57 y.o. MRN: [id number]  CC: Eye Drainage (eyes ithcy,swelling,watery,cant open for long/2 days/ [place] [facility]--took allergy eye drop and benadry/)   Conjunctivitis   The current episode started 2 days ago. The onset was gradual. The problem occurs continuously (recurrent). The problem has been gradually worsening. The problem is severe. Relieved by: cold compress and benadryl. 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 Associated symptoms include eye itching, eye discharge and eye redness. Pertinent negatives include no fever, no decreased vision, no double vision, no photophobia, no nausea, no vomiting, no congestion, no ear discharge, no ear pain, no headaches, no hearing loss, no mouth sores, no rhinorrhea, no sore throat, no stridor, no swollen glands, no neck pain, no neck stiffness, no cough, no URI, no rash and no eye pain. Associated symptoms comments: Blurry vision due to increased tearing. Both eyes are affected.The eyelid exhibits no abnormality. There were no sick contacts. Recently, medical care has been given by the PCP. Services received include medications given.  Palpitations   This is a new problem. The current episode started in the past 7 days. The problem occurs intermittently. The problem has been unchanged. Nothing aggravates the symptoms. Associated symptoms include an irregular heartbeat. Pertinent negatives include no anxiety, chest fullness, chest pain, coughing, diaphoresis, dizziness, fever, malaise/fatigue, nausea, near-syncope, numbness, shortness of breath, syncope, vomiting or weakness. She has tried nothing for the symptoms. There are no known risk factors. Her past medical history is significant for anxiety.  she is concerned about palpitation due to recent use of azithromycin.  Outpatient Medications Prior to Visit  Medication Sig Dispense Refill  . albuterol  (PROVENTIL HFA;VENTOLIN HFA) 108 (90 Base) MCG/ACT inhaler Inhale 1-2 puffs into the lungs every 6 (six) hours as needed. 1 Inhaler 0  . benzonatate (TESSALON) 100 MG capsule Take 1 capsule (100 mg total) by mouth 3 (three) times daily as needed for cough. 20 capsule 0  . Biotin 40981 MCG TABS Take 10,000 mg by mouth 2 (two) times daily.    . Black Cohosh 80 MG CAPS Take 80 mg by mouth 1 day or 1 dose.    . cetirizine (ZYRTEC) 10 MG tablet Take 1 tablet (10 mg total) by mouth at bedtime. 14 tablet 0  . citalopram (CELEXA) 20 MG tablet Take 1 tablet (20 mg total) by mouth daily. 90 tablet 1  . diazepam (VALIUM) 5 MG tablet take 1 tablet by mouth three times a day as directed  0  . Digestive Enzymes (DIGESTIVE SUPPORT PO) Take by mouth. IB Gard OTC    . fenofibrate (TRICOR) 145 MG tablet Take 1 tablet (145 mg total) by mouth daily. 90 tablet 1  . fluticasone (FLONASE) 50 MCG/ACT nasal spray Place 2 sprays into both nostrils daily. 16 g 0  . guaiFENesin (MUCINEX) 600 MG 12 hr tablet Take 1 tablet (600 mg total) by mouth 2 (two) times daily as needed for cough or to loosen phlegm. 14 tablet 0  . Multiple Vitamins-Minerals (AIRBORNE PO) Take by mouth.    . Multiple Vitamins-Minerals (MULTIVITAMIN ADULT PO) Take by mouth.    . naproxen (NAPROSYN) 500 MG tablet Take 1 tablet (500 mg total) by mouth 2 (two) times daily as needed (for pain, take with food). 10 tablet 0  . omega-3 acid ethyl esters (LOVAZA) 1 g capsule Take 2 capsules (2 g total) by mouth daily. 180 capsule  1  . Probiotic Product (ALIGN PO) Take by mouth 1 day or 1 dose.    . Psyllium (METAMUCIL FIBER PO) Take by mouth 1 day or 1 dose.    . traMADol (ULTRAM) 50 MG tablet Take 1 tablet (50 mg total) by mouth every 6 (six) hours as needed for moderate pain. 30 tablet 0  . azithromycin (ZITHROMAX Z-PAK) 250 MG tablet Take 1 tablet (250 mg total) by mouth daily. Take 2tabs on first day, then 1tab once a day till complete 6 tablet 0  .  Chlorphen-PE-Acetaminophen 4-10-325 MG TABS Take 1 tablet by mouth every 12 (twelve) hours. 6 tablet 0  . neomycin-polymyxin-gramicidin (NEOSPORIN) 1.75-10000-.025 ophthalmic solution Place 1 drop into the right eye 4 (four) times daily. 10 mL 0  . oseltamivir (TAMIFLU) 75 MG capsule Take 1 capsule (75 mg total) by mouth 2 (two) times daily. 10 capsule 0  . oxyCODONE-acetaminophen (PERCOCET) 5-325 MG tablet Take 1 tablet by mouth every 8 (eight) hours as needed for severe pain. 6 tablet 0  . oxymetazoline (AFRIN NASAL SPRAY) 0.05 % nasal spray Place 1 spray into both nostrils 2 (two) times daily. Use only for 3days, then stop 30 mL 0  . predniSONE (DELTASONE) 20 MG tablet Take 2 tablets (40 mg total) by mouth daily with breakfast. 6 tablet 0  . amoxicillin (AMOXIL) 875 MG tablet Take 1 tablet (875 mg total) by mouth 2 (two) times daily. (Patient not taking: Reported on 06/07/2017) 20 tablet 0   No facility-administered medications prior to visit.     ROS See HPI  ECG: NSR with normal QT interval, no previous ECG to compare.  Objective:  BP 136/88   Pulse 93   Temp 98.4 F (36.9 C)   Ht 5\' 9"  (1.753 m)   Wt 231 lb (104.8 kg)   SpO2 95%   BMI 34.11 kg/m   BP Readings from Last 3 Encounters:  06/19/17 136/88  06/07/17 126/90  05/08/17 134/82    Wt Readings from Last 3 Encounters:  06/19/17 231 lb (104.8 kg)  06/07/17 230 lb (104.3 kg)  05/08/17 223 lb (101.2 kg)    Physical Exam  Constitutional: No distress.  HENT:  Right Ear: Tympanic membrane, external ear and ear canal normal.  Left Ear: Tympanic membrane, external ear and ear canal normal.  Nose: Nose normal.  Mouth/Throat: Oropharynx is clear and moist. No oropharyngeal exudate.  Eyes: EOM are normal. Pupils are equal, round, and reactive to light. Right eye exhibits chemosis and discharge. Right eye exhibits no exudate and no hordeolum. No foreign body present in the right eye. Left eye exhibits chemosis and discharge.  Left eye exhibits no exudate and no hordeolum. No foreign body present in the left eye. Right conjunctiva is injected. Right conjunctiva has no hemorrhage. Left conjunctiva is injected. Left conjunctiva has no hemorrhage. No scleral icterus.  Neck: Normal range of motion. Neck supple.  Cardiovascular: Normal rate, regular rhythm and normal heart sounds.  Pulmonary/Chest: Effort normal and breath sounds normal.  Vitals reviewed.   Lab Results  Component Value Date   GLUCOSE 98 07/27/2016   CHOL 275 (H) 03/03/2017   TRIG 258.0 (H) 03/03/2017   HDL 49.80 03/03/2017   LDLDIRECT 169.0 03/03/2017   ALT 26 03/03/2017   AST 18 03/03/2017   NA 139 07/27/2016   K 3.8 07/27/2016   CL 101 07/27/2016   CREATININE 0.65 07/27/2016   BUN 9 07/27/2016   CO2 29 07/27/2016   TSH 2.07  07/27/2016   HGBA1C 5.7 07/27/2016    Mr Knee Right Wo Contrast  Result Date: 03/15/2017 CLINICAL DATA:  Chronic right knee pain since a fall in July 2018. EXAM: MRI OF THE RIGHT KNEE WITHOUT CONTRAST TECHNIQUE: Multiplanar, multisequence MR imaging of the knee was performed. No intravenous contrast was administered. COMPARISON:  Radiographs dated 12/14/2016 FINDINGS: MENISCI Medial meniscus: There is a complete radial tear of the root of the posterior horn and 10 trick degeneration of the posterior horn the midbody within the far peripheral undersurface longitudinal tear best seen on series 8. Lateral meniscus: Minimal blunting of the free edge of the midbody. Otherwise normal. LIGAMENTS Cruciates:  Intact ACL and PCL. Collaterals: Medial collateral ligament is intact. Lateral collateral ligament complex is intact. CARTILAGE Patellofemoral: Small focal areas of full-thickness cartilage loss of the medial and lateral facets of the patella. Medial: Small areas of partial-thickness cartilage loss on the medial femoral condyle. Lateral: Small focal area of partial-thickness cartilage loss of the posterior aspect of the lateral  tibial plateau. Joint:  Moderate joint effusion. Popliteal Fossa: 15 x 15 x 55 mm Baker's cyst. Intact popliteus tendon. Multiple small ganglion cysts posterior to the posterior cruciate ligament. Extensor Mechanism:  Normal. Bones: Small focal area of subcortical edema at the posterior peripheral aspect of the medial femoral condyle probably secondary to overlying cartilage loss. Other: None IMPRESSION: 1. Complete radial tear of the root of the posterior horn of the medial meniscus with secondary peripheral subluxation of the meniscus. 2. Small peripheral undersurface tear of the posterior horn and midbody of the medial meniscus. 3. Tricompartmental cartilage defects as described. 4. Moderate effusion with small to moderate Baker's cyst. Electronically Signed   By: Francene Boyers M.D.   On: 03/15/2017 09:34    Assessment & Plan:   Shara was seen today for eye drainage.  Diagnoses and all orders for this visit:  Acute atopic conjunctivitis of both eyes -     Ambulatory referral to Ophthalmology -     olopatadine (PATANOL) 0.1 % ophthalmic solution; Place 1 drop into both eyes 2 (two) times daily.  Palpitation -     EKG 12-Lead   I have discontinued Haydee Goldberg's oxyCODONE-acetaminophen, oseltamivir, oxymetazoline, amoxicillin, neomycin-polymyxin-gramicidin, predniSONE, azithromycin, and Chlorphen-PE-Acetaminophen. I am also having her start on olopatadine. Additionally, I am having her maintain her Probiotic Product (ALIGN PO), Psyllium (METAMUCIL FIBER PO), Biotin, Black Cohosh, Multiple Vitamins-Minerals (MULTIVITAMIN ADULT PO), Digestive Enzymes (DIGESTIVE SUPPORT PO), cetirizine, diazepam, naproxen, citalopram, fenofibrate, omega-3 acid ethyl esters, traMADol, albuterol, guaiFENesin, fluticasone, Multiple Vitamins-Minerals (AIRBORNE PO), and benzonatate.  Meds ordered this encounter  Medications  . olopatadine (PATANOL) 0.1 % ophthalmic solution    Sig: Place 1 drop into both eyes 2 (two)  times daily.    Dispense:  5 mL    Refill:  0    Order Specific Question:   Supervising Provider    Answer:   Dianne Dun [[id number]]    Follow-up: No Follow-up on file.  [name], NP

## 2017-06-19 NOTE — Patient Instructions (Addendum)
Use cold compress to soothe eyes. Avoid scratching if possible. You will be contacted to schedule appt with ophthalmology.  Normal ECG, no previous ECG to compare.  Allergic Conjunctivitis A clear membrane (conjunctiva) covers the white part of your eye and the inner surface of your eyelid. Allergic conjunctivitis happens when this membrane has inflammation. This is caused by allergies. Common causes of allergic reactions (allergens)include:  Outdoor allergens, such as: ? Pollen. ? Grass and weeds. ? Mold spores.  Indoor allergens, such as: ? Dust. ? Smoke. ? Mold. ? Pet dander. ? Animal hair.  This condition can make your eye red or pink. It can also make your eye feel itchy. This condition cannot be spread from one person to another person (is not contagious). Follow these instructions at home:  Try not to be around things that you are allergic to.  Take or apply over-the-counter and prescription medicines only as told by your doctor. These include any eye drops.  Place a cool, clean washcloth on your eye for 10-20 minutes. Do this 3-4 times a day.  Do not touch or rub your eyes.  Do not wear contact lenses until the inflammation is gone. Wear glasses instead.  Do not wear eye makeup until the inflammation is gone.  Keep all follow-up visits as told by your doctor. This is important. Contact a doctor if:  Your symptoms get worse.  Your symptoms do not get better with treatment.  You have mild eye pain.  You are sensitive to light,  You have spots or blisters on your eyes.  You have pus coming from your eye.  You have a fever. Get help right away if:  You have redness, swelling, or other symptoms in only one eye.  Your vision is blurry.  You have vision changes.  You have very bad eye pain. Summary  Allergic conjunctivitis is caused by allergies. It can make your eye red or pink, and it can make your eye feel itchy.  This condition cannot be spread  from one person to another person (is not contagious).  Try not to be around things that you are allergic to.  Take or apply over-the-counter and prescription medicines only as told by your doctor. These include any eye drops.  Contact your doctor if your symptoms get worse or they do not get better with treatment. This information is not intended to replace advice given to you by your health care provider. Make sure you discuss any questions you have with your health care provider. Document Released: 10/06/2009 Document Revised: 12/11/2015 Document Reviewed: 12/11/2015 Elsevier Interactive Patient Education  2017 [employer]   Palpitations A palpitation is the feeling that your heart:  Has an uneven (irregular) heartbeat.  Is beating faster than normal.  Is fluttering.  Is skipping a beat.  This is usually not a serious problem. In some cases, you may need more medical tests. Follow these instructions at home:  Avoid: ? Caffeine in coffee, tea, soft drinks, diet pills, and energy drinks. ? Chocolate. ? Alcohol.  Do not use any tobacco products. These include cigarettes, chewing tobacco, and e-cigarettes. If you need help quitting, ask your doctor.  Try to reduce your stress. These things may help: ? Yoga. ? Meditation. ? Physical activity. Swimming, jogging, and walking are good choices. ? A method that helps you use your mind to control things in your body, like heartbeats (biofeedback).  Get plenty of rest and sleep.  Take over-the-counter and prescription medicines only as  told by your doctor.  Keep all follow-up visits as told by your doctor. This is important. Contact a doctor if:  Your heartbeat is still fast or uneven after 24 hours.  Your palpitations occur more often. Get help right away if:  You have chest pain.  You feel short of breath.  You have a very bad headache.  You feel dizzy.  You pass out (faint). This information is not intended to  replace advice given to you by your health care provider. Make sure you discuss any questions you have with your health care provider. Document Released: 01/26/2008 Document Revised: 09/24/2015 Document Reviewed: 01/01/2015 Elsevier Interactive Patient Education  [employer].

## 2017-06-20 ENCOUNTER — Ambulatory Visit: Payer: Self-pay | Admitting: Nurse Practitioner

## 2017-08-07 IMAGING — CR DG ELBOW COMPLETE 3+V*R*
4 series · 4 of 4 positions shown · non-contrast
Comparison: None.

CLINICAL DATA: Tripped over a curb leaving work this evening. Right
arm pain from the shoulder through the hand.

EXAM:
RIGHT ELBOW - COMPLETE 3+ VIEW

[x elbow lat right]
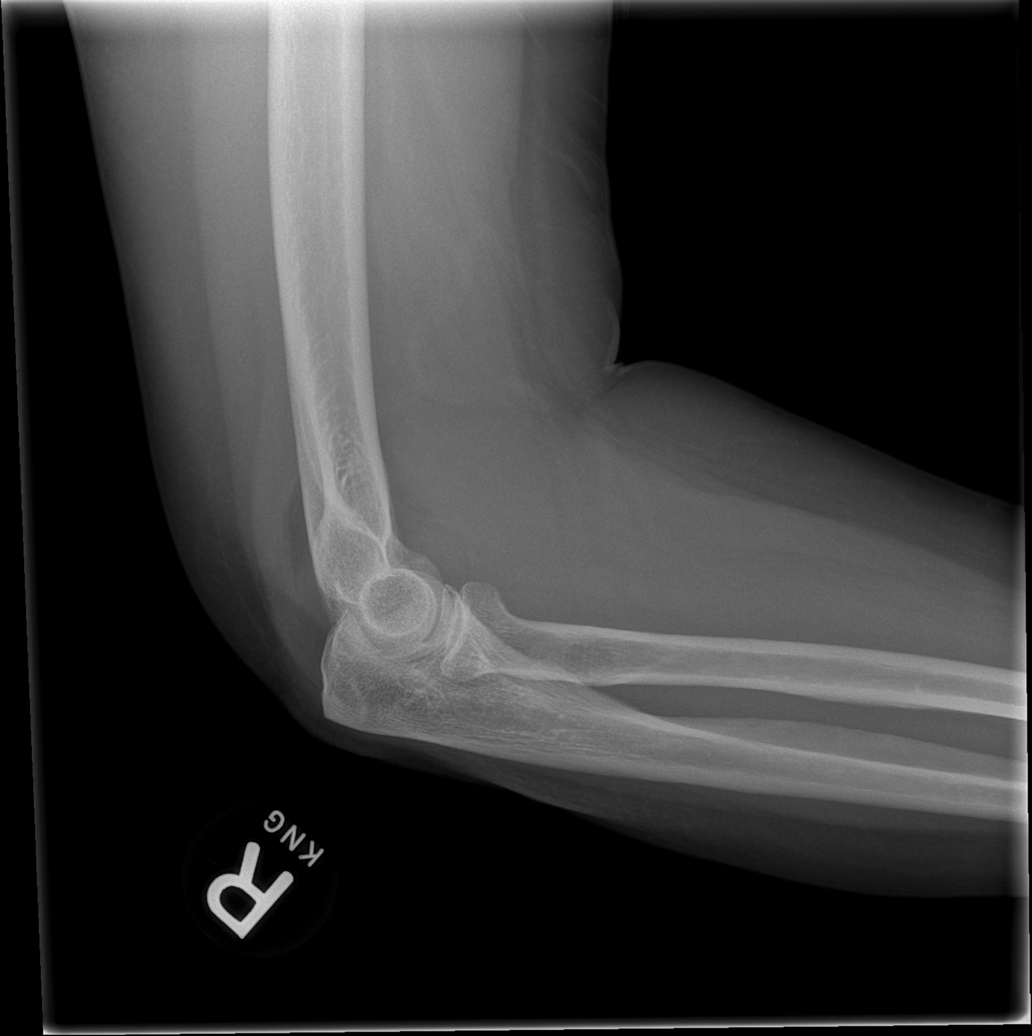

[w elbow ap right (1 of 2)]
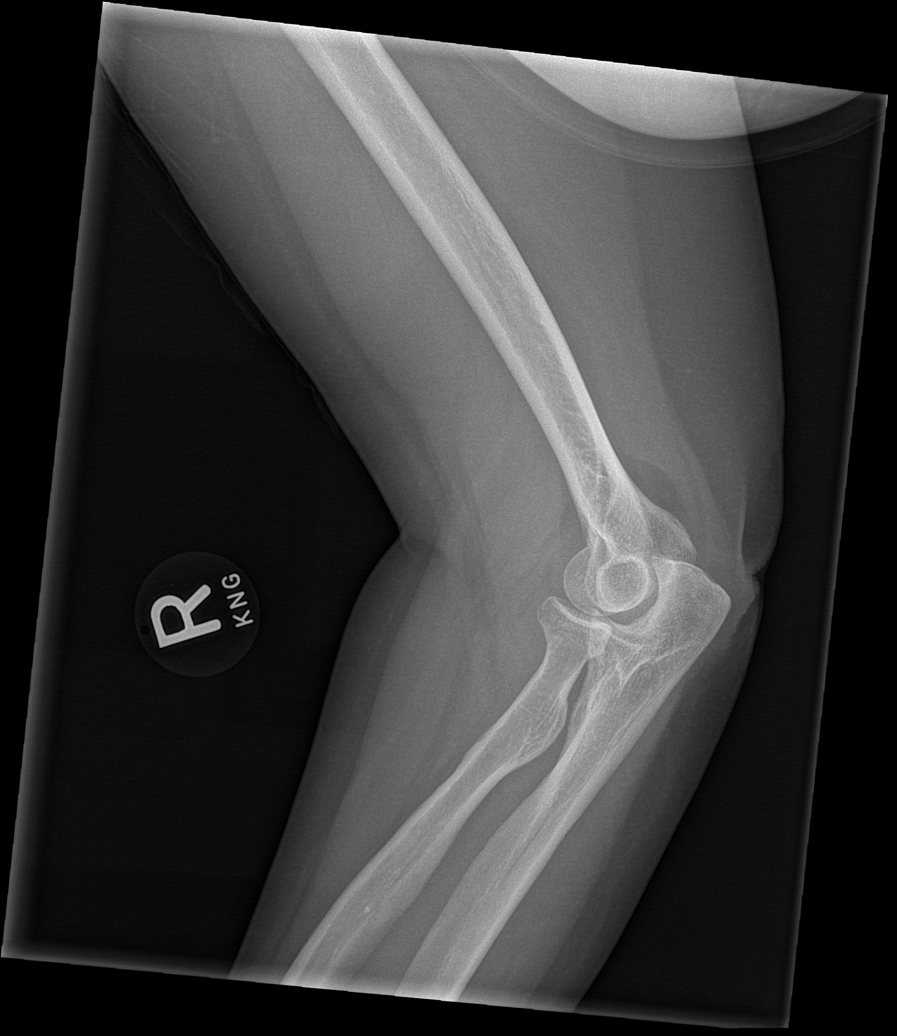

[w elbow ap right (2 of 2)]
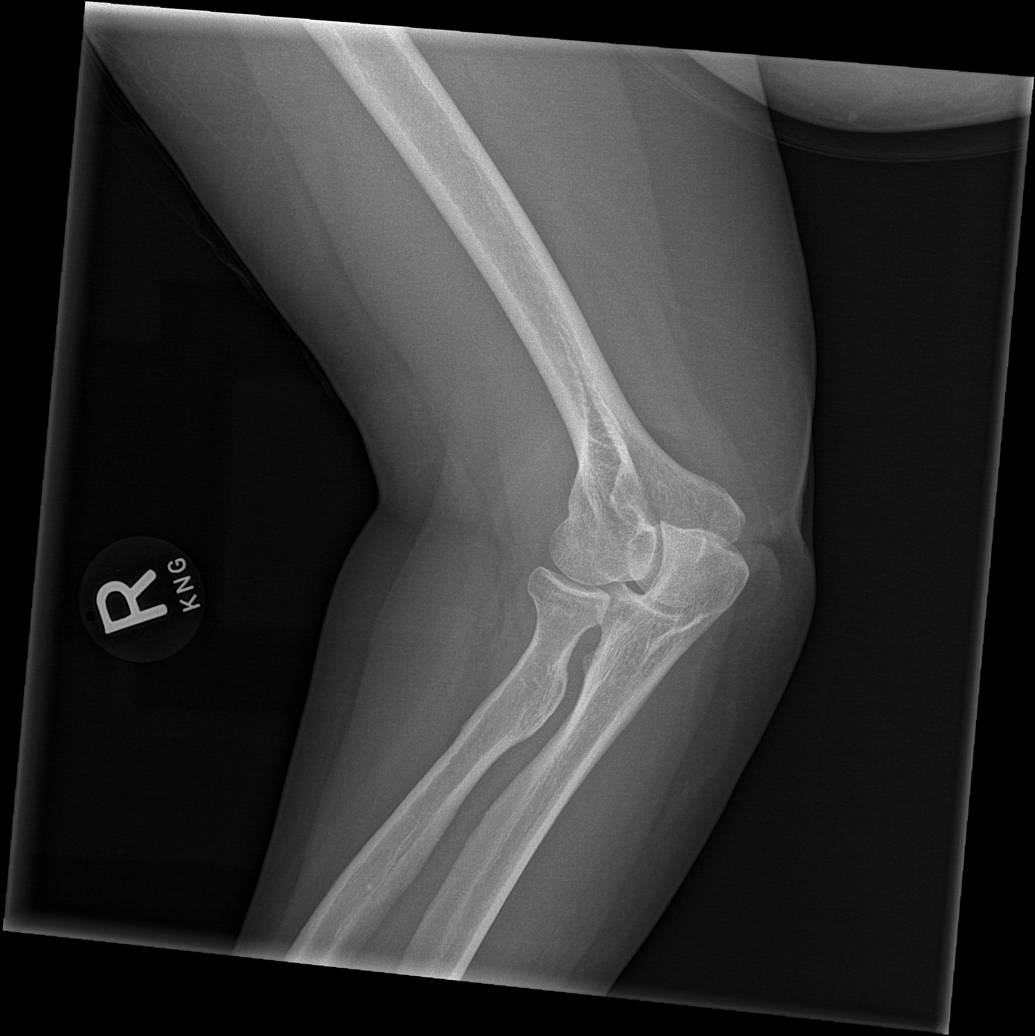

[w elbow obl right]
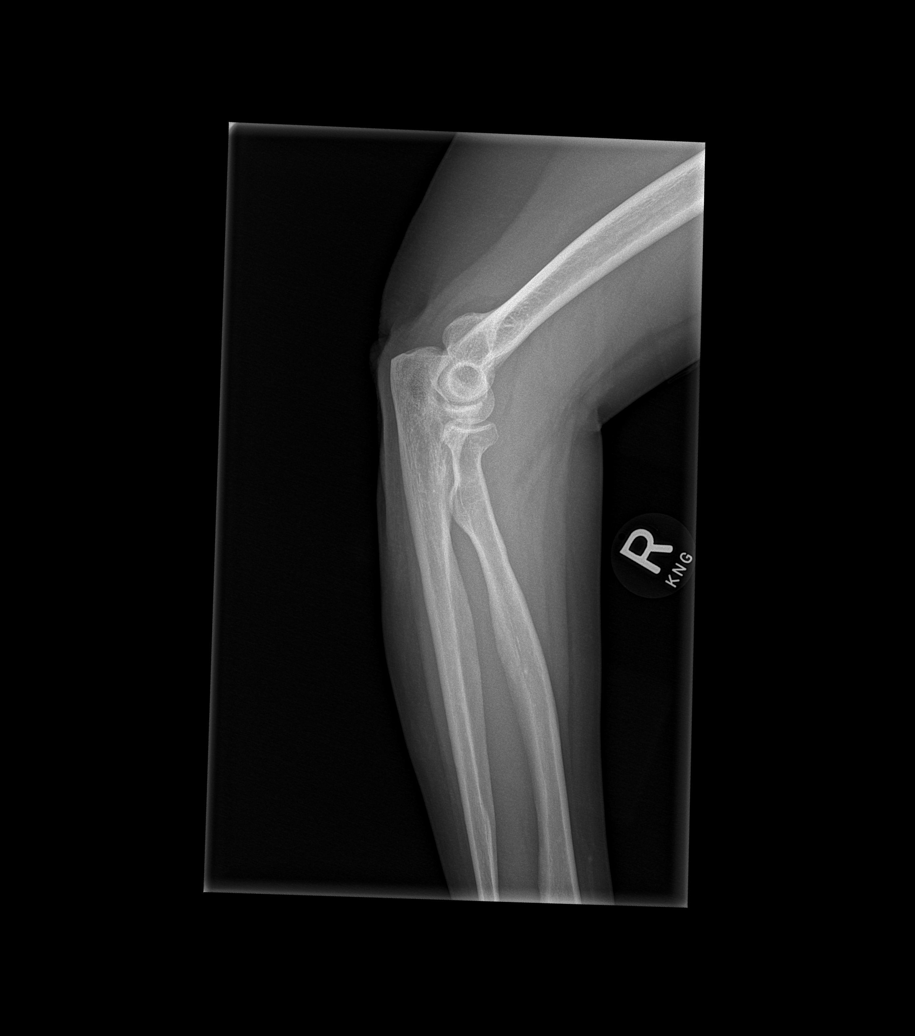

[4 of 4 positions shown; findings below may reference images not displayed]

FINDINGS: Moderate joint effusion. There is a nondisplaced fracture of the
radial neck. No additional fracture of the elbow. Joint spaces are
maintained.
IMPRESSION: Nondisplaced radial neck fracture with moderate elbow joint
effusion.

## 2017-08-07 IMAGING — CR DG SHOULDER 2+V*R*
3 series · 3 of 3 positions shown · non-contrast
Comparison: None.

CLINICAL DATA: Tripped over a curb leaving work this evening. Right
arm pain from the shoulder through the hand.

EXAM:
RIGHT SHOULDER - 2+ VIEW

[w shoulder internal right (1 of 2)]
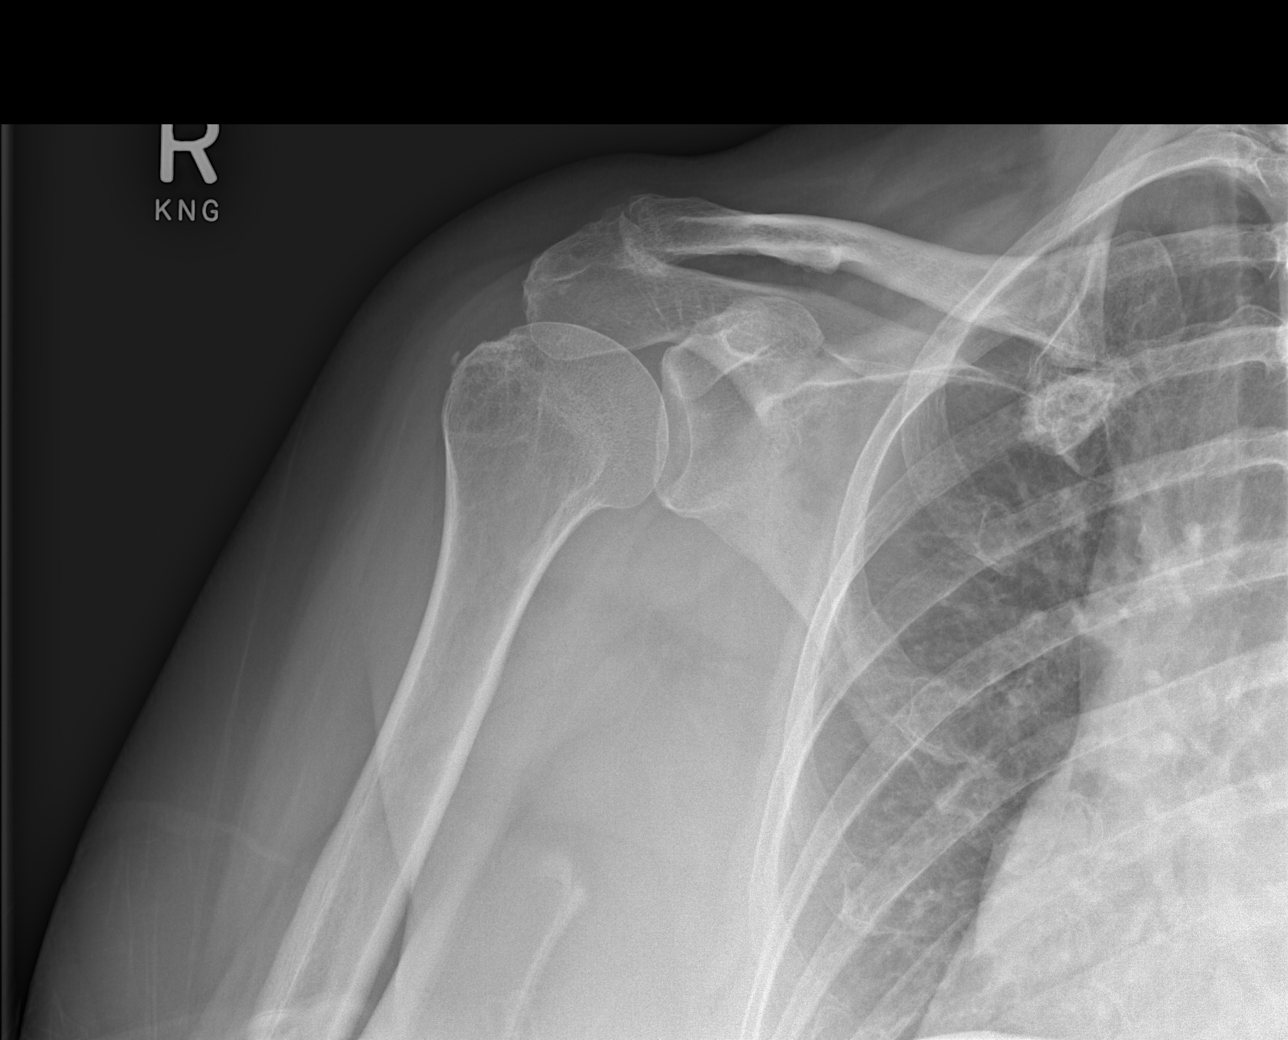

[w shoulder internal right (2 of 2)]
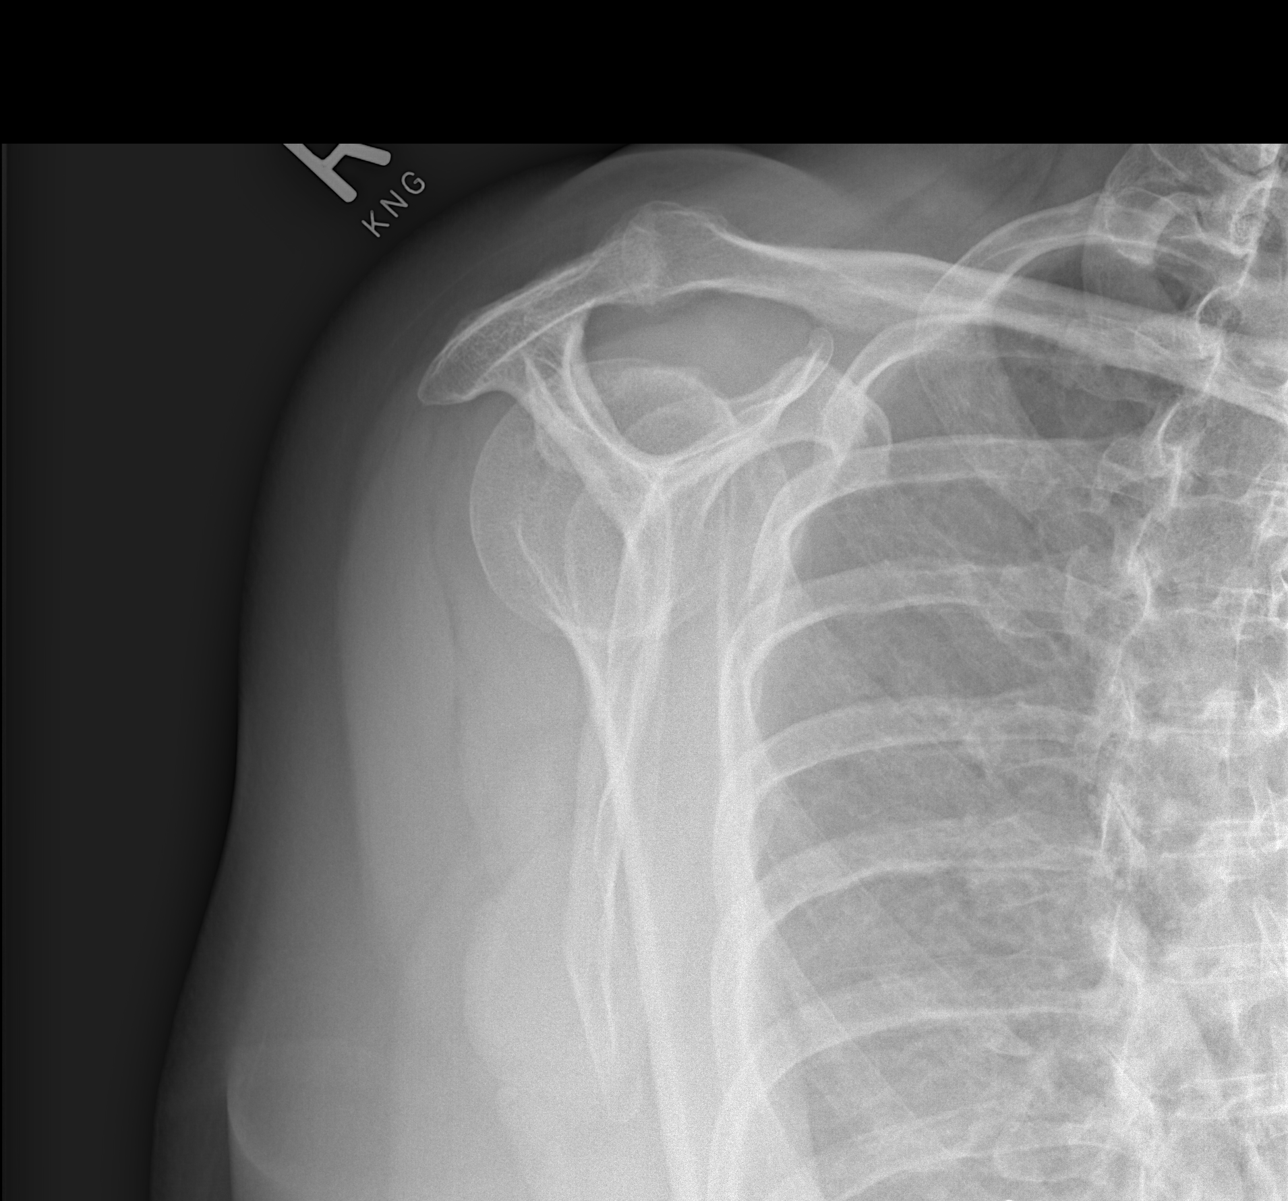

[x shoulder axillary right]
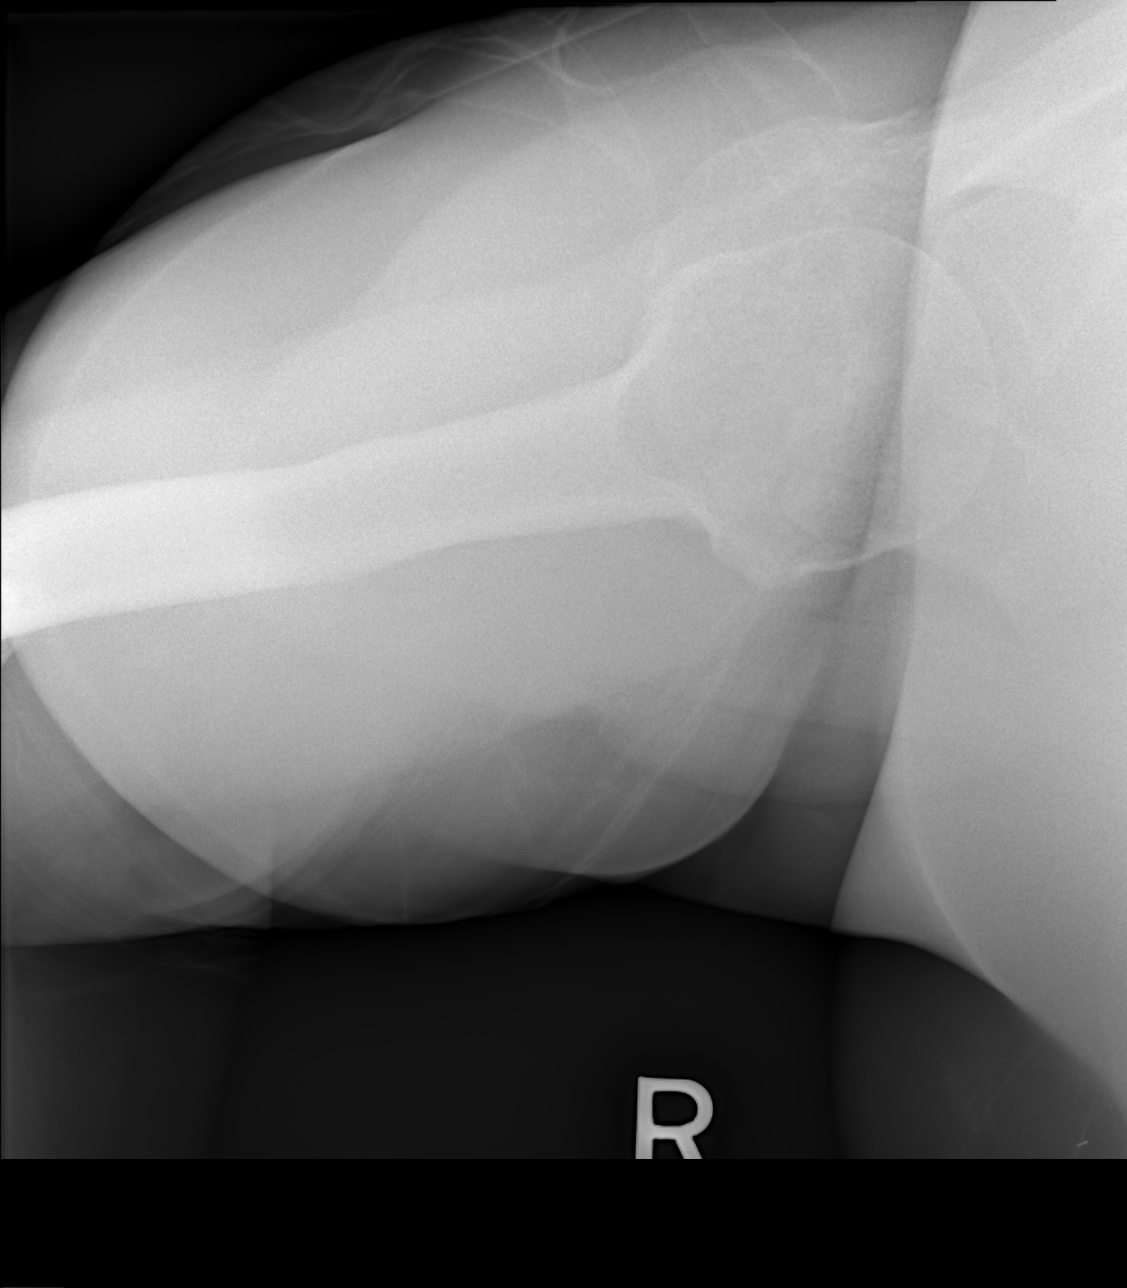

[3 of 3 positions shown; findings below may reference images not displayed]

FINDINGS: There is no evidence of fracture or dislocation. Osteoarthritis of
the acromioclavicular joint with spurring. Subcortical cystic change
of the lateral humeral head suggesting underlying rotator cuff
pathology. Faint soft tissue calcification in the region of the
rotator cuff insertion.
IMPRESSION: 1. No acute fracture or subluxation of the right shoulder.
2. Acromioclavicular osteoarthritis.
3. Findings suggesting underlying rotator cuff pathology.

## 2017-08-19 IMAGING — DX DG ELBOW 2V*R*
2 series · 2 of 2 positions shown · non-contrast
Comparison: Elbow series of November 11, 2016

CLINICAL DATA: Follow-up slightly impacted radial head fracture.

EXAM:
RIGHT ELBOW - 2 VIEW

[elbow ap]
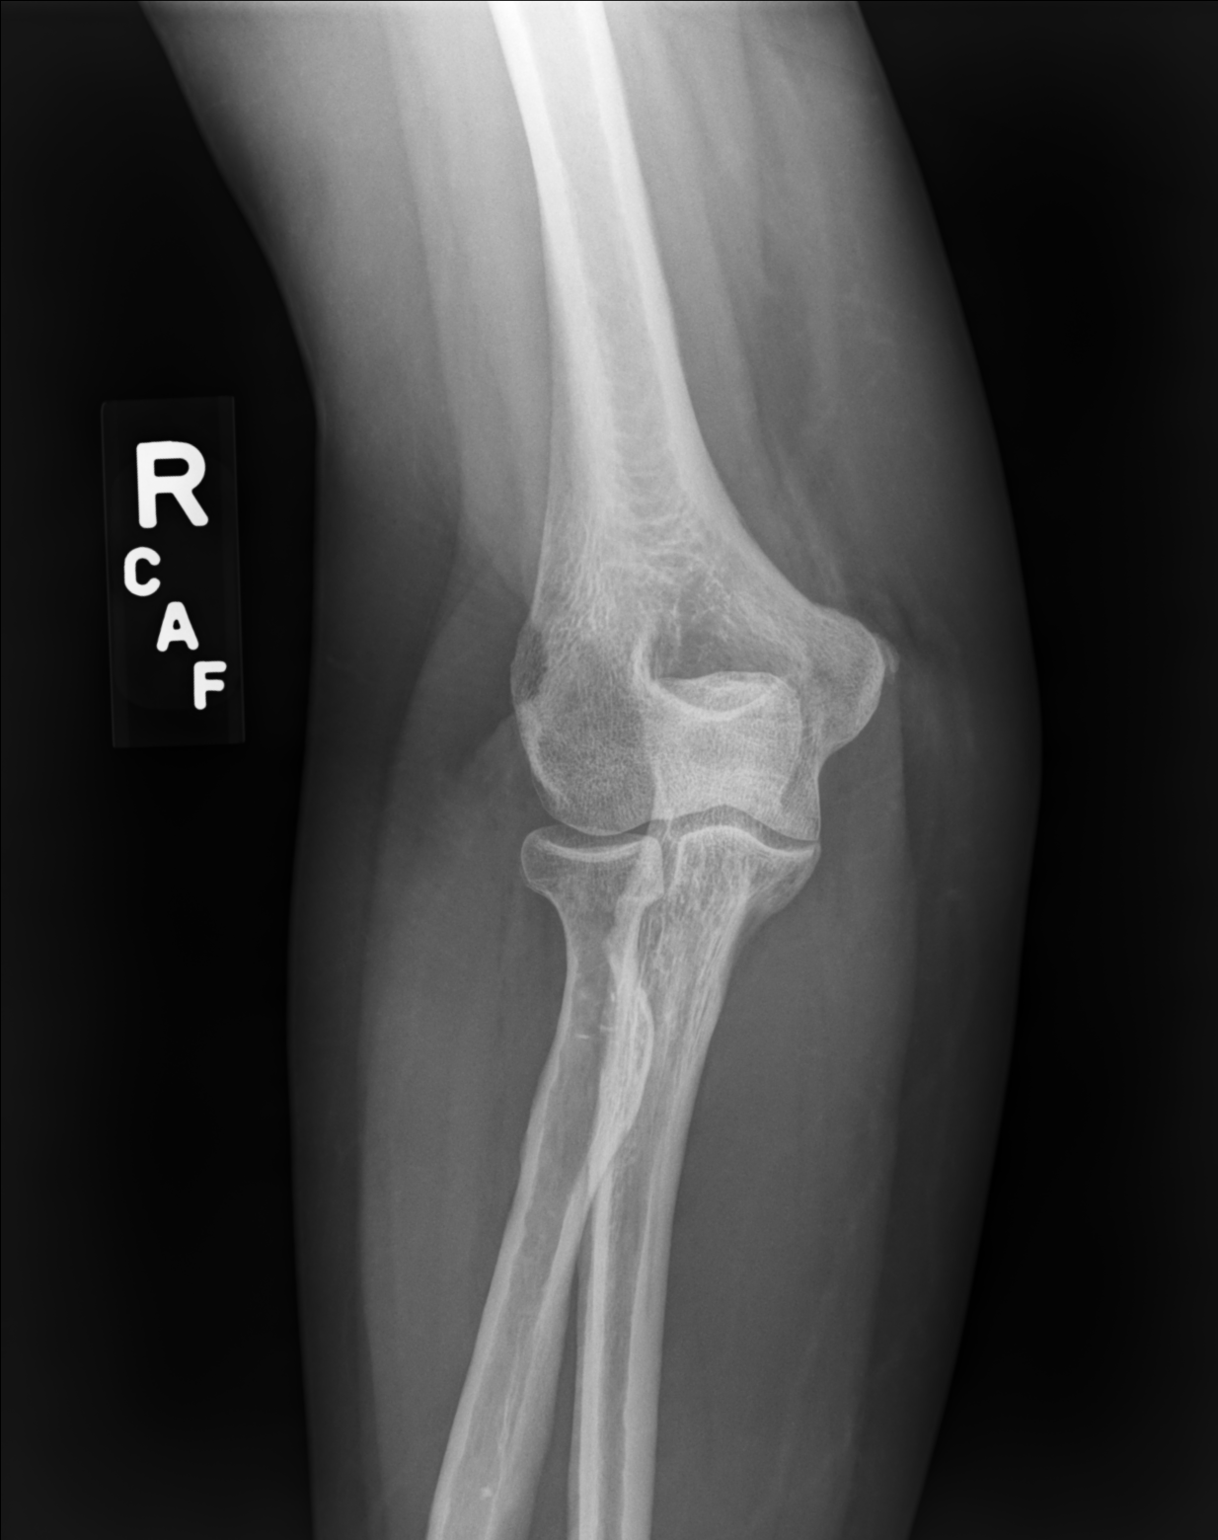

[elbow lat]
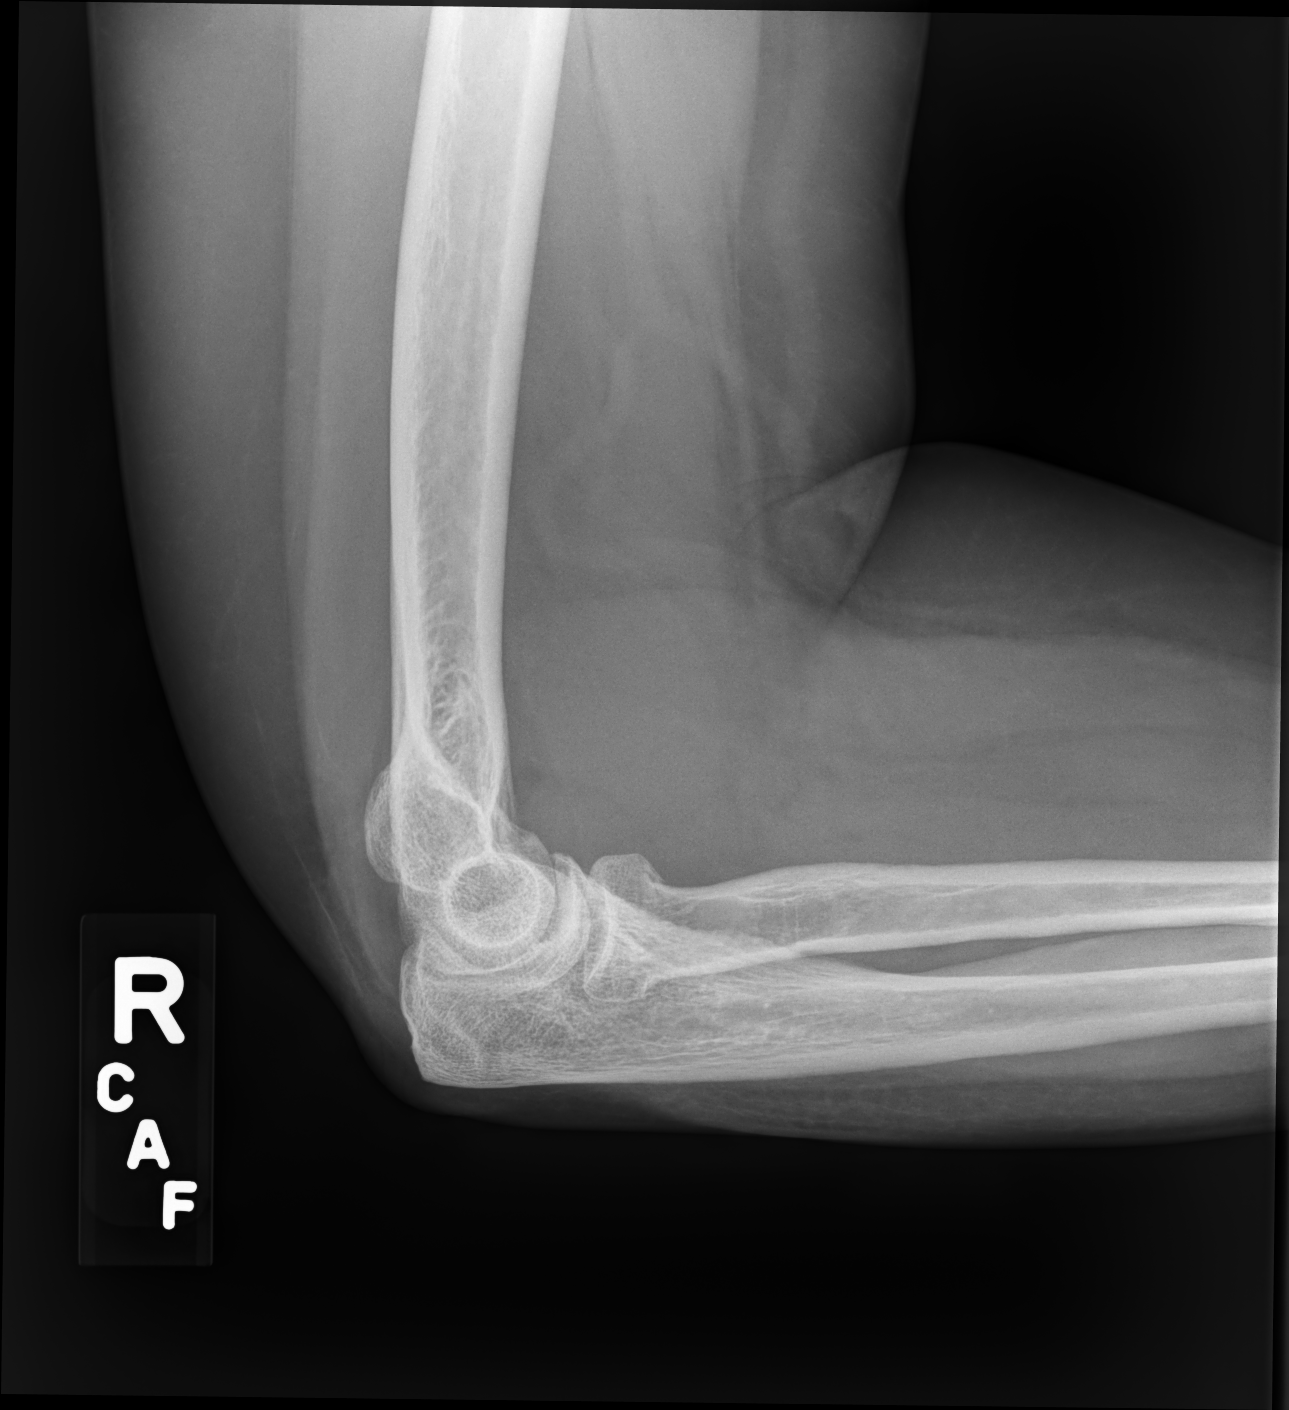

[2 of 2 positions shown; findings below may reference images not displayed]

FINDINGS: There remains subtle cortical deformity of the junction of the neck
with the head of the proximal radius. There is minimal periosteal
reaction. The adjacent ulna and distal humerus are intact. A small
joint effusion remains.
IMPRESSION: Ongoing but incomplete healing of the fracture at the base of the
radial head with its junction with the neck.

## 2017-09-22 ENCOUNTER — Encounter: Payer: Self-pay | Admitting: Family Medicine

## 2017-09-22 ENCOUNTER — Ambulatory Visit (INDEPENDENT_AMBULATORY_CARE_PROVIDER_SITE_OTHER): Payer: No Typology Code available for payment source | Admitting: Family Medicine

## 2017-09-22 DIAGNOSIS — J4521 Mild intermittent asthma with (acute) exacerbation: Secondary | ICD-10-CM | POA: Diagnosis not present

## 2017-09-22 DIAGNOSIS — J45909 Unspecified asthma, uncomplicated: Secondary | ICD-10-CM | POA: Insufficient documentation

## 2017-09-22 MED ORDER — PREDNISONE 20 MG PO TABS: 40.0000 mg | ORAL_TABLET | Freq: Every day | ORAL | 0 refills | Status: AC

## 2017-09-22 MED ORDER — ALBUTEROL SULFATE HFA 108 (90 BASE) MCG/ACT IN AERS: 1.0000 | INHALATION_SPRAY | Freq: Four times a day (QID) | RESPIRATORY_TRACT | 3 refills | Status: DC | PRN

## 2017-09-22 NOTE — Progress Notes (Signed)
Patient ID: Christina Huerta, female   DOB: [birth year], 57 y.o.   MRN: [id number]  Christina Huerta - 57 y.o. female MRN [id number]  Date of birth: 1960/05/25  Subjective Chief Complaint  Patient presents with  . Asthma    HPI Christina Huerta is a 57 y.o. female here today for same day visit with complaint of asthma exacerbation.  Reports that symptoms woke her up from sleep this morning.  Noticed wheezing and tightness in her chest.  She used her inhaler but unfortunately this was expired and didn't give her a lot of relief.   She has also had some increased congestion with post nasal drainage.  She also reports she has been under a lot more stress due to her husbands illness and her grandson was born pre-maturely a few days ago.  She denies fever, chils, sinus pain, increased sputum production, nausea or vomiting.  ROS: ROS completed and negative except as noted per HPI Allergies  Allergen Reactions  . Hydrocodone-Acetaminophen Nausea And Vomiting  . Imitrex [Sumatriptan] Other (See Comments)    Face swelling,break out,red.   . Latex Other (See Comments)    Break out    Past Medical History:  Diagnosis Date  . Allergy   . Arthritis   . Asthma   . Depression   . Hypertriglyceridemia 03/03/2017  . IBS (irritable bowel syndrome)   . Kidney stone   . Migraines     Past Surgical History:  Procedure Laterality Date  . CESAREAN SECTION  1996  . scar neunoma surgery  1998    Social History   Socioeconomic History  . Marital status: Married    Spouse name: Not on file  . Number of children: Not on file  . Years of education: Not on file  . Highest education level: Not on file  Occupational History  . Not on file  Social Needs  . Financial resource strain: Not on file  . Food insecurity:    Worry: Not on file    Inability: Not on file  . Transportation needs:    Medical: Not on file    Non-medical: Not on file  Tobacco Use  . Smoking status: Never Smoker  . Smokeless tobacco:  Never Used  Substance and Sexual Activity  . Alcohol use: Yes    Comment: social  . Drug use: No  . Sexual activity: Not on file  Lifestyle  . Physical activity:    Days per week: Not on file    Minutes per session: Not on file  . Stress: Not on file  Relationships  . Social connections:    Talks on phone: Not on file    Gets together: Not on file    Attends religious service: Not on file    Active member of club or organization: Not on file    Attends meetings of clubs or organizations: Not on file    Relationship status: Not on file  Other Topics Concern  . Not on file  Social History Narrative  . Not on file    Family History  Problem Relation Age of Onset  . Hyperlipidemia Mother   . Hyperlipidemia Father   . Heart disease Father   . Hypertension Father   . Kidney disease Father   . Arthritis Maternal Grandmother   . Hyperlipidemia Maternal Grandmother   . Cancer Maternal Grandfather        prostate cancer  . Hyperlipidemia Maternal Grandfather   . Arthritis Paternal Grandmother   .  Hyperlipidemia Paternal Grandmother   . Cancer Paternal Grandfather        colon cancer  . Hyperlipidemia Paternal Grandfather   . Heart disease Paternal Grandfather   . Stroke Paternal Grandfather   . Hypertension Paternal Grandfather     Health Maintenance  Topic Date Due  . HIV Screening  12/23/1977  . PAP SMEAR  12/24/1983  . MAMMOGRAM  12/23/2012  . COLONOSCOPY  12/23/2012  . INFLUENZA VACCINE  11/30/2017  . TETANUS/TDAP  01/01/2024  . Hepatitis C Screening  Completed    ----------------------------------------------------------------------------------------------------------------------------------------------------------------------------------------------------------------- Physical Exam BP 130/90   Pulse 83   Wt 233 lb 6.4 oz (105.9 kg)   SpO2 96%   BMI 34.47 kg/m   Physical Exam  Constitutional: She is oriented to person, place, and time. She appears  well-nourished. No distress.  HENT:  Head: Normocephalic and atraumatic.  Mouth/Throat: Oropharynx is clear and moist.  Eyes: No scleral icterus.  Neck: Neck supple. No thyromegaly present.  Cardiovascular: Normal rate, regular rhythm and normal heart sounds.  Pulmonary/Chest: No respiratory distress.  Mild end expiratory wheezing.    Lymphadenopathy:    She has no cervical adenopathy.  Neurological: She is alert and oriented to person, place, and time.  Skin: Skin is warm and dry. No rash noted.  Psychiatric:  Tearful at times when discussing current life stressors.     ------------------------------------------------------------------------------------------------------------------------------------------------------------------------------------------------------------------- Assessment and Plan  Mild intermittent asthma with acute exacerbation Mild wheezing on exam, renewal of albuterol provided Rx for prednisone sent in, instructed to start in symptoms are not improving with albuterol alone. Call for any continued worsening.

## 2017-09-22 NOTE — Patient Instructions (Addendum)
Use albuterol every 4-6 hours as needed If symptoms are not improving go ahead and start prednisone Call for any worsening symptoms.    Asthma, Adult Asthma is a condition of the lungs in which the airways tighten and narrow. Asthma can make it hard to breathe. Asthma cannot be cured, but medicine and lifestyle changes can help control it. Asthma may be started (triggered) by:  Animal skin flakes (dander).  Dust.  Cockroaches.  Pollen.  Mold.  Smoke.  Cleaning products.  Hair sprays or aerosol sprays.  Paint fumes or strong smells.  Cold air, weather changes, and winds.  Crying or laughing hard.  Stress.  Certain medicines or drugs.  Foods, such as dried fruit, potato chips, and sparkling grape juice.  Infections or conditions (colds, flu).  Exercise.  Certain medical conditions or diseases.  Exercise or tiring activities.  Follow these instructions at home:  Take medicine as told by your doctor.  Use a peak flow meter as told by your doctor. A peak flow meter is a tool that measures how well the lungs are working.  Record and keep track of the peak flow meter's readings.  Understand and use the asthma action plan. An asthma action plan is a written plan for taking care of your asthma and treating your attacks.  To help prevent asthma attacks: ? Do not smoke. Stay away from secondhand smoke. ? Change your heating and air conditioning filter often. ? Limit your use of fireplaces and wood stoves. ? Get rid of pests (such as roaches and mice) and their droppings. ? Throw away plants if you see mold on them. ? Clean your floors. Dust regularly. Use cleaning products that do not smell. ? Have someone vacuum when you are not home. Use a vacuum cleaner with a HEPA filter if possible. ? Replace carpet with wood, tile, or vinyl flooring. Carpet can trap animal skin flakes and dust. ? Use allergy-proof pillows, mattress covers, and box spring covers. ? Wash bed  sheets and blankets every week in hot water and dry them in a dryer. ? Use blankets that are made of polyester or cotton. ? Clean bathrooms and kitchens with bleach. If possible, have someone repaint the walls in these rooms with mold-resistant paint. Keep out of the rooms that are being cleaned and painted. ? Wash hands often. Contact a doctor if:  You have make a whistling sound when breaking (wheeze), have shortness of breath, or have a cough even if taking medicine to prevent attacks.  The colored mucus you cough up (sputum) is thicker than usual.  The colored mucus you cough up changes from clear or white to yellow, green, gray, or bloody.  You have problems from the medicine you are taking such as: ? A rash. ? Itching. ? Swelling. ? Trouble breathing.  You need reliever medicines more than 2-3 times a week.  Your peak flow measurement is still at 50-79% of your personal best after following the action plan for 1 hour.  You have a fever. Get help right away if:  You seem to be worse and are not responding to medicine during an asthma attack.  You are short of breath even at rest.  You get short of breath when doing very little activity.  You have trouble eating, drinking, or talking.  You have chest pain.  You have a fast heartbeat.  Your lips or fingernails start to turn blue.  You are light-headed, dizzy, or faint.  Your peak flow  is less than 50% of your personal best. This information is not intended to replace advice given to you by your health care provider. Make sure you discuss any questions you have with your health care provider. Document Released: 10/05/2007 Document Revised: 09/24/2015 Document Reviewed: 11/15/2012 Elsevier Interactive Patient Education  2017 [employer].

## 2017-09-22 NOTE — Assessment & Plan Note (Addendum)
Mild wheezing on exam, renewal of albuterol provided Rx for prednisone sent in, instructed to start in symptoms are not improving with albuterol alone. Call for any continued worsening.

## 2017-10-11 ENCOUNTER — Encounter: Payer: Self-pay | Admitting: Family Medicine

## 2017-10-11 ENCOUNTER — Ambulatory Visit: Payer: Self-pay | Admitting: Family Medicine

## 2017-10-11 VITALS — BP 118/94 | HR 94 | Temp 98.4°F | Ht 69.0 in | Wt 228.8 lb

## 2017-10-11 DIAGNOSIS — J329 Chronic sinusitis, unspecified: Secondary | ICD-10-CM | POA: Insufficient documentation

## 2017-10-11 DIAGNOSIS — J0100 Acute maxillary sinusitis, unspecified: Secondary | ICD-10-CM

## 2017-10-11 MED ORDER — PROMETHAZINE-DM 6.25-15 MG/5ML PO SYRP: 5.0000 mL | ORAL_SOLUTION | Freq: Four times a day (QID) | ORAL | 0 refills | Status: DC | PRN

## 2017-10-11 MED ORDER — AMOXICILLIN-POT CLAVULANATE 875-125 MG PO TABS: 1.0000 | ORAL_TABLET | Freq: Two times a day (BID) | ORAL | 0 refills | Status: DC

## 2017-10-11 MED ORDER — FLUCONAZOLE 150 MG PO TABS: 150.0000 mg | ORAL_TABLET | Freq: Once | ORAL | 0 refills | Status: AC

## 2017-10-11 NOTE — Assessment & Plan Note (Signed)
Remain well hydrated Rest May continue OTC medications for symptomatic treatment Rx for augmentin  Promethazine-DM for evening cough if needed Diflucan x1 if needed as she does often get yeast infections after antibiotics

## 2017-10-11 NOTE — Patient Instructions (Signed)

## 2017-10-11 NOTE — Progress Notes (Signed)
Christina Huerta - 57 y.o. female MRN [id number]  Date of birth: March 24, 1961  Subjective Chief Complaint  Patient presents with  . Nasal Congestion    chest congestion started on Sunday. Yesterday started producing yellow sputum, nose hasnt stopped running.    HPI Christina Huerta is a 57 y.o. female here today with complaint of cough, chest and nasal congestion along with L side sinus pain, and sore throat.  Reports that symptoms began a few days ago and have worsened.  Sputum and nasal discharge has been thick and yellow in appearance. She has tried several OTC medications with varying degrees of improvement.  Reports more stress and not sleeping well due to husband having c-spine surgery and newborn grandchild passing away. She denies fever, chills, wheezing, shortness of breath, rash, or chest pain.   ROS:  ROS completed and negative except as noted per HPI   Allergies  Allergen Reactions  . Hydrocodone-Acetaminophen Nausea And Vomiting  . Imitrex [Sumatriptan] Other (See Comments)    Face swelling,break out,red.   . Latex Other (See Comments)    Break out    Past Medical History:  Diagnosis Date  . Allergy   . Arthritis   . Asthma   . Depression   . Hypertriglyceridemia 03/03/2017  . IBS (irritable bowel syndrome)   . Kidney stone   . Migraines     Past Surgical History:  Procedure Laterality Date  . CESAREAN SECTION  1996  . scar neunoma surgery  1998    Social History   Socioeconomic History  . Marital status: Married    Spouse name: Not on file  . Number of children: Not on file  . Years of education: Not on file  . Highest education level: Not on file  Occupational History  . Not on file  Social Needs  . Financial resource strain: Not on file  . Food insecurity:    Worry: Not on file    Inability: Not on file  . Transportation needs:    Medical: Not on file    Non-medical: Not on file  Tobacco Use  . Smoking status: Never Smoker  . Smokeless tobacco: Never  Used  Substance and Sexual Activity  . Alcohol use: Yes    Comment: social  . Drug use: No  . Sexual activity: Not on file  Lifestyle  . Physical activity:    Days per week: Not on file    Minutes per session: Not on file  . Stress: Not on file  Relationships  . Social connections:    Talks on phone: Not on file    Gets together: Not on file    Attends religious service: Not on file    Active member of club or organization: Not on file    Attends meetings of clubs or organizations: Not on file    Relationship status: Not on file  Other Topics Concern  . Not on file  Social History Narrative  . Not on file    Family History  Problem Relation Age of Onset  . Hyperlipidemia Mother   . Hyperlipidemia Father   . Heart disease Father   . Hypertension Father   . Kidney disease Father   . Arthritis Maternal Grandmother   . Hyperlipidemia Maternal Grandmother   . Cancer Maternal Grandfather        prostate cancer  . Hyperlipidemia Maternal Grandfather   . Arthritis Paternal Grandmother   . Hyperlipidemia Paternal Grandmother   . Cancer Paternal Grandfather  colon cancer  . Hyperlipidemia Paternal Grandfather   . Heart disease Paternal Grandfather   . Stroke Paternal Grandfather   . Hypertension Paternal Grandfather     Health Maintenance  Topic Date Due  . HIV Screening  12/23/1977  . PAP SMEAR  12/24/1983  . MAMMOGRAM  12/23/2012  . COLONOSCOPY  12/23/2012  . INFLUENZA VACCINE  11/30/2017  . TETANUS/TDAP  01/01/2024  . Hepatitis C Screening  Completed    ----------------------------------------------------------------------------------------------------------------------------------------------------------------------------------------------------------------- Physical Exam BP (!) 118/94 (BP Location: Left Arm, Patient Position: Sitting, Cuff Size: Normal)   Pulse 94   Temp 98.4 F (36.9 C) (Oral)   Ht 5\' 9"  (1.753 m)   Wt 228 lb 12.8 oz (103.8 kg)    SpO2 96%   BMI 33.79 kg/m   Physical Exam  Constitutional: She appears well-nourished. No distress.  HENT:  Head: Normocephalic and atraumatic.  Nares congested, turbinates inflamed L side maxillary sinus tenderness  TM's normal bilaterally.    Eyes: No scleral icterus.  Neck: Neck supple. No thyromegaly present.  Cardiovascular: Normal rate, regular rhythm, normal heart sounds and intact distal pulses.  Pulmonary/Chest: Effort normal and breath sounds normal.  Lymphadenopathy:    She has no cervical adenopathy.  Skin: Skin is warm and dry. No rash noted.  Psychiatric: She has a normal mood and affect. Her behavior is normal.    ------------------------------------------------------------------------------------------------------------------------------------------------------------------------------------------------------------------- Assessment and Plan  Acute non-recurrent maxillary sinusitis Remain well hydrated Rest May continue OTC medications for symptomatic treatment Rx for augmentin  Promethazine-DM for evening cough if needed Diflucan x1 if needed as she does often get yeast infections after antibiotics

## 2018-02-27 ENCOUNTER — Encounter: Payer: Self-pay | Admitting: Nurse Practitioner

## 2018-02-27 ENCOUNTER — Ambulatory Visit: Payer: Self-pay | Admitting: Nurse Practitioner

## 2018-02-27 VITALS — BP 138/98 | HR 86 | Temp 98.7°F | Ht 69.0 in | Wt 236.0 lb

## 2018-02-27 DIAGNOSIS — J329 Chronic sinusitis, unspecified: Secondary | ICD-10-CM

## 2018-02-27 DIAGNOSIS — F339 Major depressive disorder, recurrent, unspecified: Secondary | ICD-10-CM

## 2018-02-27 DIAGNOSIS — J3089 Other allergic rhinitis: Secondary | ICD-10-CM

## 2018-02-27 DIAGNOSIS — F331 Major depressive disorder, recurrent, moderate: Secondary | ICD-10-CM

## 2018-02-27 LAB — BASIC METABOLIC PANEL
BUN: 9 mg/dL (ref 6–23)
CALCIUM: 9.7 mg/dL (ref 8.4–10.5)
CO2: 29 mEq/L (ref 19–32)
Chloride: 100 mEq/L (ref 96–112)
Creatinine, Ser: 0.62 mg/dL (ref 0.40–1.20)
GFR: 106.15 mL/min (ref >60.00)
Glucose, Bld: 97 mg/dL (ref 70–99)
Potassium: 4 mEq/L (ref 3.5–5.1)
Sodium: 137 mEq/L (ref 135–145)

## 2018-02-27 LAB — HEPATIC FUNCTION PANEL
ALT: 30 U/L (ref 0–35)
AST: 20 U/L (ref 0–37)
Albumin: 4.7 g/dL (ref 3.5–5.2)
Alkaline Phosphatase: 80 U/L (ref 39–117)
BILIRUBIN DIRECT: 0.1 mg/dL (ref 0.0–0.3)
BILIRUBIN TOTAL: 0.6 mg/dL (ref 0.2–1.2)
TOTAL PROTEIN: 7.7 g/dL (ref 6.0–8.3)

## 2018-02-27 MED ORDER — CITALOPRAM HYDROBROMIDE 20 MG PO TABS: 20.0000 mg | ORAL_TABLET | Freq: Every day | ORAL | 1 refills | Status: DC

## 2018-02-27 MED ORDER — MONTELUKAST SODIUM 10 MG PO TABS: 10.0000 mg | ORAL_TABLET | Freq: Every day | ORAL | 3 refills | Status: DC

## 2018-02-27 MED ORDER — METHYLPREDNISOLONE 4 MG PO TBPK: ORAL_TABLET | ORAL | 0 refills | Status: DC

## 2018-02-27 MED ORDER — CETIRIZINE HCL 10 MG PO TABS: 10.0000 mg | ORAL_TABLET | Freq: Every day | ORAL | 0 refills | Status: DC

## 2018-02-27 MED ORDER — FLUTICASONE PROPIONATE 50 MCG/ACT NA SUSP
2.0000 | Freq: Every day | NASAL | 0 refills | Status: DC
Start: 2018-02-27 — End: 2018-08-01

## 2018-02-27 NOTE — Patient Instructions (Addendum)
Go to lab for blood draw.  Declined sinus CT and x-ray due to limited finance and no health insurance coverage  Use saline sinus irrigation once a day.   Sinusitis, Adult Sinusitis is soreness and inflammation of your sinuses. Sinuses are hollow spaces in the bones around your face. Your sinuses are located:  Around your eyes.  In the middle of your forehead.  Behind your nose.  In your cheekbones.  Your sinuses and nasal passages are lined with a stringy fluid (mucus). Mucus normally drains out of your sinuses. When your nasal tissues become inflamed or swollen, the mucus can become trapped or blocked so air cannot flow through your sinuses. This allows bacteria, viruses, and funguses to grow, which leads to infection. Sinusitis can develop quickly and last for 7?10 days (acute) or for more than 12 weeks (chronic). Sinusitis often develops after a cold. What are the causes? This condition is caused by anything that creates swelling in the sinuses or stops mucus from draining, including:  Allergies.  Asthma.  Bacterial or viral infection.  Abnormally shaped bones between the nasal passages.  Nasal growths that contain mucus (nasal polyps).  Narrow sinus openings.  Pollutants, such as chemicals or irritants in the air.  A foreign object stuck in the nose.  A fungal infection. This is rare.  What increases the risk? The following factors may make you more likely to develop this condition:  Having allergies or asthma.  Having had a recent cold or respiratory tract infection.  Having structural deformities or blockages in your nose or sinuses.  Having a weak immune system.  Doing a lot of swimming or diving.  Overusing nasal sprays.  Smoking.  What are the signs or symptoms? The main symptoms of this condition are pain and a feeling of pressure around the affected sinuses. Other symptoms include:  Upper toothache.  Earache.  Headache.  Bad  breath.  Decreased sense of smell and taste.  A cough that may get worse at night.  Fatigue.  Fever.  Thick drainage from your nose. The drainage is often green and it may contain pus (purulent).  Stuffy nose or congestion.  Postnasal drip. This is when extra mucus collects in the throat or back of the nose.  Swelling and warmth over the affected sinuses.  Sore throat.  Sensitivity to light.  How is this diagnosed? This condition is diagnosed based on symptoms, a medical history, and a physical exam. To find out if your condition is acute or chronic, your health care provider may:  Look in your nose for signs of nasal polyps.  Tap over the affected sinus to check for signs of infection.  View the inside of your sinuses using an imaging device that has a light attached (endoscope).  If your health care provider suspects that you have chronic sinusitis, you may also:  Be tested for allergies.  Have a sample of mucus taken from your nose (nasal culture) and checked for bacteria.  Have a mucus sample examined to see if your sinusitis is related to an allergy.  If your sinusitis does not respond to treatment and it lasts longer than 8 weeks, you may have an MRI or CT scan to check your sinuses. These scans also help to determine how severe your infection is. In rare cases, a bone biopsy may be done to rule out more serious types of fungal sinus disease. How is this treated? Treatment for sinusitis depends on the cause and whether your condition  is chronic or acute. If a virus is causing your sinusitis, your symptoms will go away on their own within 10 days. You may be given medicines to relieve your symptoms, including:  Topical nasal decongestants. They shrink swollen nasal passages and let mucus drain from your sinuses.  Antihistamines. These drugs block inflammation that is triggered by allergies. This can help to ease swelling in your nose and sinuses.  Topical nasal  corticosteroids. These are nasal sprays that ease inflammation and swelling in your nose and sinuses.  Nasal saline washes. These rinses can help to get rid of thick mucus in your nose.  If your condition is caused by bacteria, you will be given an antibiotic medicine. If your condition is caused by a fungus, you will be given an antifungal medicine. Surgery may be needed to correct underlying conditions, such as narrow nasal passages. Surgery may also be needed to remove polyps. Follow these instructions at home: Medicines  Take, use, or apply over-the-counter and prescription medicines only as told by your health care provider. These may include nasal sprays.  If you were prescribed an antibiotic medicine, take it as told by your health care provider. Do not stop taking the antibiotic even if you start to feel better. Hydrate and Humidify  Drink enough water to keep your urine clear or pale yellow. Staying hydrated will help to thin your mucus.  Use a cool mist humidifier to keep the humidity level in your home above 50%.  Inhale steam for 10-15 minutes, 3-4 times a day or as told by your health care provider. You can do this in the bathroom while a hot shower is running.  Limit your exposure to cool or dry air. Rest  Rest as much as possible.  Sleep with your head raised (elevated).  Make sure to get enough sleep each night. General instructions  Apply a warm, moist washcloth to your face 3-4 times a day or as told by your health care provider. This will help with discomfort.  Wash your hands often with soap and water to reduce your exposure to viruses and other germs. If soap and water are not available, use hand sanitizer.  Do not smoke. Avoid being around people who are smoking (secondhand smoke).  Keep all follow-up visits as told by your health care provider. This is important. Contact a health care provider if:  You have a fever.  Your symptoms get worse.  Your  symptoms do not improve within 10 days. Get help right away if:  You have a severe headache.  You have persistent vomiting.  You have pain or swelling around your face or eyes.  You have vision problems.  You develop confusion.  Your neck is stiff.  You have trouble breathing. This information is not intended to replace advice given to you by your health care provider. Make sure you discuss any questions you have with your health care provider. Document Released: 04/18/2005 Document Revised: 12/13/2015 Document Reviewed: 02/11/2015 Elsevier Interactive Patient Education  [employer].

## 2018-02-27 NOTE — Progress Notes (Signed)
Subjective:  Patient ID: Christina Huerta, female    DOB: Sep 02, 1960  Age: 57 y.o. MRN: [id number]  CC: Headache (patient is complaining of headache,congestions thick mucus,sinus pressure--mostly left side. going on 2 days, taking mucinex. ) and Medication Refill (Celexa refill? [facility] to [facility]. )   Sinusitis  This is a recurrent problem. The current episode started more than 1 month ago. The problem has been waxing and waning since onset. There has been no fever. Associated symptoms include congestion, coughing, ear pain, headaches and sinus pressure. Pertinent negatives include no chills, diaphoresis, hoarse voice, neck pain, shortness of breath, sneezing, sore throat or swollen glands. Past treatments include acetaminophen and oral decongestants. The treatment provided mild relief.   treated with oral abx and prednisone 06/2017 and 09/2017. No improvement with Flonase and mucinex. Reports chronic allergic rhinitis due to environmental allergen (dust, grass pollen, mold). Has mold in home she is renting. Landlord has not addressed issue.  Depression and Anxiety: Worse in last 3months. Needs celexa refilled Worse in last 3months due to financial and family stressor: no health insurance due to loss of job,  Death of grandchild (43days old), poor health of husband and mother. Depression screen [facility] 2/9 02/27/2018 03/03/2017 07/25/2016  Decreased Interest 1 0 0  Down, Depressed, Hopeless 2 1 0  PHQ - 2 Score 3 1 0  Altered sleeping 2 2 -  Tired, decreased energy 3 2 -  Change in appetite 1 3 -  Feeling bad or failure about yourself  2 1 -  Trouble concentrating 0 0 -  Moving slowly or fidgety/restless 0 0 -  Suicidal thoughts 0 0 -  PHQ-9 Score 11 9 -   Reviewed past Medical, Social and Family history today.  Outpatient Medications Prior to Visit  Medication Sig Dispense Refill  . albuterol (PROVENTIL HFA;VENTOLIN HFA) 108 (90 Base) MCG/ACT inhaler Inhale 1-2 puffs into the lungs every 6  (six) hours as needed. 1 Inhaler 3  . Biotin 03474 MCG TABS Take 10,000 mg by mouth 2 (two) times daily.    . Black Cohosh 80 MG CAPS Take 80 mg by mouth 1 day or 1 dose.    Marland Kitchen CINNAMON PO Take by mouth.    . Cyanocobalamin (B-12 PO) Take by mouth.    . Digestive Enzymes (DIGESTIVE SUPPORT PO) Take by mouth. IB Gard OTC    . guaiFENesin (MUCINEX) 600 MG 12 hr tablet Take 1 tablet (600 mg total) by mouth 2 (two) times daily as needed for cough or to loosen phlegm. 14 tablet 0  . Multiple Vitamins-Minerals (AIRBORNE PO) Take by mouth.    . Multiple Vitamins-Minerals (MULTIVITAMIN ADULT PO) Take by mouth.    . omega-3 acid ethyl esters (LOVAZA) 1 g capsule Take 2 capsules (2 g total) by mouth daily. 180 capsule 1  . OVER THE COUNTER MEDICATION Vinegar Tab    . ST JOHNS WORT PO Take by mouth.    Marland Kitchen VITAMIN E PO Take by mouth.    . citalopram (CELEXA) 20 MG tablet Take 1 tablet (20 mg total) by mouth daily. 90 tablet 1  . diazepam (VALIUM) 5 MG tablet take 1 tablet by mouth three times a day as directed  0  . fenofibrate (TRICOR) 145 MG tablet Take 1 tablet (145 mg total) by mouth daily. (Patient not taking: Reported on 02/27/2018) 90 tablet 1  . Probiotic Product (ALIGN PO) Take by mouth 1 day or 1 dose.    . Psyllium (METAMUCIL FIBER  PO) Take by mouth 1 day or 1 dose.    Marland Kitchen amoxicillin-clavulanate (AUGMENTIN) 875-125 MG tablet Take 1 tablet by mouth 2 (two) times daily. (Patient not taking: Reported on 02/27/2018) 20 tablet 0  . cetirizine (ZYRTEC) 10 MG tablet Take 1 tablet (10 mg total) by mouth at bedtime. (Patient not taking: Reported on 02/27/2018) 14 tablet 0  . fluticasone (FLONASE) 50 MCG/ACT nasal spray Place 2 sprays into both nostrils daily. (Patient not taking: Reported on 02/27/2018) 16 g 0  . naproxen (NAPROSYN) 500 MG tablet Take 1 tablet (500 mg total) by mouth 2 (two) times daily as needed (for pain, take with food). (Patient not taking: Reported on 02/27/2018) 10 tablet 0  .  olopatadine (PATANOL) 0.1 % ophthalmic solution Place 1 drop into both eyes 2 (two) times daily. (Patient not taking: Reported on 02/27/2018) 5 mL 0  . promethazine-dextromethorphan (PROMETHAZINE-DM) 6.25-15 MG/5ML syrup Take 5 mLs by mouth 4 (four) times daily as needed for cough. (Patient not taking: Reported on 02/27/2018) 150 mL 0  . traMADol (ULTRAM) 50 MG tablet Take 1 tablet (50 mg total) by mouth every 6 (six) hours as needed for moderate pain. (Patient not taking: Reported on 02/27/2018) 30 tablet 0   No facility-administered medications prior to visit.     ROS See HPI  Objective:  BP (!) 138/98   Pulse 86   Temp 98.7 F (37.1 C) (Oral)   Ht 5\' 9"  (1.753 m)   Wt 236 lb (107 kg)   SpO2 96%   BMI 34.85 kg/m   BP Readings from Last 3 Encounters:  02/27/18 (!) 138/98  10/11/17 (!) 118/94  09/22/17 130/90    Wt Readings from Last 3 Encounters:  02/27/18 236 lb (107 kg)  10/11/17 228 lb 12.8 oz (103.8 kg)  09/22/17 233 lb 6.4 oz (105.9 kg)    Physical Exam  HENT:  Right Ear: Tympanic membrane, external ear and ear canal normal.  Left Ear: Tympanic membrane, external ear and ear canal normal.  Nose: Mucosal edema and rhinorrhea present. Right sinus exhibits maxillary sinus tenderness and frontal sinus tenderness. Left sinus exhibits maxillary sinus tenderness and frontal sinus tenderness.  Mouth/Throat: Uvula is midline. Posterior oropharyngeal erythema present. No oropharyngeal exudate or posterior oropharyngeal edema. Tonsils are 0 on the right. Tonsils are 0 on the left.  Neck: Normal range of motion. Neck supple.  Cardiovascular: Normal rate and regular rhythm.  Pulmonary/Chest: Effort normal and breath sounds normal.  Musculoskeletal: She exhibits edema.  Psychiatric: Her speech is slurred. Cognition and memory are normal. She exhibits a depressed mood. She expresses no homicidal and no suicidal ideation. She expresses no suicidal plans and no homicidal plans.    Tearful when talking about death of grandchild  Vitals reviewed.  Lab Results  Component Value Date   GLUCOSE 98 07/27/2016   CHOL 275 (H) 03/03/2017   TRIG 258.0 (H) 03/03/2017   HDL 49.80 03/03/2017   LDLDIRECT 169.0 03/03/2017   ALT 26 03/03/2017   AST 18 03/03/2017   NA 139 07/27/2016   K 3.8 07/27/2016   CL 101 07/27/2016   CREATININE 0.65 07/27/2016   BUN 9 07/27/2016   CO2 29 07/27/2016   TSH 2.07 07/27/2016   HGBA1C 5.7 07/27/2016    Mr Knee Right Wo Contrast  Result Date: 03/15/2017 CLINICAL DATA:  Chronic right knee pain since a fall in July 2018. EXAM: MRI OF THE RIGHT KNEE WITHOUT CONTRAST TECHNIQUE: Multiplanar, multisequence MR imaging of the knee  was performed. No intravenous contrast was administered. COMPARISON:  Radiographs dated 12/14/2016 FINDINGS: MENISCI Medial meniscus: There is a complete radial tear of the root of the posterior horn and 10 trick degeneration of the posterior horn the midbody within the far peripheral undersurface longitudinal tear best seen on series 8. Lateral meniscus: Minimal blunting of the free edge of the midbody. Otherwise normal. LIGAMENTS Cruciates:  Intact ACL and PCL. Collaterals: Medial collateral ligament is intact. Lateral collateral ligament complex is intact. CARTILAGE Patellofemoral: Small focal areas of full-thickness cartilage loss of the medial and lateral facets of the patella. Medial: Small areas of partial-thickness cartilage loss on the medial femoral condyle. Lateral: Small focal area of partial-thickness cartilage loss of the posterior aspect of the lateral tibial plateau. Joint:  Moderate joint effusion. Popliteal Fossa: 15 x 15 x 55 mm Baker's cyst. Intact popliteus tendon. Multiple small ganglion cysts posterior to the posterior cruciate ligament. Extensor Mechanism:  Normal. Bones: Small focal area of subcortical edema at the posterior peripheral aspect of the medial femoral condyle probably secondary to overlying  cartilage loss. Other: None IMPRESSION: 1. Complete radial tear of the root of the posterior horn of the medial meniscus with secondary peripheral subluxation of the meniscus. 2. Small peripheral undersurface tear of the posterior horn and midbody of the medial meniscus. 3. Tricompartmental cartilage defects as described. 4. Moderate effusion with small to moderate Baker's cyst. Electronically Signed   By: Francene Boyers M.D.   On: 03/15/2017 09:34    Assessment & Plan:   Evelene was seen today for headache and medication refill.  Diagnoses and all orders for this visit:  Moderate episode of recurrent major depressive disorder (HCC) -     Basic metabolic panel -     Hepatic function panel  Recurrent sinusitis -     Cancel: DG SinUS 1-2 Views -     fluticasone (FLONASE) 50 MCG/ACT nasal spray; Place 2 sprays into both nostrils daily. -     cetirizine (ZYRTEC) 10 MG tablet; Take 1 tablet (10 mg total) by mouth at bedtime. -     montelukast (SINGULAIR) 10 MG tablet; Take 1 tablet (10 mg total) by mouth at bedtime. -     methylPREDNISolone (MEDROL DOSEPAK) 4 MG TBPK tablet; Take as directed on package  Allergic rhinitis due to other allergic trigger, unspecified seasonality -     fluticasone (FLONASE) 50 MCG/ACT nasal spray; Place 2 sprays into both nostrils daily. -     cetirizine (ZYRTEC) 10 MG tablet; Take 1 tablet (10 mg total) by mouth at bedtime. -     montelukast (SINGULAIR) 10 MG tablet; Take 1 tablet (10 mg total) by mouth at bedtime. -     methylPREDNISolone (MEDROL DOSEPAK) 4 MG TBPK tablet; Take as directed on package  Recurrent major depressive disorder, remission status unspecified (HCC) -     citalopram (CELEXA) 20 MG tablet; Take 1 tablet (20 mg total) by mouth daily.   I have discontinued Kiyara Bottari's naproxen, traMADol, olopatadine, amoxicillin-clavulanate, and promethazine-dextromethorphan. I am also having her start on montelukast and methylPREDNISolone. Additionally, I  am having her maintain her Probiotic Product (ALIGN PO), Psyllium (METAMUCIL FIBER PO), Biotin, Black Cohosh, Multiple Vitamins-Minerals (MULTIVITAMIN ADULT PO), Digestive Enzymes (DIGESTIVE SUPPORT PO), diazepam, fenofibrate, omega-3 acid ethyl esters, guaiFENesin, Multiple Vitamins-Minerals (AIRBORNE PO), albuterol, OVER THE COUNTER MEDICATION, CINNAMON PO, VITAMIN E PO, Cyanocobalamin (B-12 PO), ST JOHNS WORT PO, fluticasone, cetirizine, and citalopram.  Meds ordered this encounter  Medications  . fluticasone (  FLONASE) 50 MCG/ACT nasal spray    Sig: Place 2 sprays into both nostrils daily.    Dispense:  16 g    Refill:  0    Order Specific Question:   Supervising Provider    Answer:   Dianne Dun [[id number]]  . cetirizine (ZYRTEC) 10 MG tablet    Sig: Take 1 tablet (10 mg total) by mouth at bedtime.    Dispense:  30 tablet    Refill:  0    Order Specific Question:   Supervising Provider    Answer:   Dianne Dun [[id number]]  . montelukast (SINGULAIR) 10 MG tablet    Sig: Take 1 tablet (10 mg total) by mouth at bedtime.    Dispense:  30 tablet    Refill:  3    Order Specific Question:   Supervising Provider    Answer:   Dianne Dun [[id number]]  . methylPREDNISolone (MEDROL DOSEPAK) 4 MG TBPK tablet    Sig: Take as directed on package    Dispense:  21 tablet    Refill:  0    Order Specific Question:   Supervising Provider    Answer:   Dianne Dun [[id number]]  . citalopram (CELEXA) 20 MG tablet    Sig: Take 1 tablet (20 mg total) by mouth daily.    Dispense:  90 tablet    Refill:  1    Order Specific Question:   Supervising Provider    Answer:   Dianne Dun [[id number]]    Follow-up: Return in about 4 weeks (around 03/27/2018) for CPE(fasting) and re eval depression and grief.  [name], NP

## 2018-03-09 ENCOUNTER — Telehealth: Payer: Self-pay | Admitting: Nurse Practitioner

## 2018-03-09 ENCOUNTER — Ambulatory Visit: Payer: Self-pay | Admitting: *Deleted

## 2018-03-09 DIAGNOSIS — J329 Chronic sinusitis, unspecified: Secondary | ICD-10-CM

## 2018-03-09 MED ORDER — LEVOFLOXACIN 500 MG PO TABS
500.0000 mg | ORAL_TABLET | Freq: Every day | ORAL | 0 refills | Status: AC
Start: 2018-03-09 — End: 2018-03-16

## 2018-03-09 NOTE — Telephone Encounter (Signed)
levaquin sent. She need CT sinus if symptoms do not improve or reoccur within next 3months.

## 2018-03-09 NOTE — Telephone Encounter (Signed)
Charlotte please advise, last office visit with Nche was 02/27/18.

## 2018-03-09 NOTE — Telephone Encounter (Signed)
Contacted pt regarding symptoms; she said that her sinus pain has moved to the right side; she is having congestion but no drainage and a sinus headache; she has been alternating tylenol and motrin; and heat/ice with little relief; recommendations made per nurse triage; further explained to the pt that based on her symptoms the provider may want to see her in the office; also offered the pt the opportunity to be seen at [facility]; she declines because she has to go out of town on 03/10/18; the pt would like to know if a prescription can be called in; she can be contacted at [phone] and a detailed message can be left on voicemail; her pharmacy of choice is [facility] [address] [place]; she was last seen by [name], [facility] 02/27/18; twill route to office for final disposition.    Reason for Disposition . [1] Sinus congestion (pressure, fullness) AND [2] present > 10 days  Answer Assessment - Initial Assessment Questions 1. LOCATION: "Where does it hurt?"      Around right eye 2. ONSET: "When did the sinus pain start?"  (e.g., hours, days)      03/06/18 3. SEVERITY: "How bad is the pain?"   (Scale 1-10; mild, moderate or severe)   - MILD (1-3): doesn't interfere with normal activities    - MODERATE (4-7): interferes with normal activities (e.g., work or school) or awakens from sleep   - SEVERE (8-10): excruciating pain and patient unable to do any normal activities        Rated 7-8 out of 10; hard to concentrate 4. RECURRENT SYMPTOM: "Have you ever had sinus problems before?" If so, ask: "When was the last time?" and "What happened that time?"      Yes; seen in office 10/ 5. NASAL CONGESTION: "Is the nose blocked?" If so, ask, "Can you open it or must you breathe through the mouth?"     Yes can breath through mouth 6. NASAL DISCHARGE: "Do you have discharge from your nose?" If so ask, "What color?"     no 7. FEVER: "Do you have a fever?" If so, ask: "What is it,  how was it measured, and when did it start?"     no 8. OTHER SYMPTOMS: "Do you have any other symptoms?" (e.g., sore throat, cough, earache, difficulty breathing)    bilateral nasal congestion, coughing at night esp when laying down, right ear burning inside  9. PREGNANCY: "Is there any chance you are pregnant?" "When was your last menstrual period?"     No  Protocols used: SINUS PAIN OR CONGESTION-A-AH

## 2018-03-09 NOTE — Telephone Encounter (Signed)
Pt is aware.  

## 2018-03-09 NOTE — Addendum Note (Signed)
Addended by: [name] L on: 03/09/2018 03:41 PM   Modules accepted: Orders

## 2018-03-09 NOTE — Telephone Encounter (Signed)
Copied from CRM [id number]. Topic: Quick Communication - Rx Refill/Question >> Mar 09, 2018  2:01 PM [place], [place] D wrote: Medication: methylPREDNISolone (MEDROL DOSEPAK) 4 MG TBPK tablet / Pt states sinus pain has moved from left side to right side of her head and would like to have another prescription for Medrol as it seemed to help the first time. Please advise. CB#[phone]  Has the patient contacted their pharmacy? Yes.   (Agent: If no, request that the patient contact the pharmacy for the refill.) (Agent: If yes, when and what did the pharmacy advise?)  Preferred Pharmacy (with phone number or street name): [id number] [place], [place] - [phone] AT [facility] [address] & [address] [phone] (Phone) [phone] (Fax)    Agent: Please be advised that RX refills may take up to 3 business days. We ask that you follow-up with your pharmacy.

## 2018-06-28 ENCOUNTER — Other Ambulatory Visit: Payer: Self-pay | Admitting: Nurse Practitioner

## 2018-06-28 DIAGNOSIS — F339 Major depressive disorder, recurrent, unspecified: Secondary | ICD-10-CM

## 2018-07-31 ENCOUNTER — Ambulatory Visit: Payer: Self-pay | Admitting: Nurse Practitioner

## 2018-07-31 NOTE — Telephone Encounter (Signed)
Pt c/o sneezing, post nasal drip and occasional cough. Pt stated that when she comes in after being outside, she will sneeze for "15 minutes." Pt stated she has these symptoms every Spring and Fall. Pt has tried Benadryl, Claritin, Zyrtec, afrin. Pt stated she is getting no relief. Pt stated that she is having an occasional cough from post nasal drainage. Denies fever, SOB or difficulty breathing, sore throat. Pt stated she has mold in her bedroom.  Care advice given and pt verbalized understanding. Pt given an appt for tomorrow morning. Pt's e-mail and phone number verified. Pt informed that visit could be virtual or over the phone. Pt verbalized understanding.       Reason for Disposition . [1] Taking antihistamines > 2 days AND [2] nasal allergy symptoms interfere with sleep, school, or work  Answer Assessment - Initial Assessment Questions 1. SYMPTOM: "What's the main symptom you're concerned about?" (e.g., runny nose, stuffiness, sneezing, itching)     Sneezing, post nasal drip, cough 2. SEVERITY: "How bad is it?" "What does it keep you from doing?" (e.g., sleeping, working)      Cannot stop sneezing 3. EYES: "Are the eyes also red, watery, and itchy?"      No- eye discharge in the morning 4. TRIGGER: "What pollen or other allergic substance do you think is causing the symptoms?"      Pollen, every time goes outside when pt comes in to building she stated she will sneeze for 15 minutes 5. TREATMENT: "What medicine are you using?" "What medicine worked best in the past?"     Claritin- 6. OTHER SYMPTOMS: "Do you have any other symptoms?" (e.g., coughing, difficulty breathing, wheezing)     Occasional cough PND 7. PREGNANCY: "Is there any chance you are pregnant?" "When was your last menstrual period?"     n/a  Protocols used: NASAL ALLERGIES (HAY FEVER)-A-AH

## 2018-08-01 ENCOUNTER — Ambulatory Visit (INDEPENDENT_AMBULATORY_CARE_PROVIDER_SITE_OTHER): Payer: Self-pay | Admitting: Nurse Practitioner

## 2018-08-01 ENCOUNTER — Encounter: Payer: Self-pay | Admitting: Nurse Practitioner

## 2018-08-01 ENCOUNTER — Other Ambulatory Visit: Payer: Self-pay

## 2018-08-01 VITALS — Temp 97.1°F | Ht 69.0 in | Wt 222.0 lb

## 2018-08-01 DIAGNOSIS — J301 Allergic rhinitis due to pollen: Secondary | ICD-10-CM

## 2018-08-01 DIAGNOSIS — J4521 Mild intermittent asthma with (acute) exacerbation: Secondary | ICD-10-CM

## 2018-08-01 DIAGNOSIS — J329 Chronic sinusitis, unspecified: Secondary | ICD-10-CM

## 2018-08-01 MED ORDER — MONTELUKAST SODIUM 10 MG PO TABS: 10.0000 mg | ORAL_TABLET | Freq: Every day | ORAL | 3 refills | Status: DC

## 2018-08-01 MED ORDER — BUDESONIDE-FORMOTEROL FUMARATE 160-4.5 MCG/ACT IN AERO: 1.0000 | INHALATION_SPRAY | Freq: Two times a day (BID) | RESPIRATORY_TRACT | 1 refills | Status: DC

## 2018-08-01 MED ORDER — AZITHROMYCIN 250 MG PO TABS: 250.0000 mg | ORAL_TABLET | Freq: Every day | ORAL | 0 refills | Status: DC

## 2018-08-01 MED ORDER — FLUTICASONE PROPIONATE 50 MCG/ACT NA SUSP: 2.0000 | Freq: Every day | NASAL | 0 refills | Status: DC

## 2018-08-01 MED ORDER — DM-GUAIFENESIN ER 30-600 MG PO TB12: 1.0000 | ORAL_TABLET | Freq: Two times a day (BID) | ORAL | 0 refills | Status: DC | PRN

## 2018-08-01 NOTE — Patient Instructions (Signed)
Continue saline sinus rinse, mucinex and zrytec. Maintain adequate oral hydration. Call office if no improvement in 2weeks.

## 2018-08-01 NOTE — Progress Notes (Signed)
Virtual Visit via Video Note  I connected with Christina Huerta on 08/01/18 at  8:00 AM EDT by a video enabled telemedicine application and verified that I am speaking with the correct person using two identifiers.   I discussed the limitations of evaluation and management by telemedicine and the availability of in person appointments. The patient expressed understanding and agreed to proceed.  History of Present Illness: Cough  This is a recurrent problem. The current episode started 1 to 4 weeks ago. The problem has been waxing and waning. The problem occurs constantly. The cough is productive of sputum. Associated symptoms include ear congestion, ear pain, headaches, nasal congestion, postnasal drip, rhinorrhea, a sore throat, shortness of breath and wheezing. Pertinent negatives include no chest pain, chills, fever, heartburn, hemoptysis, myalgias, sweats or weight loss. The symptoms are aggravated by lying down, pollens and cold air. She has tried a beta-agonist inhaler and OTC cough suppressant for the symptoms. The treatment provided mild relief. Her past medical history is significant for asthma, bronchitis and environmental allergies.  use of albuterol inhaler 1-2times a week.   Reviewed medications and problem list.  Observations/Objective: Unable to provide BP and pulse. Reviewed weigh and temp provided. Physical Exam  Constitutional: She is oriented to person, place, and time. No distress.  HENT:  Right Ear: External ear normal.  Left Ear: External ear normal.  Mouth/Throat: Uvula is midline. No trismus in the jaw. Posterior oropharyngeal edema present. No oropharyngeal exudate.  Pulmonary/Chest: Effort normal.  Intermittent coughing during video call  Neurological: She is alert and oriented to person, place, and time.  Psychiatric: She has a normal mood and affect. Her behavior is normal.  Vitals reviewed.   Assessment and Plan: Dashaya was seen today for cough.  Diagnoses and  all orders for this visit:  Mild intermittent asthma with acute exacerbation -     budesonide-formoterol (SYMBICORT) 160-4.5 MCG/ACT inhaler; Inhale 1 puff into the lungs 2 (two) times daily. Rinse month after each use  Chronic recurrent sinusitis -     dextromethorphan-guaiFENesin (MUCINEX DM) 30-600 MG 12hr tablet; Take 1 tablet by mouth 2 (two) times daily as needed for cough. -     fluticasone (FLONASE) 50 MCG/ACT nasal spray; Place 2 sprays into both nostrils daily. -     azithromycin (ZITHROMAX Z-PAK) 250 MG tablet; Take 1 tablet (250 mg total) by mouth daily. Take 2tabs on first day, then 1tab once a day till complete -     montelukast (SINGULAIR) 10 MG tablet; Take 1 tablet (10 mg total) by mouth at bedtime.  Seasonal allergic rhinitis due to pollen -     fluticasone (FLONASE) 50 MCG/ACT nasal spray; Place 2 sprays into both nostrils daily. -     montelukast (SINGULAIR) 10 MG tablet; Take 1 tablet (10 mg total) by mouth at bedtime.   Follow Up Instructions: Continue saline sinus rinse, mucinex and zrytec. Maintain adequate oral hydration. Call office if no improvement in 2weeks.   I discussed the assessment and treatment plan with the patient. The patient was provided an opportunity to ask questions and all were answered. The patient agreed with the plan and demonstrated an understanding of the instructions.   The patient was advised to call back or seek an in-person evaluation if the symptoms worsen or if the condition fails to improve as anticipated.  [name], NP

## 2018-08-13 ENCOUNTER — Telehealth: Payer: Self-pay | Admitting: Internal Medicine

## 2018-08-13 ENCOUNTER — Other Ambulatory Visit: Payer: Self-pay | Admitting: Internal Medicine

## 2018-08-13 DIAGNOSIS — J4521 Mild intermittent asthma with (acute) exacerbation: Secondary | ICD-10-CM

## 2018-08-13 MED ORDER — PREDNISONE 10 MG PO TABS: ORAL_TABLET | ORAL | 0 refills | Status: DC

## 2018-08-13 MED ORDER — ALBUTEROL SULFATE HFA 108 (90 BASE) MCG/ACT IN AERS: 1.0000 | INHALATION_SPRAY | Freq: Four times a day (QID) | RESPIRATORY_TRACT | 3 refills | Status: DC | PRN
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
## 2018-08-13 NOTE — Telephone Encounter (Signed)
Per Dr. Darrick Huntsman request the pt has been scheduled for doxy.me appt tomorrow at 12:30pm. Pt is aware of appt date and time.

## 2018-08-13 NOTE — Progress Notes (Signed)
See telephone note.

## 2018-08-14 ENCOUNTER — Ambulatory Visit (INDEPENDENT_AMBULATORY_CARE_PROVIDER_SITE_OTHER): Payer: Self-pay | Admitting: Internal Medicine

## 2018-08-14 DIAGNOSIS — J4521 Mild intermittent asthma with (acute) exacerbation: Secondary | ICD-10-CM

## 2018-08-14 DIAGNOSIS — J329 Chronic sinusitis, unspecified: Secondary | ICD-10-CM

## 2018-08-14 DIAGNOSIS — J301 Allergic rhinitis due to pollen: Secondary | ICD-10-CM

## 2018-08-14 NOTE — Patient Instructions (Signed)
I'm glad you are feeling better!  If you can afford one more inhaler,  I recommend QVAR.  It is an inhaled steroid and may get you through the pollen season.  I have prescribed it to you so you know what to look for   I recommend doubling your zyrtec to every 12 hours,  And taking generic benadryl (dipenhydramine) at bedtime ONLY,  To help with the sinus drainage  If you develop facial pain,  Green/blood nasal drainage,  And fevers , you will need to be treated for 2 weeks with antibiotics for "chronic sinusitis."  You should irrigating your sinuses with an OTC product called NeilMed's Sinus rinse ;  It is a strong sinus "flush" using water and medicated salts.  Do it over the sink because it can be a bit messy
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
## 2018-08-15 NOTE — Assessment & Plan Note (Signed)
Her current symptoms do not suggest an active infection.

## 2018-08-15 NOTE — Assessment & Plan Note (Signed)
Improving with prednisone and albuterol,  Prescribing Qvar as an inexpensive alternative to Symbicort.
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
## 2018-08-22 ENCOUNTER — Telehealth: Payer: Self-pay

## 2018-08-22 ENCOUNTER — Ambulatory Visit: Payer: Self-pay | Admitting: *Deleted

## 2018-08-22 ENCOUNTER — Other Ambulatory Visit: Payer: Self-pay | Admitting: Internal Medicine

## 2018-08-22 MED ORDER — BECLOMETHASONE DIPROP HFA 80 MCG/ACT IN AERB: 2.0000 | INHALATION_SPRAY | Freq: Two times a day (BID) | RESPIRATORY_TRACT | 5 refills | Status: DC

## 2018-08-22 NOTE — Telephone Encounter (Signed)
Copied from CRM [id number]. Topic: General - Other >> Aug 22, 2018 10:59 AM Debroah Loop wrote: Reason for CRM: Spoke with Mal Amabile who confirmed he will forward a message to Dr. Darrick Huntsman to call in QVAR to Karin Golden at [facility] location for the patient.

## 2018-08-22 NOTE — Telephone Encounter (Signed)
Medication sent to pharmacy. FYI

## 2018-08-22 NOTE — Telephone Encounter (Signed)
I don't see this in the pt's chart.

## 2018-08-22 NOTE — Addendum Note (Signed)
Addended byElise Benne T on: 08/22/2018 02:49 PM   Modules accepted: Orders

## 2018-08-22 NOTE — Telephone Encounter (Signed)
Patient called and stated that she never received her rx for Qvar from Dr Darrick Huntsman.  Seen by Dr Darrick Huntsman virtually. Please send medication

## 2018-08-22 NOTE — Telephone Encounter (Signed)
Patient has thick phlegm at the back of her throat. Very difficult to get up. No fever. Has some sinus facial pressure along with headaches. No ear/throat/chest pain. Has used the nasal flushes with nothing coming out. Recently completed prednisone for her sinuses. She has began using nasonex recently. Recommended mucinex beginning today with much increased water intake. If no change in 24 hours, please phone back for a virtual visit.Reviewed additional symptoms which to call back if occurred. Offered a virtual visit for today-she declined. Stated she understood.  Reason for Disposition . [1] Sinus congestion as part of a cold AND [2] present < 10 days  Answer Assessment - Initial Assessment Questions 1. LOCATION: "Where does it hurt?"      Feels a glob at the base of her throat.  2. ONSET: "When did the sinus pain start?"  (e.g., hours, days)      One week ago at least 3. SEVERITY: "How bad is the pain?"   (Scale 1-10; mild, moderate or severe)   - MILD (1-3): doesn't interfere with normal activities    - MODERATE (4-7): interferes with normal activities (e.g., work or school) or awakens from sleep   - SEVERE (8-10): excruciating pain and patient unable to do any normal activities        mild 4. RECURRENT SYMPTOM: "Have you ever had sinus problems before?" If so, ask: "When was the last time?" and "What happened that time?"      Yes, recently treated for sinuses 5. NASAL CONGESTION: "Is the nose blocked?" If so, ask, "Can you open it or must you breathe through the mouth?"     no 6. NASAL DISCHARGE: "Do you have discharge from your nose?" If so ask, "What color?"     All from coughing up when she is able to get any up 7. FEVER: "Do you have a fever?" If so, ask: "What is it, how was it measured, and when did it start?"      no 8. OTHER SYMPTOMS: "Do you have any other symptoms?" (e.g., sore throat, cough, earache, difficulty breathing)     None listed. Some sinus face pressure and mild  headaches 9. PREGNANCY: "Is there any chance you are pregnant?" "When was your last menstrual period?"     no  Protocols used: SINUS PAIN OR CONGESTION-A-AH

## 2018-08-22 NOTE — Progress Notes (Signed)
   I don't see it either~  My  Apologies  to patient!  I have sent rx for generic q var (beclomethasone)  to [facility]

## 2018-08-22 NOTE — Telephone Encounter (Signed)
I don't see this in the pt's chart.  

## 2018-08-22 NOTE — Telephone Encounter (Addendum)
As she's uninsured, she can work with [facility] to complete patient assistance for this medication.   I will send a message to Milagros Reap, PharmD at [facility] to ask to outreach patient to discuss financial eligibility. Can we please send the Qvar prescription to [facility]? I have added as a preferred pharmacy in Epic.

## 2018-08-22 NOTE — Telephone Encounter (Signed)
Dr Darrick Huntsman saw patient and gave her qvar recently.  Is there a way she can get for free? From the manufacture?

## 2018-08-22 NOTE — Telephone Encounter (Signed)
Left message letting pt know that the Qvar inhaler has been sent in.

## 2018-08-23 MED ORDER — MOMETASONE FURO-FORMOTEROL FUM 100-5 MCG/ACT IN AERO: 2.0000 | INHALATION_SPRAY | Freq: Two times a day (BID) | RESPIRATORY_TRACT | 11 refills | Status: DC

## 2018-08-23 NOTE — Telephone Encounter (Signed)
Received message from [facility] that they do not have Qvar or Symbicort in stock, so it would take 3-4 weeks to get from drug manufacturer.   They do have Advair 250/50 and Dulera 157mcg/5mcg in stock right now and could dispense to patient. Routing to Dr. Darrick Huntsman to see if OK to switch to one of these instead of Qvar or Symbicort.

## 2018-08-23 NOTE — Telephone Encounter (Signed)
Either are fine,  She has no insurance so the cost is the only issue

## 2018-08-23 NOTE — Telephone Encounter (Signed)
RX FOR DULERA SENT TO [facility] ,  THANKS FOR YOUR HELP!

## 2018-08-23 NOTE — Addendum Note (Signed)
Addended by: Sherlene Shams on: 08/23/2018 12:24 PM   Modules accepted: Orders

## 2018-08-23 NOTE — Telephone Encounter (Signed)
[facility] can apply for patient assistance for either, which she can get for free. They just happen to have these two in stock that they could dispense to her immediately, while patient assistance is processing

## 2018-08-24 ENCOUNTER — Ambulatory Visit: Payer: Self-pay | Admitting: Pharmacy Technician

## 2018-08-24 ENCOUNTER — Other Ambulatory Visit: Payer: Self-pay

## 2018-08-24 DIAGNOSIS — Z79899 Other long term (current) drug therapy: Secondary | ICD-10-CM

## 2018-08-24 NOTE — Progress Notes (Signed)
Phone consult conducted to determine eligibility.  Patient indicated that today, 4/24 is her last day of employment with [employer].  Patient is married.  Patient verbally provided spouse's gross monthly income, which is around 300% [employer].  The income for the spouse exceeds [facility]'s eligibility criteria of 250% [employer].  Patient understands that she does not meet [facility]'s eligibility criteria.    Pt is mainly concerned with obtaining her QVAR medication.  [employer] offers a PAP Program for QVAR, and [employer] is around 300% in order to obtain medication assistance from [facility].  Made patient aware that I was sending a referral to Catie Feliz Beam to assist patient with completion of the PAP application for QVAR.  Sherilyn Dacosta Care Manager [facility]

## 2018-12-19 ENCOUNTER — Telehealth: Payer: Self-pay | Admitting: Nurse Practitioner

## 2018-12-19 DIAGNOSIS — F339 Major depressive disorder, recurrent, unspecified: Secondary | ICD-10-CM

## 2018-12-19 NOTE — Telephone Encounter (Signed)
I sent a refill already. I noted that she will not be able to get any additional refill with out an office visit

## 2018-12-19 NOTE — Telephone Encounter (Signed)
Baldo Ash please advise, last ov for this problem was 02/27/2018, last refill was 06/28/2018 for 90 tab with 1 refills. No future appt made

## 2019-01-04 ENCOUNTER — Encounter: Payer: Self-pay | Admitting: Nurse Practitioner

## 2019-01-04 ENCOUNTER — Ambulatory Visit: Payer: Self-pay | Admitting: Nurse Practitioner

## 2019-01-04 ENCOUNTER — Other Ambulatory Visit: Payer: Self-pay

## 2019-01-04 VITALS — BP 122/96 | HR 85 | Temp 97.9°F | Ht 69.0 in | Wt 227.8 lb

## 2019-01-04 DIAGNOSIS — B309 Viral conjunctivitis, unspecified: Secondary | ICD-10-CM

## 2019-01-04 DIAGNOSIS — H938X2 Other specified disorders of left ear: Secondary | ICD-10-CM

## 2019-01-04 MED ORDER — OLOPATADINE HCL 0.1 % OP SOLN: 1.0000 [drp] | Freq: Two times a day (BID) | OPHTHALMIC | 0 refills | Status: DC

## 2019-01-04 MED ORDER — PREDNISONE 10 MG (21) PO TBPK: ORAL_TABLET | ORAL | 0 refills | Status: DC

## 2019-01-04 NOTE — Patient Instructions (Addendum)
Symptoms could be due to meniere's disease vs labyrinthitis?  I also recommend evaluation by ENT. She states she is unable to afford appt with specialist due to lack of insurance coverage.  Earache, Adult An earache, or ear pain, can be caused by many things, including:  An infection.  Ear wax buildup.  Ear pressure.  Something in the ear that should not be there (foreign body).  A sore throat.  Tooth problems.  Jaw problems. Treatment of the earache will depend on the cause. If the cause is not clear or cannot be determined, you may need to watch your symptoms until your earache goes away or until a cause is found. Follow these instructions at home: Pay attention to any changes in your symptoms. Take these actions to help with your pain:  Take or apply over-the-counter and prescription medicines only as told by your health care provider.  If you were prescribed an antibiotic medicine, use it as told by your health care provider. Do not stop using the antibiotic even if you start to feel better.  Do not put anything in your ear other than medicine that is prescribed by your health care provider.  If directed, apply heat to the affected area as often as told by your health care provider. Use the heat source that your health care provider recommends, such as a moist heat pack or a heating pad. ? Place a towel between your skin and the heat source. ? Leave the heat on for 20-30 minutes. ? Remove the heat if your skin turns bright red. This is especially important if you are unable to feel pain, heat, or cold. You may have a greater risk of getting burned.  If directed, put ice on the ear: ? Put ice in a plastic bag. ? Place a towel between your skin and the bag. ? Leave the ice on for 20 minutes, 2-3 times a day.  Try resting in an upright position instead of lying down. This may help to reduce pressure in your ear and relieve pain.  Chew gum if it helps to relieve your ear  pain.  Treat any allergies as told by your health care provider.  Keep all follow-up visits as told by your health care provider. This is important. Contact a health care provider if:  Your pain does not improve within 2 days.  Your earache gets worse.  You have new symptoms.  You have a fever. Get help right away if:  You have a severe headache.  You have a stiff neck.  You have trouble swallowing.  You have redness or swelling behind your ear.  You have fluid or blood coming from your ear.  You have hearing loss.  You feel dizzy. This information is not intended to replace advice given to you by your health care provider. Make sure you discuss any questions you have with your health care provider. Document Released: 12/04/2003 Document Revised: 03/31/2017 Document Reviewed: 10/12/2015 Elsevier Patient Education  2020 [employer].

## 2019-01-04 NOTE — Progress Notes (Signed)
Subjective:  Patient ID: Christina Huerta, female    DOB: Jul 20, 1960  Age: 58 y.o. MRN: [id number]  CC: Ear Fullness (pt is c/o ear fullness,painful,itchy,feel like water inside/couple days/used heat and home drops. )  Ear Fullness  There is pain in the left ear. This is a recurrent problem. The current episode started 1 to 4 weeks ago. The problem occurs constantly. The problem has been waxing and waning. There has been no fever. Pertinent negatives include no abdominal pain, coughing, diarrhea, ear discharge, headaches, hearing loss, neck pain, rash, rhinorrhea, sore throat or vomiting. Associated symptoms comments: Also describes roaring sound in ears. She has tried ear drops for the symptoms. The treatment provided no relief. There is no history of a chronic ear infection, hearing loss or a tympanostomy tube.  Eye Problem  The right eye is affected. This is a new problem. The current episode started yesterday. The problem occurs constantly. The problem has been unchanged. There was no injury mechanism. There is no known exposure to pink eye. She does not wear contacts. Associated symptoms include a foreign body sensation and itching. Pertinent negatives include no blurred vision, eye discharge, double vision, eye redness, fever, nausea, photophobia, recent URI or vomiting. She has tried nothing for the symptoms.  hx of vertigo, but not at this time. Hx of recurrent sinusitis and left otitis media  Reviewed past Medical, Social and Family history today.  Outpatient Medications Prior to Visit  Medication Sig Dispense Refill  . albuterol (PROVENTIL HFA;VENTOLIN HFA) 108 (90 Base) MCG/ACT inhaler Inhale 1-2 puffs into the lungs every 6 (six) hours as needed. 1 Inhaler 3  . Biotin 43154 MCG TABS Take 10,000 mg by mouth 2 (two) times daily.    . Black Cohosh 80 MG CAPS Take 80 mg by mouth 1 day or 1 dose.    Marland Kitchen CINNAMON PO Take by mouth.    . citalopram (CELEXA) 20 MG tablet Take 1 tablet (20 mg  total) by mouth daily. No further refills without office visit 90 tablet 1  . diazepam (VALIUM) 5 MG tablet take 1 tablet by mouth three times a day as directed  0  . Multiple Vitamins-Minerals (MULTIVITAMIN ADULT PO) Take by mouth.    . omega-3 acid ethyl esters (LOVAZA) 1 g capsule Take 2 capsules (2 g total) by mouth daily. 180 capsule 1  . Omega-3 Fatty Acids (OMEGA-3 FISH OIL PO) Take by mouth.    Marland Kitchen OVER THE COUNTER MEDICATION Vinegar Tab    . OVER THE COUNTER MEDICATION Turmeric tablet    . VITAMIN E PO Take by mouth.    . beclomethasone (QVAR) 80 MCG/ACT inhaler Inhale 2 puffs into the lungs 2 (two) times daily. (Patient not taking: Reported on 01/04/2019) 10.6 g 5  . budesonide-formoterol (SYMBICORT) 160-4.5 MCG/ACT inhaler Inhale 1 puff into the lungs 2 (two) times daily. Rinse month after each use (Patient not taking: Reported on 01/04/2019) 1 Inhaler 1  . cetirizine (ZYRTEC) 10 MG tablet Take 1 tablet (10 mg total) by mouth at bedtime. (Patient not taking: Reported on 01/04/2019) 30 tablet 0  . fluticasone (FLONASE) 50 MCG/ACT nasal spray Place 2 sprays into both nostrils daily. (Patient not taking: Reported on 01/04/2019) 16 g 0  . mometasone-formoterol (DULERA) 100-5 MCG/ACT AERO Inhale 2 puffs into the lungs 2 (two) times daily at 10 AM and 5 PM. (Patient not taking: Reported on 01/04/2019) 8.8 g 11  . predniSONE (DELTASONE) 10 MG tablet 6 tablets daily for 3  days , then reduce by 1 tablet daily until gone (Patient not taking: Reported on 01/04/2019) 21 tablet 0   No facility-administered medications prior to visit.     ROS See HPI  Objective:  BP (!) 122/96   Pulse 85   Temp 97.9 F (36.6 C) (Tympanic)   Ht 5\' 9"  (1.753 m)   Wt 227 lb 12.8 oz (103.3 kg)   SpO2 95%   BMI 33.64 kg/m   BP Readings from Last 3 Encounters:  01/04/19 (!) 122/96  02/27/18 (!) 138/98  10/11/17 (!) 118/94    Wt Readings from Last 3 Encounters:  01/04/19 227 lb 12.8 oz (103.3 kg)  08/01/18 222 lb  (100.7 kg)  02/27/18 236 lb (107 kg)    Physical Exam Vitals signs reviewed.  HENT:     Right Ear: Tympanic membrane, ear canal and external ear normal. There is no impacted cerumen. No mastoid tenderness.     Left Ear: Tympanic membrane, ear canal and external ear normal. There is no impacted cerumen. No mastoid tenderness.     Ears:     Comments: Localizes discomfort behind left ear Eyes:     Extraocular Movements: Extraocular movements intact.     Conjunctiva/sclera: Conjunctivae normal.  Neck:     Musculoskeletal: Normal range of motion and neck supple.     Thyroid: No thyroid mass, thyromegaly or thyroid tenderness.  Cardiovascular:     Rate and Rhythm: Normal rate.     Pulses: Normal pulses.  Pulmonary:     Effort: Pulmonary effort is normal.  Lymphadenopathy:     Cervical: No cervical adenopathy.     Right cervical: No superficial, deep or posterior cervical adenopathy.    Left cervical: No superficial, deep or posterior cervical adenopathy.  Neurological:     Mental Status: She is alert and oriented to person, place, and time.  Psychiatric:        Mood and Affect: Mood normal.        Behavior: Behavior normal.        Thought Content: Thought content normal.    Lab Results  Component Value Date   GLUCOSE 97 02/27/2018   CHOL 275 (H) 03/03/2017   TRIG 258.0 (H) 03/03/2017   HDL 49.80 03/03/2017   LDLDIRECT 169.0 03/03/2017   ALT 30 02/27/2018   AST 20 02/27/2018   NA 137 02/27/2018   K 4.0 02/27/2018   CL 100 02/27/2018   CREATININE 0.62 02/27/2018   BUN 9 02/27/2018   CO2 29 02/27/2018   TSH 2.07 07/27/2016   HGBA1C 5.7 07/27/2016    Mr Knee Right Wo Contrast  Result Date: 03/15/2017 CLINICAL DATA:  Chronic right knee pain since a fall in July 2018. EXAM: MRI OF THE RIGHT KNEE WITHOUT CONTRAST TECHNIQUE: Multiplanar, multisequence MR imaging of the knee was performed. No intravenous contrast was administered. COMPARISON:  Radiographs dated 12/14/2016  FINDINGS: MENISCI Medial meniscus: There is a complete radial tear of the root of the posterior horn and 10 trick degeneration of the posterior horn the midbody within the far peripheral undersurface longitudinal tear best seen on series 8. Lateral meniscus: Minimal blunting of the free edge of the midbody. Otherwise normal. LIGAMENTS Cruciates:  Intact ACL and PCL. Collaterals: Medial collateral ligament is intact. Lateral collateral ligament complex is intact. CARTILAGE Patellofemoral: Small focal areas of full-thickness cartilage loss of the medial and lateral facets of the patella. Medial: Small areas of partial-thickness cartilage loss on the medial femoral condyle. Lateral: Small  focal area of partial-thickness cartilage loss of the posterior aspect of the lateral tibial plateau. Joint:  Moderate joint effusion. Popliteal Fossa: 15 x 15 x 55 mm Baker's cyst. Intact popliteus tendon. Multiple small ganglion cysts posterior to the posterior cruciate ligament. Extensor Mechanism:  Normal. Bones: Small focal area of subcortical edema at the posterior peripheral aspect of the medial femoral condyle probably secondary to overlying cartilage loss. Other: None IMPRESSION: 1. Complete radial tear of the root of the posterior horn of the medial meniscus with secondary peripheral subluxation of the meniscus. 2. Small peripheral undersurface tear of the posterior horn and midbody of the medial meniscus. 3. Tricompartmental cartilage defects as described. 4. Moderate effusion with small to moderate Baker's cyst. Electronically Signed   By: Lorriane Shire M.D.   On: 03/15/2017 09:34    Assessment & Plan:   Nishika was seen today for ear fullness.  Diagnoses and all orders for this visit:  Sensation of fullness in left ear -     predniSONE (STERAPRED UNI-PAK 21 TAB) 10 MG (21) TBPK tablet; As directed on package  Viral conjunctivitis of right eye -     olopatadine (PATADAY) 0.1 % ophthalmic solution; Place 1 drop  into the right eye 2 (two) times daily.   I have discontinued Kourtnee Spink's predniSONE. I am also having her start on olopatadine and predniSONE. Additionally, I am having her maintain her Biotin, Black Cohosh, Multiple Vitamins-Minerals (MULTIVITAMIN ADULT PO), diazepam, omega-3 acid ethyl esters, OVER THE COUNTER MEDICATION, CINNAMON PO, VITAMIN E PO, cetirizine, OVER THE COUNTER MEDICATION, budesonide-formoterol, fluticasone, albuterol, beclomethasone, mometasone-formoterol, citalopram, and Omega-3 Fatty Acids (OMEGA-3 FISH OIL PO).  Meds ordered this encounter  Medications  . olopatadine (PATADAY) 0.1 % ophthalmic solution    Sig: Place 1 drop into the right eye 2 (two) times daily.    Dispense:  5 mL    Refill:  0    Order Specific Question:   Supervising Provider    Answer:   MATTHEWS, CODY [[id number]]  . predniSONE (STERAPRED UNI-PAK 21 TAB) 10 MG (21) TBPK tablet    Sig: As directed on package    Dispense:  21 tablet    Refill:  0    Order Specific Question:   Supervising Provider    Answer:   MATTHEWS, CODY [[id number]]    Problem List Items Addressed This Visit    None    Visit Diagnoses    Sensation of fullness in left ear    -  Primary   Relevant Medications   predniSONE (STERAPRED UNI-PAK 21 TAB) 10 MG (21) TBPK tablet   Viral conjunctivitis of right eye       Relevant Medications   olopatadine (PATADAY) 0.1 % ophthalmic solution       Follow-up: Return if symptoms worsen or fail to improve.  Wilfred Lacy, NP

## 2019-01-06 ENCOUNTER — Encounter: Payer: Self-pay | Admitting: Nurse Practitioner

## 2019-01-11 ENCOUNTER — Telehealth: Payer: Self-pay | Admitting: Nurse Practitioner

## 2019-01-11 NOTE — Telephone Encounter (Signed)
Patient is calling because she completed the predniSONE (STERAPRED UNI-PAK 21 TAB) 10 MG (21) TBPK tablet [[id number]]    She went to have her hearing checked today. And they would not complete the test. Because the Left ear was so inflamed.  Patient is wanting advise on what she should do?  Please advise [phone]

## 2019-01-11 NOTE — Telephone Encounter (Signed)
Did she go to audiology or ENT? When I saw her there was no inflammation. She can either make another appt with me to re eval her ear or I can enter urgent referral to ENT?

## 2019-01-11 NOTE — Telephone Encounter (Signed)
Questions for Screening COVID-19  Symptom onset: n/a  Travel or Contacts: no  During this illness, did/does the patient experience any of the following symptoms? Fever >100.72F []   Yes [x]   No []   Unknown Subjective fever (felt feverish) []   Yes [x]   No []   Unknown Chills []   Yes [x]   No []   Unknown Muscle aches (myalgia) []   Yes [x]   No []   Unknown Runny nose (rhinorrhea) []   Yes [x]   No []   Unknown Sore throat []   Yes [x]   No []   Unknown Cough (new onset or worsening of chronic cough) []   Yes [x]   No []   Unknown Shortness of breath (dyspnea) []   Yes [x]   No []   Unknown Nausea or vomiting []   Yes []   No []   Unknown Headache []   Yes [x]   No []   Unknown Abdominal pain  []   Yes [x]   No []   Unknown Diarrhea (?3 loose/looser than normal stools/24hr period) []   Yes [x]   No []   Unknown Other, specify:

## 2019-01-11 NOTE — Telephone Encounter (Signed)
Pt went to Audiology ([facility]). Left ear inflammation.   Appt wit Nche set.

## 2019-01-11 NOTE — Telephone Encounter (Signed)
Please advise 

## 2019-01-14 ENCOUNTER — Ambulatory Visit: Payer: Self-pay | Admitting: Nurse Practitioner

## 2019-01-14 ENCOUNTER — Other Ambulatory Visit: Payer: Self-pay

## 2019-01-14 ENCOUNTER — Encounter: Payer: Self-pay | Admitting: Nurse Practitioner

## 2019-01-14 VITALS — BP 120/80 | HR 93 | Temp 98.6°F | Ht 69.0 in

## 2019-01-14 DIAGNOSIS — H9313 Tinnitus, bilateral: Secondary | ICD-10-CM

## 2019-01-14 DIAGNOSIS — H938X2 Other specified disorders of left ear: Secondary | ICD-10-CM

## 2019-01-14 DIAGNOSIS — R635 Abnormal weight gain: Secondary | ICD-10-CM

## 2019-01-14 DIAGNOSIS — J329 Chronic sinusitis, unspecified: Secondary | ICD-10-CM

## 2019-01-14 LAB — CBC WITH DIFFERENTIAL/PLATELET
Basophils Absolute: 0 10*3/uL (ref 0.0–0.1)
Basophils Relative: 0.6 % (ref 0.0–3.0)
Eosinophils Absolute: 0.1 10*3/uL (ref 0.0–0.7)
Eosinophils Relative: 2.6 % (ref 0.0–5.0)
HCT: 42.3 % (ref 36.0–46.0)
Hemoglobin: 14.8 g/dL (ref 12.0–15.0)
Lymphocytes Relative: 34.3 % (ref 12.0–46.0)
Lymphs Abs: 1.9 10*3/uL (ref 0.7–4.0)
MCHC: 35 g/dL (ref 30.0–36.0)
MCV: 93.3 fl (ref 78.0–100.0)
Monocytes Absolute: 0.4 10*3/uL (ref 0.1–1.0)
Monocytes Relative: 6.9 % (ref 3.0–12.0)
Neutro Abs: 3.2 10*3/uL (ref 1.4–7.7)
Neutrophils Relative %: 55.6 % (ref 43.0–77.0)
Platelets: 355 10*3/uL (ref 150.0–400.0)
RBC: 4.53 Mil/uL (ref 3.87–5.11)
RDW: 13.1 % (ref 11.5–15.5)
WBC: 5.7 10*3/uL (ref 4.0–10.5)

## 2019-01-14 LAB — SEDIMENTATION RATE: Sed Rate: 21 mm/hr (ref 0–30)

## 2019-01-14 LAB — TSH: TSH: 1.37 u[IU]/mL (ref 0.35–4.50)

## 2019-01-14 NOTE — Patient Instructions (Addendum)
No left ear redness or drainage or swollen lymph node or tenderness on mastoid bone.  Go to lab for blood draw.  You will be contacted to schedule appt with ENT.

## 2019-01-14 NOTE — Progress Notes (Signed)
Subjective:  Patient ID: Faron Whitelock, female    DOB: March 14, 1961  Age: 58 y.o. MRN: [id number]  CC: Ear Pain (left ear painful and right ear feel full/tyroid check consult?)  HPI Ms. Blase returned with concerns about left ear fullness, no improvement with oral prednisone. She was evaluated by audiology who informed her of left ear irritation. She is also concern about possible thyroid dysfunction due to weight gain and FHx of thyroid dysfunction (mother and aunt).  Reviewed past Medical, Social and Family history today.  Outpatient Medications Prior to Visit  Medication Sig Dispense Refill  . albuterol (PROVENTIL HFA;VENTOLIN HFA) 108 (90 Base) MCG/ACT inhaler Inhale 1-2 puffs into the lungs every 6 (six) hours as needed. 1 Inhaler 3  . beclomethasone (QVAR) 80 MCG/ACT inhaler Inhale 2 puffs into the lungs 2 (two) times daily. 10.6 g 5  . Biotin 10000 MCG TABS Take 10,000 mg by mouth 2 (two) times daily.    . Black Cohosh 80 MG CAPS Take 80 mg by mouth 1 day or 1 dose.    . budesonide-formoterol (SYMBICORT) 160-4.5 MCG/ACT inhaler Inhale 1 puff into the lungs 2 (two) times daily. Rinse month after each use 1 Inhaler 1  . cetirizine (ZYRTEC) 10 MG tablet Take 1 tablet (10 mg total) by mouth at bedtime. 30 tablet 0  . CINNAMON PO Take by mouth.    . citalopram (CELEXA) 20 MG tablet Take 1 tablet (20 mg total) by mouth daily. No further refills without office visit 90 tablet 1  . diazepam (VALIUM) 5 MG tablet take 1 tablet by mouth three times a day as directed  0  . Multiple Vitamins-Minerals (MULTIVITAMIN ADULT PO) Take by mouth.    Marland Kitchen olopatadine (PATADAY) 0.1 % ophthalmic solution Place 1 drop into the right eye 2 (two) times daily. 5 mL 0  . omega-3 acid ethyl esters (LOVAZA) 1 g capsule Take 2 capsules (2 g total) by mouth daily. 180 capsule 1  . OVER THE COUNTER MEDICATION Vinegar Tab    . OVER THE COUNTER MEDICATION Turmeric tablet    . VITAMIN E PO Take by mouth.    . fluticasone  (FLONASE) 50 MCG/ACT nasal spray Place 2 sprays into both nostrils daily. (Patient not taking: Reported on 01/04/2019) 16 g 0  . mometasone-formoterol (DULERA) 100-5 MCG/ACT AERO Inhale 2 puffs into the lungs 2 (two) times daily at 10 AM and 5 PM. (Patient not taking: Reported on 01/04/2019) 8.8 g 11  . Omega-3 Fatty Acids (OMEGA-3 FISH OIL PO) Take by mouth.    . predniSONE (STERAPRED UNI-PAK 21 TAB) 10 MG (21) TBPK tablet As directed on package (Patient not taking: Reported on 01/14/2019) 21 tablet 0   No facility-administered medications prior to visit.     ROS Review of Systems  Constitutional: Negative.   HENT: Positive for congestion, ear discharge, ear pain, sinus pain and tinnitus. Negative for hearing loss, nosebleeds and sore throat.   Eyes: Negative.   Respiratory: Negative.  Negative for stridor.   Cardiovascular: Negative.   Gastrointestinal: Negative.   Musculoskeletal: Negative.   Skin: Negative.   Neurological: Negative.   Psychiatric/Behavioral: Negative.     Objective:  BP 120/80   Pulse 93   Temp 98.6 F (37 C) (Tympanic)   Ht 5\' 9"  (1.753 m)   SpO2 95%   BMI 33.64 kg/m   BP Readings from Last 3 Encounters:  01/14/19 120/80  01/04/19 (!) 122/96  02/27/18 (!) 138/98    Wt  Readings from Last 3 Encounters:  01/04/19 227 lb 12.8 oz (103.3 kg)  08/01/18 222 lb (100.7 kg)  02/27/18 236 lb (107 kg)    Physical Exam HENT:     Head: Normocephalic.     Jaw: No trismus, tenderness, swelling or pain on movement.     Salivary Glands: Right salivary gland is not diffusely enlarged.     Comments: No mastoid bone tenderness    Right Ear: Tympanic membrane, ear canal and external ear normal. There is no impacted cerumen.     Left Ear: Tympanic membrane, ear canal and external ear normal. There is no impacted cerumen.     Nose: Septal deviation present. No mucosal edema.     Right Turbinates: Swollen.     Left Turbinates: Swollen.     Right Sinus: Maxillary sinus  tenderness present.     Left Sinus: Maxillary sinus tenderness present.  Neck:     Musculoskeletal: Normal range of motion and neck supple.     Thyroid: No thyroid mass, thyromegaly or thyroid tenderness.  Cardiovascular:     Rate and Rhythm: Normal rate and regular rhythm.     Pulses: Normal pulses.     Heart sounds: Normal heart sounds.  Lymphadenopathy:     Cervical: No cervical adenopathy.     Lab Results  Component Value Date   GLUCOSE 97 02/27/2018   CHOL 275 (H) 03/03/2017   TRIG 258.0 (H) 03/03/2017   HDL 49.80 03/03/2017   LDLDIRECT 169.0 03/03/2017   ALT 30 02/27/2018   AST 20 02/27/2018   NA 137 02/27/2018   K 4.0 02/27/2018   CL 100 02/27/2018   CREATININE 0.62 02/27/2018   BUN 9 02/27/2018   CO2 29 02/27/2018   TSH 2.07 07/27/2016   HGBA1C 5.7 07/27/2016    Mr Knee Right Wo Contrast  Result Date: 03/15/2017 CLINICAL DATA:  Chronic right knee pain since a fall in July 2018. EXAM: MRI OF THE RIGHT KNEE WITHOUT CONTRAST TECHNIQUE: Multiplanar, multisequence MR imaging of the knee was performed. No intravenous contrast was administered. COMPARISON:  Radiographs dated 12/14/2016 FINDINGS: MENISCI Medial meniscus: There is a complete radial tear of the root of the posterior horn and 10 trick degeneration of the posterior horn the midbody within the far peripheral undersurface longitudinal tear best seen on series 8. Lateral meniscus: Minimal blunting of the free edge of the midbody. Otherwise normal. LIGAMENTS Cruciates:  Intact ACL and PCL. Collaterals: Medial collateral ligament is intact. Lateral collateral ligament complex is intact. CARTILAGE Patellofemoral: Small focal areas of full-thickness cartilage loss of the medial and lateral facets of the patella. Medial: Small areas of partial-thickness cartilage loss on the medial femoral condyle. Lateral: Small focal area of partial-thickness cartilage loss of the posterior aspect of the lateral tibial plateau. Joint:   Moderate joint effusion. Popliteal Fossa: 15 x 15 x 55 mm Baker's cyst. Intact popliteus tendon. Multiple small ganglion cysts posterior to the posterior cruciate ligament. Extensor Mechanism:  Normal. Bones: Small focal area of subcortical edema at the posterior peripheral aspect of the medial femoral condyle probably secondary to overlying cartilage loss. Other: None IMPRESSION: 1. Complete radial tear of the root of the posterior horn of the medial meniscus with secondary peripheral subluxation of the meniscus. 2. Small peripheral undersurface tear of the posterior horn and midbody of the medial meniscus. 3. Tricompartmental cartilage defects as described. 4. Moderate effusion with small to moderate Baker's cyst. Electronically Signed   By: Francene Boyers M.D.  On: 03/15/2017 09:34    Assessment & Plan:   Lavonna RuaSheri was seen today for ear pain.  Diagnoses and all orders for this visit:  Sensation of fullness in left ear -     Sedimentation rate -     C-reactive protein -     Ambulatory referral to ENT  Chronic recurrent sinusitis -     Ambulatory referral to ENT  Tinnitus of both ears -     Ambulatory referral to ENT  Unintended weight gain -     TSH -     Basic metabolic panel -     CBC with Differential/Platelet   I am having Carney CornersSheri Weich maintain her Biotin, Black Cohosh, Multiple Vitamins-Minerals (MULTIVITAMIN ADULT PO), diazepam, omega-3 acid ethyl esters, OVER THE COUNTER MEDICATION, CINNAMON PO, VITAMIN E PO, cetirizine, OVER THE COUNTER MEDICATION, budesonide-formoterol, fluticasone, albuterol, beclomethasone, mometasone-formoterol, citalopram, Omega-3 Fatty Acids (OMEGA-3 FISH OIL PO), olopatadine, and predniSONE.  No orders of the defined types were placed in this encounter.   Problem List Items Addressed This Visit      Respiratory   Chronic recurrent sinusitis   Relevant Orders   Ambulatory referral to ENT    Other Visit Diagnoses    Sensation of fullness in left  ear    -  Primary   Relevant Orders   Sedimentation rate   C-reactive protein   Ambulatory referral to ENT   Tinnitus of both ears       Relevant Orders   Ambulatory referral to ENT   Unintended weight gain       Relevant Orders   TSH   Basic metabolic panel   CBC with Differential/Platelet      Follow-up: Return if symptoms worsen or fail to improve.  Alysia Pennaharlotte Ahleah Simko, NP

## 2019-01-15 ENCOUNTER — Encounter: Payer: Self-pay | Admitting: Nurse Practitioner

## 2019-01-15 LAB — BASIC METABOLIC PANEL
BUN: 14 mg/dL (ref 7–25)
CO2: 25 mmol/L (ref 20–32)
Calcium: 9.3 mg/dL (ref 8.6–10.4)
Chloride: 101 mmol/L (ref 98–110)
Creat: 0.7 mg/dL (ref 0.50–1.05)
Glucose, Bld: 107 mg/dL — ABNORMAL HIGH (ref 65–99)
Potassium: 4 mmol/L (ref 3.5–5.3)
Sodium: 139 mmol/L (ref 135–146)

## 2019-01-15 LAB — C-REACTIVE PROTEIN: CRP: 9.4 mg/L — ABNORMAL HIGH (ref <8.0)

## 2019-05-30 ENCOUNTER — Ambulatory Visit (INDEPENDENT_AMBULATORY_CARE_PROVIDER_SITE_OTHER): Payer: Self-pay | Admitting: Nurse Practitioner

## 2019-05-30 ENCOUNTER — Other Ambulatory Visit: Payer: Self-pay

## 2019-05-30 ENCOUNTER — Encounter: Payer: Self-pay | Admitting: Nurse Practitioner

## 2019-05-30 VITALS — Ht 69.0 in

## 2019-05-30 DIAGNOSIS — J301 Allergic rhinitis due to pollen: Secondary | ICD-10-CM

## 2019-05-30 DIAGNOSIS — H9202 Otalgia, left ear: Secondary | ICD-10-CM

## 2019-05-30 DIAGNOSIS — J329 Chronic sinusitis, unspecified: Secondary | ICD-10-CM

## 2019-05-30 MED ORDER — DOXYCYCLINE HYCLATE 100 MG PO TABS: 100.0000 mg | ORAL_TABLET | Freq: Two times a day (BID) | ORAL | 0 refills | Status: AC

## 2019-05-30 MED ORDER — PREDNISONE 20 MG PO TABS: 40.0000 mg | ORAL_TABLET | Freq: Every day | ORAL | 0 refills | Status: DC

## 2019-05-30 MED ORDER — MONTELUKAST SODIUM 10 MG PO TABS: 10.0000 mg | ORAL_TABLET | Freq: Every day | ORAL | 3 refills | Status: DC

## 2019-05-30 NOTE — Progress Notes (Signed)
Virtual Visit via Video Note  I connected with@ on 05/30/19 at  1:00 PM EST by a video enabled telemedicine application and verified that I am speaking with the correct person using two identifiers.  Location: Patient:Home Provider: Office Participants: patient and provider   I connected with "Patient Name" today by a video enabled telemedicine application and verified that I am speaking with the correct person using two identifiers. Location patient: home Location provider: work Persons participating in the virtual visit: patient, provider  I discussed the limitations of evaluation and management by telemedicine and the availability of in person appointments. I also discussed with the patient that there may be a patient responsible charge related to this service. The patient expressed understanding and agreed to proceed.  [id number] sinusitis and left ear pain.  History of Present Illness: Unable to afford appt with ENT and rheumatology due to lack of insurance. Sinusitis This is a recurrent problem. The current episode started 1 to 4 weeks ago. The problem has been waxing and waning since onset. There has been no fever. The pain is severe. Associated symptoms include congestion, ear pain, headaches and sinus pressure. Pertinent negatives include no chills, coughing, diaphoresis, hoarse voice, neck pain, shortness of breath, sneezing, sore throat or swollen glands. Past treatments include oral decongestants and saline sprays. The treatment provided no relief.  oral prednisone last used 01/2019. Oral abx last taken 08/2018  Observations/Objective: Physical Exam  Constitutional: She is oriented to person, place, and time.  HENT:  Right Ear: External ear normal. No mastoid tenderness.  Left Ear: External ear normal. No mastoid tenderness.  Nose: Right sinus exhibits no maxillary sinus tenderness and no frontal sinus tenderness. Left sinus exhibits maxillary sinus tenderness and  frontal sinus tenderness.  Mouth/Throat: Uvula is midline. No oral lesions. No trismus in the jaw. No dental abscesses or dental caries.  Eyes: Conjunctivae and EOM are normal.  Musculoskeletal:     Cervical back: Normal range of motion and neck supple.  Neurological: She is alert and oriented to person, place, and time.   Assessment and Plan: [name] was seen today for cough.  Diagnoses and all orders for this visit:  Chronic recurrent sinusitis -     montelukast (SINGULAIR) 10 MG tablet; Take 1 tablet (10 mg total) by mouth at bedtime. -     doxycycline (VIBRA-TABS) 100 MG tablet; Take 1 tablet (100 mg total) by mouth 2 (two) times daily for 7 days. -     predniSONE (DELTASONE) 20 MG tablet; Take 2 tablets (40 mg total) by mouth daily with breakfast.  Seasonal allergic rhinitis due to pollen -     montelukast (SINGULAIR) 10 MG tablet; Take 1 tablet (10 mg total) by mouth at bedtime.  Acute ear pain, left -     doxycycline (VIBRA-TABS) 100 MG tablet; Take 1 tablet (100 mg total) by mouth 2 (two) times daily for 7 days. -     predniSONE (DELTASONE) 20 MG tablet; Take 2 tablets (40 mg total) by mouth daily with breakfast.   Follow Up Instructions: See avs   I discussed the assessment and treatment plan with the patient. The patient was provided an opportunity to ask questions and all were answered. The patient agreed with the plan and demonstrated an understanding of the instructions.   The patient was advised to call back or seek an in-person evaluation if the symptoms worsen or if the condition fails to improve as anticipated.  [name], NP

## 2019-05-30 NOTE — Patient Instructions (Addendum)
Schedule appt with ENT. Stop use of oral decongestants. Ok to also use mucinex 600mg  daily, saline sinus rinse once a day, and OTC antihistamine (zyrtec/xyzal/claritin/allegra) 1tab daily.

## 2019-06-03 ENCOUNTER — Encounter: Payer: Self-pay | Admitting: Nurse Practitioner

## 2019-06-24 ENCOUNTER — Other Ambulatory Visit: Payer: Self-pay

## 2019-06-25 ENCOUNTER — Ambulatory Visit (INDEPENDENT_AMBULATORY_CARE_PROVIDER_SITE_OTHER): Payer: Self-pay

## 2019-06-25 ENCOUNTER — Encounter: Payer: Self-pay | Admitting: Nurse Practitioner

## 2019-06-25 ENCOUNTER — Ambulatory Visit (INDEPENDENT_AMBULATORY_CARE_PROVIDER_SITE_OTHER): Payer: Self-pay | Admitting: Nurse Practitioner

## 2019-06-25 VITALS — BP 138/90 | HR 108 | Temp 96.8°F | Ht 69.0 in | Wt 233.4 lb

## 2019-06-25 DIAGNOSIS — W19XXXA Unspecified fall, initial encounter: Secondary | ICD-10-CM

## 2019-06-25 DIAGNOSIS — R0789 Other chest pain: Secondary | ICD-10-CM

## 2019-06-25 NOTE — Patient Instructions (Addendum)
No rib fracture. Continue use of NSAID and cold compress as needed for pain management.   Contusion A contusion is a deep bruise. This is a result of an injury that causes bleeding under the skin. Symptoms of bruising include pain, swelling, and discolored skin. The skin may turn blue, purple, or yellow. Follow these instructions at home: Managing pain, stiffness, and swelling You may use RICE. This stands for:  Resting.  Icing.  Compression, or putting pressure.  Elevating, or raising the injured area. To follow this method, do these actions:  Rest the injured area.  If told, put ice on the injured area. ? Put ice in a plastic bag. ? Place a towel between your skin and the bag. ? Leave the ice on for 20 minutes, 2-3 times per day.  If told, put light pressure (compression) on the injured area using an elastic bandage. Make sure the bandage is not too tight. If the area tingles or becomes numb, remove it and put it back on as told by your doctor.  If possible, raise (elevate) the injured area above the level of your heart while you are sitting or lying down.  General instructions  Take over-the-counter and prescription medicines only as told by your doctor.  Keep all follow-up visits as told by your doctor. This is important. Contact a doctor if:  Your symptoms do not get better after several days of treatment.  Your symptoms get worse.  You have trouble moving the injured area. Get help right away if:  You have very bad pain.  You have a loss of feeling (numbness) in a hand or foot.  Your hand or foot turns pale or cold. Summary  A contusion is a deep bruise. This is a result of an injury that causes bleeding under the skin.  Symptoms of bruising include pain, swelling, and discolored skin. The skin may turn blue, purple, or yellow.  This condition is treated with rest, ice, compression, and elevation. This is also called RICE. You may be given over-the-counter  medicines for pain.  Contact a doctor if you do not feel better, or you feel worse. Get help right away if you have very bad pain, have lost feeling in a hand or foot, or the area turns pale or cold. This information is not intended to replace advice given to you by your health care provider. Make sure you discuss any questions you have with your health care provider. Document Revised: 12/08/2017 Document Reviewed: 12/08/2017 Elsevier Patient Education  2020 [employer].

## 2019-06-25 NOTE — Progress Notes (Signed)
Subjective:  Patient ID: Christina Huerta, female    DOB: February 11, 1961  Age: 59 y.o. MRN: [id number]  CC: Fall (pt fell at [facility] Wednesday, left side was affected, pt went to hospital but nothing has helped and constipates her, ice pack helps most, hurts constant worse when sitting, hospital xray on shoulder left knee an hip)  HPI  Christina Huerta presents with left chest wall pain. Christina Huerta Slipped on wet floor on 06/19/2019. Impact on left shoulder, hip and chest. Denies any head injury, no LOC, no bruising on chest wall. No fever, no SOB. Christina Huerta was evaluated at [facility] ED after incident. DG left hip, shoulder, and pelvis completed (no acute finding). Reports improved shoulder and hip pain. Naproxen was prescribed. Today Christina Huerta reports Persistent and worsening left chest wall pain with palpation and movement.   Reviewed past Medical, Social and Family history today.  Outpatient Medications Prior to Visit  Medication Sig Dispense Refill  . albuterol (PROVENTIL HFA;VENTOLIN HFA) 108 (90 Base) MCG/ACT inhaler Inhale 1-2 puffs into the lungs every 6 (six) hours as needed. 1 Inhaler 3  . Biotin 92426 MCG TABS Take 10,000 mg by mouth 2 (two) times daily.    . Black Cohosh 80 MG CAPS Take 80 mg by mouth 1 day or 1 dose.    . cetirizine (ZYRTEC) 10 MG tablet Take 1 tablet (10 mg total) by mouth at bedtime. 30 tablet 0  . CINNAMON PO Take by mouth.    . citalopram (CELEXA) 20 MG tablet Take 1 tablet (20 mg total) by mouth daily. No further refills without office visit 90 tablet 1  . fluticasone (FLONASE) 50 MCG/ACT nasal spray Place 2 sprays into both nostrils daily. 16 g 0  . montelukast (SINGULAIR) 10 MG tablet Take 1 tablet (10 mg total) by mouth at bedtime. 90 tablet 3  . Multiple Vitamins-Minerals (MULTIVITAMIN ADULT PO) Take by mouth.    . Omega-3 Fatty Acids (OMEGA-3 FISH OIL PO) Take by mouth.    Marland Kitchen OVER THE COUNTER MEDICATION Vinegar Tab    . OVER THE COUNTER MEDICATION Turmeric tablet    . VITAMIN  E PO Take by mouth.    Marland Kitchen BLACK COHOSH PO Take by mouth.    . mometasone-formoterol (DULERA) 100-5 MCG/ACT AERO Inhale 2 puffs into the lungs 2 (two) times daily at 10 AM and 5 PM. (Patient not taking: Reported on 06/25/2019) 8.8 g 11  . predniSONE (DELTASONE) 20 MG tablet Take 2 tablets (40 mg total) by mouth daily with breakfast. (Patient not taking: Reported on 06/25/2019) 6 tablet 0   No facility-administered medications prior to visit.   ROS Review of Systems  Respiratory: Negative.   Cardiovascular: Positive for chest pain. Negative for palpitations.  Musculoskeletal: Positive for falls.  Neurological: Negative for sensory change, focal weakness, loss of consciousness and weakness.   Objective:  BP 138/90   Pulse (!) 108   Temp (!) 96.8 F (36 C)   Ht 5\' 9"  (1.753 m)   Wt 233 lb 6.4 oz (105.9 kg)   SpO2 95%   BMI 34.47 kg/m   BP Readings from Last 3 Encounters:  06/25/19 138/90  01/14/19 120/80  01/04/19 (!) 122/96    Wt Readings from Last 3 Encounters:  06/25/19 233 lb 6.4 oz (105.9 kg)  01/04/19 227 lb 12.8 oz (103.3 kg)  08/01/18 222 lb (100.7 kg)    Physical Exam Pulmonary:     Effort: Pulmonary effort is normal.     Breath  sounds: Normal breath sounds.  Chest:     Chest wall: Tenderness present. No lacerations, deformity, swelling or crepitus.  Musculoskeletal:     Cervical back: Normal range of motion and neck supple.  Neurological:     Mental Status: Christina Huerta is alert and oriented to person, place, and time.    Lab Results  Component Value Date   WBC 5.7 01/14/2019   HGB 14.8 01/14/2019   HCT 42.3 01/14/2019   PLT 355.0 01/14/2019   GLUCOSE 107 (H) 01/14/2019   CHOL 275 (H) 03/03/2017   TRIG 258.0 (H) 03/03/2017   HDL 49.80 03/03/2017   LDLDIRECT 169.0 03/03/2017   ALT 30 02/27/2018   AST 20 02/27/2018   NA 139 01/14/2019   K 4.0 01/14/2019   CL 101 01/14/2019   CREATININE 0.70 01/14/2019   BUN 14 01/14/2019   CO2 25 01/14/2019   TSH 1.37  01/14/2019   HGBA1C 5.7 07/27/2016   Assessment & Plan:  This visit occurred during the SARS-CoV-2 public health emergency.  Safety protocols were in place, including screening questions prior to the visit, additional usage of staff PPE, and extensive cleaning of exam room while observing appropriate contact time as indicated for disinfecting solutions.   Christina Huerta was seen today for fall.  Diagnoses and all orders for this visit:  Fall, initial encounter -     DG Ribs Unilateral Left  Left-sided chest wall pain -     DG Ribs Unilateral Left   I am having Christina Huerta maintain her Biotin, Black Cohosh, Multiple Vitamins-Minerals (MULTIVITAMIN ADULT PO), OVER THE COUNTER MEDICATION, CINNAMON PO, VITAMIN E PO, cetirizine, OVER THE COUNTER MEDICATION, fluticasone, albuterol, mometasone-formoterol, citalopram, Omega-3 Fatty Acids (OMEGA-3 FISH OIL PO), montelukast, predniSONE, and BLACK COHOSH PO.  No orders of the defined types were placed in this encounter.   Problem List Items Addressed This Visit    None    Visit Diagnoses    Fall, initial encounter    -  Primary   Relevant Orders   DG Ribs Unilateral Left   Left-sided chest wall pain       Relevant Orders   DG Ribs Unilateral Left       Follow-up: No follow-ups on file.  Wilfred Lacy, NP

## 2019-06-26 NOTE — Telephone Encounter (Signed)
-----   Message from Rene Paci, [place] sent at 06/25/2019  3:08 PM EST ----- Pt was contacted of results but wasn't sure what to do about going to work. 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 Please advise.

## 2019-06-27 ENCOUNTER — Encounter: Payer: Self-pay | Admitting: Nurse Practitioner

## 2019-06-29 ENCOUNTER — Encounter: Payer: Self-pay | Admitting: Nurse Practitioner

## 2019-07-02 ENCOUNTER — Encounter: Payer: Self-pay | Admitting: Nurse Practitioner

## 2019-07-02 NOTE — Telephone Encounter (Signed)
Pt is aware and out of work letter written on my chart.

## 2019-07-18 ENCOUNTER — Ambulatory Visit (INDEPENDENT_AMBULATORY_CARE_PROVIDER_SITE_OTHER): Payer: Self-pay

## 2019-07-18 ENCOUNTER — Other Ambulatory Visit: Payer: Self-pay

## 2019-07-18 ENCOUNTER — Ambulatory Visit: Payer: Self-pay | Admitting: Orthopaedic Surgery

## 2019-07-18 ENCOUNTER — Ambulatory Visit: Payer: Self-pay

## 2019-07-18 ENCOUNTER — Encounter: Payer: Self-pay | Admitting: Orthopaedic Surgery

## 2019-07-18 DIAGNOSIS — M25552 Pain in left hip: Secondary | ICD-10-CM

## 2019-07-18 MED ORDER — TRAMADOL HCL 50 MG PO TABS: 50.0000 mg | ORAL_TABLET | Freq: Four times a day (QID) | ORAL | 0 refills | Status: DC | PRN

## 2019-07-18 NOTE — Progress Notes (Signed)
Office Visit Note   Patient: Christina Huerta           Date of Birth: 04/08/1961           MRN: [id number] Visit Date: 07/18/2019              Requested by: Anne Ng, NP [address] [place],  [place] [address] PCP: Anne Ng, NP   Assessment & Plan: Visit Diagnoses:  1. Pain in left hip     Plan: Impression is left hip questionable labral tear.  We will refer the patient to Dr. Prince Rome for an ultrasound-guided cortisone injection to the left hip joint.  She will follow up with [place] as needed.  Follow-Up Instructions: Return if symptoms worsen or fail to improve.   Orders:  Orders Placed This Encounter  Procedures  . XR HIP UNILAT W OR W/O PELVIS 2-3 VIEWS LEFT   No orders of the defined types were placed in this encounter.     Procedures: No procedures performed   Clinical Data: No additional findings.   Subjective: Chief Complaint  Patient presents with  . Left Hip - Pain  . Chest Pain    RIB PAIN ON LEFT SIDE    HPI patient is a pleasant 59 year old female who comes in today following an injury to her left lower extremity.  She was admitted restaurant on 06/19/2019 when she slipped and fell landing on her left side.  She was taken by ambulance to [facility] where x-rays of her left knee and left hip were obtained.  X-rays were negative for fracture.  He comes in today for further evaluation and treatment recommendation.  She notes that her left knee pain is significantly better, but has continued pain to the left groin.  The pain she has here is worse when she is sitting on the left buttock or lifting anything heavy.  She has been taking Advil and Tylenol with mild relief of symptoms.  No numbness, tingling or burning.  No previous injury to her left hip.  Review of Systems as detailed in HPI.  All others reviewed and are negative.   Objective: Vital Signs: There were no vitals taken for this visit.  Physical Exam well-developed  well-nourished female in no acute distress.  Alert and oriented x3.  Ortho Exam examination of her left hip reveals a markedly positive logroll and FADIR.  Negative straight leg raise.  Knee exam shows a trace effusion.  Range of motion 0 to 120 degrees.  No joint line tenderness.  She is neurovascularly intact distally.  Specialty Comments:  No specialty comments available.  Imaging: XR HIP UNILAT W OR W/O PELVIS 2-3 VIEWS LEFT  Result Date: 07/18/2019 No acute or structural abnormalities    PMFS History: Patient Active Problem List   Diagnosis Date Noted  . Chronic recurrent sinusitis 10/11/2017  . Mild intermittent asthma with acute exacerbation 09/22/2017  . Synovitis of right knee 04/21/2017  . 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 Chondromalacia, right knee 04/14/2017  . Meniscal injury, right, sequela 03/28/2017  . Hypertriglyceridemia 03/03/2017  . Right knee pain 12/16/2016  . Fracture of radial neck, right, closed 11/23/2016  . Hyperglycemia 07/26/2016  . Recurrent major depressive disorder (HCC) 07/26/2016  . Hx of rheumatoid arthritis 07/25/2016  . Allergic rhinitis 07/25/2016   Past Medical History:  Diagnosis Date  . Allergy   . Arthritis   . Asthma   . Depression   .  Hypertriglyceridemia 03/03/2017  . IBS (irritable bowel syndrome)   . Kidney stone   . Migraines     Family History  Problem Relation Age of Onset  . Hyperlipidemia Mother   . Hyperlipidemia Father   . Heart disease Father   . Hypertension Father   . Kidney disease Father   . Arthritis Maternal Grandmother   . Hyperlipidemia Maternal Grandmother   . Cancer Maternal Grandfather        prostate cancer  . Hyperlipidemia Maternal Grandfather   . Arthritis Paternal Grandmother   . Hyperlipidemia Paternal Grandmother   . Cancer Paternal Grandfather        colon cancer  . Hyperlipidemia Paternal Grandfather   . Heart disease Paternal Grandfather   . Stroke Paternal  Grandfather   . Hypertension Paternal Grandfather     Past Surgical History:  Procedure Laterality Date  . CESAREAN SECTION  1996  . scar neunoma surgery  1998   Social History   Occupational History  . Not on file  Tobacco Use  . Smoking status: Never Smoker  . Smokeless tobacco: Never Used  Substance and Sexual Activity  . Alcohol use: Yes    Comment: social  . Drug use: No  . Sexual activity: Not on file

## 2019-07-18 NOTE — Progress Notes (Signed)
Subjective: Patient is here for ultrasound-guided intra-articular left hip injection.   She has a possible labrum tear.  Objective: Pain with sitting, and with flexion and internal rotation.  Procedure: Ultrasound-guided left hip injection: After sterile prep with Betadine, injected 8 cc 1% lidocaine without epinephrine and 40 mg methylprednisolone using a 22-gauge spinal needle, passing the needle through the iliofemoral ligament into the femoral head/neck junction.  Injectate was seen filling the joint capsule.  She had a thickened capsule.  She had good relief during the anesthetic phase

## 2019-07-19 ENCOUNTER — Other Ambulatory Visit: Payer: Self-pay | Admitting: Nurse Practitioner

## 2019-07-19 DIAGNOSIS — F339 Major depressive disorder, recurrent, unspecified: Secondary | ICD-10-CM

## 2019-08-29 ENCOUNTER — Encounter: Payer: Self-pay | Admitting: Nurse Practitioner

## 2019-08-29 ENCOUNTER — Other Ambulatory Visit: Payer: Self-pay

## 2019-08-29 ENCOUNTER — Telehealth (INDEPENDENT_AMBULATORY_CARE_PROVIDER_SITE_OTHER): Payer: Self-pay | Admitting: Nurse Practitioner

## 2019-08-29 DIAGNOSIS — J329 Chronic sinusitis, unspecified: Secondary | ICD-10-CM

## 2019-08-29 DIAGNOSIS — G8929 Other chronic pain: Secondary | ICD-10-CM

## 2019-08-29 DIAGNOSIS — H9202 Otalgia, left ear: Secondary | ICD-10-CM

## 2019-08-29 MED ORDER — PREDNISONE 20 MG PO TABS: 40.0000 mg | ORAL_TABLET | Freq: Every day | ORAL | 0 refills | Status: DC

## 2019-08-29 NOTE — Assessment & Plan Note (Signed)
Recurrent left maxilla sinus pressure, every 3-23months, frequent oral abx and prednisone use (azithromycin, amoxcillin, and doxycycline). She decline CT sinus due to lack of insurance. She has not schedule appt with ENT as previously discussed. States she had dental evaluation recently and no dental abscess or gum disease noted. Oral amoxicillin 500mg  TID x 10days prescribed 3days ago by another provider.  Complete amoxicillin as prescribed. Start oral prednisone x 5days Ok to use tylenol 500mg  1-2tab every 8hrs prn for pain I will no longer be able to prescribe oral abx and prednisone for this compliant. It is imperative you schedule an appt with ENT for additional evaluation. She verbalized understanding.

## 2019-08-29 NOTE — Progress Notes (Signed)
Virtual Visit via Video Note  I connected with@ on 08/29/19 at 12:30 PM EDT by a video enabled telemedicine application and verified that I am speaking with the correct person using two identifiers.  Location: Patient:Home Provider: Office Participants: patient and provider  I discussed the limitations of evaluation and management by telemedicine and the availability of in person appointments. I also discussed with the patient that there may be a patient responsible charge related to this service. The patient expressed understanding and agreed to proceed.  CC:pt c/o of sinus pressure,swelling under ear lobe and neck area sore--going on 5 days--otc advil and tylenol--went to dentis--got amoxillin--didnt help.   History of Present Illness: Sinusitis This is a chronic problem. The current episode started more than 1 year ago. The problem has been waxing and waning since onset. There has been no fever. Associated symptoms include congestion, sinus pressure and swollen glands. Pertinent negatives include no chills, coughing, diaphoresis, ear pain, headaches, hoarse voice, neck pain, shortness of breath, sneezing or sore throat. Past treatments include antibiotics, saline sprays and oral decongestants. The treatment provided mild relief.   Observations/Objective: Physical Exam  Constitutional: No distress.  HENT:  Nose: Left sinus exhibits maxillary sinus tenderness.  Mouth/Throat: No trismus in the jaw.  Pulmonary/Chest: Effort normal.  Musculoskeletal:     Cervical back: Normal range of motion and neck supple.   Assessment and Plan: Christina Huerta was seen today for sinusitis.  Diagnoses and all orders for this visit:  Chronic recurrent sinusitis -     predniSONE (DELTASONE) 20 MG tablet; Take 2 tablets (40 mg total) by mouth daily with breakfast.  Chronic left ear pain   Follow Up Instructions: Complete amoxicillin as prescribed Start oral prednisone x 5days Ok to use tylenol 500mg   1-2tab every 8hrs prn for pain I will no longer be able to prescribe oral abx and prdnisone for this compliant. It is imperative you schedule an appt with ENT for additional evaluation.   I discussed the assessment and treatment plan with the patient. The patient was provided an opportunity to ask questions and all were answered. The patient agreed with the plan and demonstrated an understanding of the instructions.   The patient was advised to call back or seek an in-person evaluation if the symptoms worsen or if the condition fails to improve as anticipated.   , NP

## 2019-08-29 NOTE — Patient Instructions (Signed)
Complete amoxicillin as prescribed. Start oral prednisone x 5days Ok to use tylenol 500mg  1-2tab every 8hrs prn for pain I will no longer be able to prescribe oral abx and prednisone for this compliant. It is imperative you schedule an appt with ENT for additional evaluation.

## 2019-10-29 ENCOUNTER — Encounter: Payer: Self-pay | Admitting: Orthopaedic Surgery

## 2019-10-29 ENCOUNTER — Ambulatory Visit (INDEPENDENT_AMBULATORY_CARE_PROVIDER_SITE_OTHER): Payer: Self-pay

## 2019-10-29 ENCOUNTER — Ambulatory Visit (INDEPENDENT_AMBULATORY_CARE_PROVIDER_SITE_OTHER): Payer: Self-pay | Admitting: Orthopaedic Surgery

## 2019-10-29 DIAGNOSIS — M25552 Pain in left hip: Secondary | ICD-10-CM

## 2019-10-29 DIAGNOSIS — M25562 Pain in left knee: Secondary | ICD-10-CM

## 2019-10-29 NOTE — Progress Notes (Signed)
Office Visit Note   Patient: Christina Huerta           Date of Birth: 1960-11-18           MRN: [id number] Visit Date: 10/29/2019              Requested by: Anne Ng, NP [address] [place],  [place] [address] PCP: Anne Ng, NP   Assessment & Plan: Visit Diagnoses:  1. Left knee pain, unspecified chronicity     Plan: Impression is continued left hip pain concerning for labral tear.  At this point we will need to obtain an MR arthrogram to evaluate for labral tear.  Follow-up after the MRI.  Follow-Up Instructions: Return if symptoms worsen or fail to improve.   Orders:  Orders Placed This Encounter  Procedures  . XR KNEE 3 VIEW LEFT   No orders of the defined types were placed in this encounter.     Procedures: No procedures performed   Clinical Data: No additional findings.   Subjective: Chief Complaint  Patient presents with  . Left Knee - Pain    Christina Huerta returns today for chronic left knee pain.  On further discussion sounds like it still the same left groin pain that radiates down to the knee.  She had a cortisone injection in February which helped until recently.   Review of Systems   Objective: Vital Signs: There were no vitals taken for this visit.  Physical Exam  Ortho Exam Left knee exam is unremarkable. Left hip exam is consistent with a labral tear. Specialty Comments:  No specialty comments available.  Imaging: XR KNEE 3 VIEW LEFT  Result Date: 10/29/2019 No acute or structural abnormalities    PMFS History: Patient Active Problem List   Diagnosis Date Noted  . Chronic recurrent sinusitis 10/11/2017  . Mild intermittent asthma with acute exacerbation 09/22/2017  . Synovitis of right knee 04/21/2017  . 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 Chondromalacia, right knee 04/14/2017  . Meniscal injury, right, sequela 03/28/2017  . Hypertriglyceridemia 03/03/2017  . Right knee pain  12/16/2016  . Fracture of radial neck, right, closed 11/23/2016  . Hyperglycemia 07/26/2016  . Recurrent major depressive disorder (HCC) 07/26/2016  . Hx of rheumatoid arthritis 07/25/2016  . Allergic rhinitis 07/25/2016   Past Medical History:  Diagnosis Date  . Allergy   . Arthritis   . Asthma   . Depression   . Hypertriglyceridemia 03/03/2017  . IBS (irritable bowel syndrome)   . Kidney stone   . Migraines     Family History  Problem Relation Age of Onset  . Hyperlipidemia Mother   . Hyperlipidemia Father   . Heart disease Father   . Hypertension Father   . Kidney disease Father   . Arthritis Maternal Grandmother   . Hyperlipidemia Maternal Grandmother   . Cancer Maternal Grandfather        prostate cancer  . Hyperlipidemia Maternal Grandfather   . Arthritis Paternal Grandmother   . Hyperlipidemia Paternal Grandmother   . Cancer Paternal Grandfather        colon cancer  . Hyperlipidemia Paternal Grandfather   . Heart disease Paternal Grandfather   . Stroke Paternal Grandfather   . Hypertension Paternal Grandfather     Past Surgical History:  Procedure Laterality Date  . CESAREAN SECTION  1996  . scar neunoma surgery  1998   Social History   Occupational History  . Not on file  Tobacco Use  . Smoking status: Never Smoker  . Smokeless tobacco: Never Used  Vaping Use  . Vaping Use: Never used  Substance and Sexual Activity  . Alcohol use: Yes    Comment: social  . Drug use: No  . Sexual activity: Not on file

## 2019-10-30 ENCOUNTER — Ambulatory Visit: Payer: Self-pay | Admitting: Orthopaedic Surgery

## 2019-11-08 ENCOUNTER — Other Ambulatory Visit: Payer: Self-pay

## 2019-11-08 ENCOUNTER — Ambulatory Visit
Admission: RE | Admit: 2019-11-08 | Discharge: 2019-11-08 | Disposition: A | Discharge status: Home / Self Care | Payer: Self-pay | Source: Ambulatory Visit | Attending: Orthopaedic Surgery | Admitting: Orthopaedic Surgery

## 2019-11-08 DIAGNOSIS — M25552 Pain in left hip: Secondary | ICD-10-CM

## 2019-11-08 MED ORDER — IOPAMIDOL (ISOVUE-M 200) INJECTION 41%
12.0000 mL | Freq: Once | INTRAMUSCULAR | Status: AC
  Administered 2019-11-08: 12 mL via INTRA_ARTICULAR

## 2019-11-12 ENCOUNTER — Ambulatory Visit (INDEPENDENT_AMBULATORY_CARE_PROVIDER_SITE_OTHER): Payer: Self-pay | Admitting: Orthopaedic Surgery

## 2019-11-12 ENCOUNTER — Encounter: Payer: Self-pay | Admitting: Orthopaedic Surgery

## 2019-11-12 ENCOUNTER — Ambulatory Visit (INDEPENDENT_AMBULATORY_CARE_PROVIDER_SITE_OTHER): Payer: Self-pay

## 2019-11-12 VITALS — Ht 69.0 in | Wt 236.0 lb

## 2019-11-12 DIAGNOSIS — M5416 Radiculopathy, lumbar region: Secondary | ICD-10-CM

## 2019-11-12 DIAGNOSIS — M1612 Unilateral primary osteoarthritis, left hip: Secondary | ICD-10-CM

## 2019-11-12 MED ORDER — PREDNISONE 10 MG (21) PO TBPK
ORAL_TABLET | ORAL | 0 refills | Status: DC
Start: 2019-11-12 — End: 2020-01-24

## 2019-11-12 MED ORDER — METHOCARBAMOL 500 MG PO TABS: 500.0000 mg | ORAL_TABLET | Freq: Two times a day (BID) | ORAL | 0 refills | Status: DC | PRN

## 2019-11-12 NOTE — Progress Notes (Signed)
Office Visit Note   Patient: Christina Huerta           Date of Birth: 1960-07-02           MRN: [id number] Visit Date: 11/12/2019              Requested by: Anne Ng, NP [address] [place],  [place] [address] PCP: Anne Ng, NP   Assessment & Plan: Visit Diagnoses:  1. Unilateral primary osteoarthritis, left hip   2. Radiculopathy, lumbar region     Plan: Impression is left hip exacerbation of underlying arthritis and left lower extremity radiculopathy.  I have started the patient on a Sterapred taper, muscle relaxer and formal physical therapy.  She will follow up with [place] in 6 to 8 weeks for recheck.  Call with concerns or questions.  Follow-Up Instructions: Return in about 7 weeks (around 12/31/2019).   Orders:  Orders Placed This Encounter  Procedures  . XR Lumbar Spine 2-3 Views  . Ambulatory referral to Physical Therapy   Meds ordered this encounter  Medications  . methocarbamol (ROBAXIN) 500 MG tablet    Sig: Take 1 tablet (500 mg total) by mouth 2 (two) times daily as needed.    Dispense:  20 tablet    Refill:  0  . predniSONE (STERAPRED UNI-PAK 21 TAB) 10 MG (21) TBPK tablet    Sig: Take as directed    Dispense:  21 tablet    Refill:  0      Procedures: No procedures performed   Clinical Data: No additional findings.   Subjective: Chief Complaint  Patient presents with  . Left Hip - Follow-up    MRI review    HPI patient is a pleasant 59 year old female who comes in today to discuss MRI results of her left hip.  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  She was initially seen in our office where she was having significant pain to the left groin.  Subsequent cortisone injection was performed by Dr. Prince Rome which moderately helped this pain.  She had continued pain and thus MRI of the left hip was ordered.  MRI showed mild to moderate degenerative changes with fraying of the  labrum.  She is now having not only groin pain but pain to the left buttocks rating down the back of the left leg.  This does appear to be worse with straightening her leg as well as with sitting or lying down.  Review of Systems as detailed in HPI.  All others reviewed and are negative.   Objective: Vital Signs: Ht 5\' 9"  (1.753 m)   Wt 236 lb (107 kg)   BMI 34.85 kg/m   Physical Exam well-developed well-nourished female no acute distress.  Alert and oriented x3.  Ortho Exam examination of the left hip reveals a moderately positive logroll and FADIR.  Markedly positive straight leg raise.  No focal weakness.  She is neurovascular intact distally.  Specialty Comments:  No specialty comments available.  Imaging: XR Lumbar Spine 2-3 Views  Result Date: 11/12/2019 Straightening of the lumbar spine    PMFS History: Patient Active Problem List   Diagnosis Date Noted  . Chronic recurrent sinusitis 10/11/2017  . Mild intermittent asthma with acute exacerbation 09/22/2017  . Synovitis of right knee 04/21/2017  . 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 Chondromalacia, right knee 04/14/2017  . Meniscal injury, right, sequela 03/28/2017  . Hypertriglyceridemia  03/03/2017  . Right knee pain 12/16/2016  . Fracture of radial neck, right, closed 11/23/2016  . Hyperglycemia 07/26/2016  . Recurrent major depressive disorder (HCC) 07/26/2016  . Hx of rheumatoid arthritis 07/25/2016  . Allergic rhinitis 07/25/2016   Past Medical History:  Diagnosis Date  . Allergy   . Arthritis   . Asthma   . Depression   . Hypertriglyceridemia 03/03/2017  . IBS (irritable bowel syndrome)   . Kidney stone   . Migraines     Family History  Problem Relation Age of Onset  . Hyperlipidemia Mother   . Hyperlipidemia Father   . Heart disease Father   . Hypertension Father   . Kidney disease Father   . Arthritis Maternal Grandmother   . Hyperlipidemia Maternal Grandmother   .  Cancer Maternal Grandfather        prostate cancer  . Hyperlipidemia Maternal Grandfather   . Arthritis Paternal Grandmother   . Hyperlipidemia Paternal Grandmother   . Cancer Paternal Grandfather        colon cancer  . Hyperlipidemia Paternal Grandfather   . Heart disease Paternal Grandfather   . Stroke Paternal Grandfather   . Hypertension Paternal Grandfather     Past Surgical History:  Procedure Laterality Date  . CESAREAN SECTION  1996  . scar neunoma surgery  1998   Social History   Occupational History  . Not on file  Tobacco Use  . Smoking status: Never Smoker  . Smokeless tobacco: Never Used  Vaping Use  . Vaping Use: Never used  Substance and Sexual Activity  . Alcohol use: Yes    Comment: social  . Drug use: No  . Sexual activity: Not on file

## 2019-11-19 ENCOUNTER — Telehealth: Payer: Self-pay | Admitting: Orthopaedic Surgery

## 2019-11-19 NOTE — Telephone Encounter (Signed)
Spoke to patient. I advised her that we have faxed  everything to [facility] in [place]. I gave patient their number to follow up.

## 2019-11-19 NOTE — Telephone Encounter (Signed)
Pt called checking on her PT referral that was supposed to be put in last week.   [phone]

## 2019-11-21 ENCOUNTER — Telehealth: Payer: Self-pay | Admitting: Orthopaedic Surgery

## 2019-11-21 NOTE — Telephone Encounter (Signed)
Tamia with [facility] called asking we send a rx for a referral sent in.   Fax# [phone] Tamia CB# [phone]

## 2019-11-22 NOTE — Telephone Encounter (Signed)
faxed

## 2019-12-24 ENCOUNTER — Ambulatory Visit (INDEPENDENT_AMBULATORY_CARE_PROVIDER_SITE_OTHER): Payer: Self-pay | Admitting: Orthopaedic Surgery

## 2019-12-24 ENCOUNTER — Encounter: Payer: Self-pay | Admitting: Orthopaedic Surgery

## 2019-12-24 DIAGNOSIS — M1612 Unilateral primary osteoarthritis, left hip: Secondary | ICD-10-CM

## 2019-12-24 DIAGNOSIS — M5416 Radiculopathy, lumbar region: Secondary | ICD-10-CM

## 2019-12-24 NOTE — Progress Notes (Signed)
Office Visit Note   Patient: Christina Huerta           Date of Birth: 04/10/1961           MRN: [id number] Visit Date: 12/24/2019              Requested by: Anne Ng, NP [address] [place],  [place] [address] PCP: Anne Ng, NP   Assessment & Plan: Visit Diagnoses:  1. Unilateral primary osteoarthritis, left hip   2. Radiculopathy, lumbar region     Plan: Impression is improving left hip OA and lumbar radiculopathy.  Based on her options she would like to continue with physical therapy.  She would like to hold off on hip injection or ESI at this time.  Continue same work restrictions for 6 weeks.  Follow-up as needed.  Follow-Up Instructions: Return if symptoms worsen or fail to improve.   Orders:  No orders of the defined types were placed in this encounter.  No orders of the defined types were placed in this encounter.     Procedures: No procedures performed   Clinical Data: No additional findings.   Subjective: Chief Complaint  Patient presents with  . Left Hip - Pain, Follow-up  . Lower Back - Pain, Follow-up    Christina Huerta follows up today for her left hip OA and lumbar radiculopathy.  Overall she is feeling better since she started on the prednisone and Robaxin.  She is no longer walking with a cane.  She still has some numbness on the lateral aspect of the left foot without any motor weakness.  She is has some mild groin pain.  She is doing physical therapy twice a week and she has definitely noticed an improvement from this.  Recently traveled to [place] and the long car ride may have made her symptoms worse.   Review of Systems   Objective: Vital Signs: There were no vitals taken for this visit.  Physical Exam  Ortho Exam Exams are unchanged. Specialty Comments:  No specialty comments available.  Imaging: No results found.   PMFS History: Patient Active Problem List   Diagnosis Date Noted  . Chronic recurrent  sinusitis 10/11/2017  . Mild intermittent asthma with acute exacerbation 09/22/2017  . Synovitis of right knee 04/21/2017  . 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 Chondromalacia, right knee 04/14/2017  . Meniscal injury, right, sequela 03/28/2017  . Hypertriglyceridemia 03/03/2017  . Right knee pain 12/16/2016  . Fracture of radial neck, right, closed 11/23/2016  . Hyperglycemia 07/26/2016  . Recurrent major depressive disorder (HCC) 07/26/2016  . Hx of rheumatoid arthritis 07/25/2016  . Allergic rhinitis 07/25/2016   Past Medical History:  Diagnosis Date  . Allergy   . Arthritis   . Asthma   . Depression   . Hypertriglyceridemia 03/03/2017  . IBS (irritable bowel syndrome)   . Kidney stone   . Migraines     Family History  Problem Relation Age of Onset  . Hyperlipidemia Mother   . Hyperlipidemia Father   . Heart disease Father   . Hypertension Father   . Kidney disease Father   . Arthritis Maternal Grandmother   . Hyperlipidemia Maternal Grandmother   . Cancer Maternal Grandfather        prostate cancer  . Hyperlipidemia Maternal Grandfather   . Arthritis Paternal Grandmother   . Hyperlipidemia Paternal Grandmother   . Cancer Paternal Grandfather        colon cancer  .  Hyperlipidemia Paternal Grandfather   . Heart disease Paternal Grandfather   . Stroke Paternal Grandfather   . Hypertension Paternal Grandfather     Past Surgical History:  Procedure Laterality Date  . CESAREAN SECTION  1996  . scar neunoma surgery  1998   Social History   Occupational History  . Not on file  Tobacco Use  . Smoking status: Never Smoker  . Smokeless tobacco: Never Used  Vaping Use  . Vaping Use: Never used  Substance and Sexual Activity  . Alcohol use: Yes    Comment: social  . Drug use: No  . Sexual activity: Not on file

## 2020-01-18 ENCOUNTER — Other Ambulatory Visit: Payer: Self-pay | Admitting: Nurse Practitioner

## 2020-01-18 DIAGNOSIS — F339 Major depressive disorder, recurrent, unspecified: Secondary | ICD-10-CM

## 2020-01-21 ENCOUNTER — Telehealth: Payer: Self-pay | Admitting: Nurse Practitioner

## 2020-01-21 NOTE — Telephone Encounter (Signed)
Patient called regarding denied refill for citalopram. States she does not have insurance and does not feel she needs an appt for this refill. However, if Christina Huerta can remove skin tags also, she will schedule a visit for both, to make it worth her $$ for the visit. Please call patient to advise. Per patient, please leave detailed voicemail if she can not answer.

## 2020-01-21 NOTE — Telephone Encounter (Signed)
Skin tag removal can only be determined after eval in office. Inorder to continue getting medication refills from me, she needs to have an annual physical once a year or at least an OV to address that medication once a year. Advise to schedule an appt in order to get additional refills.

## 2020-01-22 NOTE — Telephone Encounter (Signed)
 appt scheduled for 01/24/2020

## 2020-01-24 ENCOUNTER — Other Ambulatory Visit: Payer: Self-pay

## 2020-01-24 ENCOUNTER — Encounter: Payer: Self-pay | Admitting: Nurse Practitioner

## 2020-01-24 ENCOUNTER — Ambulatory Visit: Payer: Self-pay | Admitting: Nurse Practitioner

## 2020-01-24 VITALS — BP 124/80 | HR 84 | Temp 97.0°F | Ht 69.0 in | Wt 237.2 lb

## 2020-01-24 DIAGNOSIS — F339 Major depressive disorder, recurrent, unspecified: Secondary | ICD-10-CM

## 2020-01-24 DIAGNOSIS — F3342 Major depressive disorder, recurrent, in full remission: Secondary | ICD-10-CM

## 2020-01-24 DIAGNOSIS — L918 Other hypertrophic disorders of the skin: Secondary | ICD-10-CM

## 2020-01-24 DIAGNOSIS — J301 Allergic rhinitis due to pollen: Secondary | ICD-10-CM

## 2020-01-24 DIAGNOSIS — R739 Hyperglycemia, unspecified: Secondary | ICD-10-CM

## 2020-01-24 DIAGNOSIS — E781 Pure hyperglyceridemia: Secondary | ICD-10-CM

## 2020-01-24 DIAGNOSIS — J329 Chronic sinusitis, unspecified: Secondary | ICD-10-CM

## 2020-01-24 MED ORDER — MONTELUKAST SODIUM 10 MG PO TABS: 10.0000 mg | ORAL_TABLET | Freq: Every day | ORAL | 3 refills | Status: DC

## 2020-01-24 MED ORDER — CITALOPRAM HYDROBROMIDE 20 MG PO TABS: 20.0000 mg | ORAL_TABLET | Freq: Every day | ORAL | 3 refills | Status: DC

## 2020-01-24 NOTE — Progress Notes (Addendum)
Subjective:  Patient ID: Christina Huerta, female    DOB: 10/15/1960  Age: 59 y.o. MRN: [id number]  CC: Follow-up (Pt in need of medication refills(singular/Celexa) and skin tag removal. Pt states skin tag is located right below her bra line and one on the right side of her stomach.)  Rash This is a chronic problem. The current episode started more than 1 year ago. The problem has been gradually worsening since onset. The affected locations include the abdomen. The rash is characterized by redness and pain. She was exposed to nothing. Pertinent negatives include no fatigue or fever. Past treatments include nothing. Her past medical history is significant for allergies and asthma. There is no history of eczema or varicella.   Allergic rhinitis Stable with use on montelukast  Hyperglycemia Last hgbA1c of 5.7 Repeat hgbA1c today  Hypertriglyceridemia Persistent elevated triglyceride with normal hepatic panel Start fenofibrate Advised to maintain low cab/fat diet F/up in 26months Lipid Panel     Component Value Date/Time   CHOL 286 (H) 01/24/2020 1457   TRIG 445 (H) 01/24/2020 1457   HDL 47 01/24/2020 1457   CHOLHDL 6.1 (H) 01/24/2020 1457   CHOLHDL 6 03/03/2017 0911   VLDL 51.6 (H) 03/03/2017 0911   LDLCALC 154 (H) 01/24/2020 1457   LDLDIRECT 169.0 03/03/2017 0911   LABVLDL 85 (H) 01/24/2020 1457    Depression, major, single episode, complete remission (HCC) Stable mood with celexa  Reviewed past Medical, Social and Family history today.  Outpatient Medications Prior to Visit  Medication Sig Dispense Refill  . albuterol (PROVENTIL HFA;VENTOLIN HFA) 108 (90 Base) MCG/ACT inhaler Inhale 1-2 puffs into the lungs every 6 (six) hours as needed. 1 Inhaler 3  . Black Cohosh 80 MG CAPS Take 80 mg by mouth 1 day or 1 dose.    Marland Kitchen BLACK COHOSH PO Take by mouth.    . Multiple Vitamins-Minerals (MULTIVITAMIN ADULT PO) Take by mouth.    . Omega-3 Fatty Acids (OMEGA-3 FISH OIL PO) Take by  mouth.    Marland Kitchen OVER THE COUNTER MEDICATION Vinegar Tab    . traMADol (ULTRAM) 50 MG tablet Take 1 tablet (50 mg total) by mouth every 6 (six) hours as needed. 30 tablet 0  . VITAMIN E PO Take by mouth.    . citalopram (CELEXA) 20 MG tablet TAKE 1 TABLET BY MOUTH ONCE DAILY -  NO  FURTHER  REFILLS  WITHOUT  OFFICE  VISIT 90 tablet 0  . montelukast (SINGULAIR) 10 MG tablet Take 1 tablet (10 mg total) by mouth at bedtime. 90 tablet 3  . mometasone-formoterol (DULERA) 100-5 MCG/ACT AERO Inhale 2 puffs into the lungs 2 (two) times daily at 10 AM and 5 PM. 8.8 g 11  . cetirizine (ZYRTEC) 10 MG tablet Take 1 tablet (10 mg total) by mouth at bedtime. (Patient not taking: Reported on 01/24/2020) 30 tablet 0  . CINNAMON PO Take by mouth. (Patient not taking: Reported on 01/24/2020)    . fluticasone (FLONASE) 50 MCG/ACT nasal spray Place 2 sprays into both nostrils daily. (Patient not taking: Reported on 01/24/2020) 16 g 0  . methocarbamol (ROBAXIN) 500 MG tablet Take 1 tablet (500 mg total) by mouth 2 (two) times daily as needed. (Patient not taking: Reported on 01/24/2020) 20 tablet 0  . predniSONE (STERAPRED UNI-PAK 21 TAB) 10 MG (21) TBPK tablet Take as directed (Patient not taking: Reported on 01/24/2020) 21 tablet 0   No facility-administered medications prior to visit.   ROS See HPI  Skin Tag on RLQ abdominal wall. Procedure including risks/benefits explained to patient.   Questions were answered.  After informed consent was obtained and a time out completed. Site was cleansed with betadine and then alcohol. 1% Lidocaine with epinephrine was injected under lesion and then shave biopsy was performed (2 lesions removed, approximately 59mm). Manual pressure applied with guaze to obtain hemostasis.  Pt tolerated procedure well.  Specimen sent for pathology review.  Pt instructed to keep the area dry for 24 hours and to contact us if he develops redness, drainage or swelling at the site.  Pt may use tylenol as  needed for discomfort today.   Objective:  BP 124/80 (BP Location: Left Arm, Patient Position: Sitting, Cuff Size: Normal)   Pulse 84   Temp (!) 97 F (36.1 C) (Temporal)   Ht 5\' 9"  (1.753 m)   Wt 237 lb 3.2 oz (107.6 kg)   SpO2 96%   BMI 35.03 kg/m   Physical Exam Cardiovascular:     Rate and Rhythm: Normal rate and regular rhythm.     Pulses: Normal pulses.     Heart sounds: Normal heart sounds.  Pulmonary:     Effort: Pulmonary effort is normal.     Breath sounds: Normal breath sounds.  Musculoskeletal:     Cervical back: Normal range of motion and neck supple.     Right lower leg: No edema.     Left lower leg: No edema.  Skin:    Findings: Erythema and lesion present.       Neurological:     Mental Status: She is alert and oriented to person, place, and time.  Psychiatric:        Mood and Affect: Mood normal.        Behavior: Behavior normal.        Thought Content: Thought content normal.    Assessment & Plan:  This visit occurred during the SARS-CoV-2 public health emergency.  Safety protocols were in place, including screening questions prior to the visit, additional usage of staff PPE, and extensive cleaning of exam room while observing appropriate contact time as indicated for disinfecting solutions.   Christina Huerta was seen today for follow-up.  Diagnoses and all orders for this visit:  Hypertriglyceridemia -     Lipid panel -     Hepatic function panel -     fenofibrate (TRICOR) 145 MG tablet; Take 1 tablet (145 mg total) by mouth daily.  Hyperglycemia -     Basic metabolic panel -     Hemoglobin A1c  Recurrent major depressive disorder, in full remission (HCC) -     Basic metabolic panel  Skin tag -     Pathology  Recurrent major depressive disorder, remission status unspecified (HCC) -     citalopram (CELEXA) 20 MG tablet; Take 1 tablet (20 mg total) by mouth daily.  Chronic recurrent sinusitis -     montelukast (SINGULAIR) 10 MG tablet; Take 1  tablet (10 mg total) by mouth at bedtime.  Seasonal allergic rhinitis due to pollen -     montelukast (SINGULAIR) 10 MG tablet; Take 1 tablet (10 mg total) by mouth at bedtime.    Problem List Items Addressed This Visit      Respiratory   Allergic rhinitis    Stable with use on montelukast      Relevant Medications   montelukast (SINGULAIR) 10 MG tablet   Chronic recurrent sinusitis   Relevant Medications   montelukast (SINGULAIR) 10 MG  tablet     Other   Depression, major, single episode, complete remission (HCC)    Stable mood with celexa      Relevant Medications   citalopram (CELEXA) 20 MG tablet   Hyperglycemia    Last hgbA1c of 5.7 Repeat hgbA1c today      Relevant Orders   Basic metabolic panel (Completed)   Hemoglobin A1c (Completed)   Hypertriglyceridemia - Primary    Persistent elevated triglyceride with normal hepatic panel Start fenofibrate Advised to maintain low cab/fat diet F/up in 65months Lipid Panel     Component Value Date/Time   CHOL 286 (H) 01/24/2020 1457   TRIG 445 (H) 01/24/2020 1457   HDL 47 01/24/2020 1457   CHOLHDL 6.1 (H) 01/24/2020 1457   CHOLHDL 6 03/03/2017 0911   VLDL 51.6 (H) 03/03/2017 0911   LDLCALC 154 (H) 01/24/2020 1457   LDLDIRECT 169.0 03/03/2017 0911   LABVLDL 85 (H) 01/24/2020 1457        Relevant Medications   fenofibrate (TRICOR) 145 MG tablet   Other Relevant Orders   Lipid panel (Completed)   Hepatic function panel (Completed)    Other Visit Diagnoses    Skin tag       Relevant Orders   Pathology      Follow-up: Return in about 1 year (around 01/23/2021) for CPE (fasting).  Christina Penna, NP

## 2020-01-24 NOTE — Patient Instructions (Signed)
Go to lab for blood draw.

## 2020-01-24 NOTE — Assessment & Plan Note (Signed)
Stable with use on montelukast

## 2020-01-24 NOTE — Assessment & Plan Note (Signed)
Stable mood with celexa

## 2020-01-24 NOTE — Assessment & Plan Note (Signed)
Last hgbA1c of 5.7 Repeat hgbA1c today

## 2020-01-24 NOTE — Assessment & Plan Note (Addendum)
Persistent elevated triglyceride with normal hepatic panel Start fenofibrate Advised to maintain low cab/fat diet F/up in 34months Lipid Panel     Component Value Date/Time   CHOL 286 (H) 01/24/2020 1457   TRIG 445 (H) 01/24/2020 1457   HDL 47 01/24/2020 1457   CHOLHDL 6.1 (H) 01/24/2020 1457   CHOLHDL 6 03/03/2017 0911   VLDL 51.6 (H) 03/03/2017 0911   LDLCALC 154 (H) 01/24/2020 1457   LDLDIRECT 169.0 03/03/2017 0911   LABVLDL 85 (H) 01/24/2020 1457

## 2020-01-25 LAB — HEPATIC FUNCTION PANEL
ALT: 27 IU/L (ref 0–32)
AST: 17 IU/L (ref 0–40)
Albumin: 4.6 g/dL (ref 3.8–4.9)
Alkaline Phosphatase: 92 IU/L (ref 44–121)
Bilirubin Total: 0.4 mg/dL (ref 0.0–1.2)
Bilirubin, Direct: 0.11 mg/dL (ref 0.00–0.40)
Total Protein: 7.3 g/dL (ref 6.0–8.5)

## 2020-01-25 LAB — BASIC METABOLIC PANEL
BUN/Creatinine Ratio: 21 (ref 9–23)
BUN: 16 mg/dL (ref 6–24)
CO2: 26 mmol/L (ref 20–29)
Calcium: 9.9 mg/dL (ref 8.7–10.2)
Chloride: 100 mmol/L (ref 96–106)
Creatinine, Ser: 0.76 mg/dL (ref 0.57–1.00)
GFR calc Af Amer: 101 mL/min/{1.73_m2} (ref >59)
GFR calc non Af Amer: 87 mL/min/{1.73_m2} (ref >59)
Glucose: 99 mg/dL (ref 65–99)
Potassium: 4 mmol/L (ref 3.5–5.2)
Sodium: 138 mmol/L (ref 134–144)

## 2020-01-25 LAB — LIPID PANEL
Chol/HDL Ratio: 6.1 ratio — ABNORMAL HIGH (ref 0.0–4.4)
Cholesterol, Total: 286 mg/dL — ABNORMAL HIGH (ref 100–199)
HDL: 47 mg/dL (ref >39)
LDL Chol Calc (NIH): 154 mg/dL — ABNORMAL HIGH (ref 0–99)
Triglycerides: 445 mg/dL — ABNORMAL HIGH (ref 0–149)
VLDL Cholesterol Cal: 85 mg/dL — ABNORMAL HIGH (ref 5–40)

## 2020-01-25 LAB — HEMOGLOBIN A1C
Est. average glucose Bld gHb Est-mCnc: 111 mg/dL
Hgb A1c MFr Bld: 5.5 % (ref 4.8–5.6)

## 2020-01-27 MED ORDER — FENOFIBRATE 145 MG PO TABS: 145.0000 mg | ORAL_TABLET | Freq: Every day | ORAL | 1 refills | Status: DC

## 2020-01-27 NOTE — Addendum Note (Signed)
Addended by: Michaela Corner on: 01/27/2020 08:23 AM   Modules accepted: Orders

## 2020-01-28 LAB — PATHOLOGY REPORT

## 2020-04-21 ENCOUNTER — Encounter: Payer: Self-pay | Admitting: Nurse Practitioner

## 2020-04-29 ENCOUNTER — Other Ambulatory Visit: Payer: Self-pay

## 2020-04-29 ENCOUNTER — Ambulatory Visit: Payer: Self-pay | Admitting: Family

## 2020-04-29 ENCOUNTER — Encounter: Payer: Self-pay | Admitting: Family

## 2020-04-29 VITALS — Ht 69.0 in

## 2020-04-29 DIAGNOSIS — R399 Unspecified symptoms and signs involving the genitourinary system: Secondary | ICD-10-CM

## 2020-04-29 DIAGNOSIS — N3000 Acute cystitis without hematuria: Secondary | ICD-10-CM

## 2020-04-29 LAB — POCT URINALYSIS DIPSTICK
Bilirubin, UA: NEGATIVE
Blood, UA: NEGATIVE
Glucose, UA: NEGATIVE
Ketones, UA: NEGATIVE
Leukocytes, UA: NEGATIVE
Nitrite, UA: POSITIVE
Protein, UA: NEGATIVE
Spec Grav, UA: 1.025 (ref 1.010–1.025)
Urobilinogen, UA: 1 E.U./dL
pH, UA: 6 (ref 5.0–8.0)

## 2020-04-29 MED ORDER — SULFAMETHOXAZOLE-TRIMETHOPRIM 800-160 MG PO TABS
1.0000 | ORAL_TABLET | Freq: Two times a day (BID) | ORAL | 0 refills | Status: DC
Start: 2020-04-29 — End: 2021-06-04

## 2020-04-29 NOTE — Patient Instructions (Signed)
Urinary Tract Infection, Adult A urinary tract infection (UTI) is an infection of any part of the urinary tract. The urinary tract includes:  The kidneys.  The ureters.  The bladder.  The urethra. These organs make, store, and get rid of pee (urine) in the body. What are the causes? This is caused by germs (bacteria) in your genital area. These germs grow and cause swelling (inflammation) of your urinary tract. What increases the risk? You are more likely to develop this condition if:  You have a small, thin tube (catheter) to drain pee.  You cannot control when you pee or poop (incontinence).  You are female, and: ? You use these methods to prevent pregnancy:  A medicine that kills sperm (spermicide).  A device that blocks sperm (diaphragm). ? You have low levels of a female hormone (estrogen). ? You are pregnant.  You have genes that add to your risk.  You are sexually active.  You take antibiotic medicines.  You have trouble peeing because of: ? A prostate that is bigger than normal, if you are female. ? A blockage in the part of your body that drains pee from the bladder (urethra). ? A kidney stone. ? A nerve condition that affects your bladder (neurogenic bladder). ? Not getting enough to drink. ? Not peeing often enough.  You have other conditions, such as: ? Diabetes. ? A weak disease-fighting system (immune system). ? Sickle cell disease. ? Gout. ? Injury of the spine. What are the signs or symptoms? Symptoms of this condition include:  Needing to pee right away (urgently).  Peeing often.  Peeing small amounts often.  Pain or burning when peeing.  Blood in the pee.  Pee that smells bad or not like normal.  Trouble peeing.  Pee that is cloudy.  Fluid coming from the vagina, if you are female.  Pain in the belly or lower back. Other symptoms include:  Throwing up (vomiting).  No urge to eat.  Feeling mixed up (confused).  Being tired  and grouchy (irritable).  A fever.  Watery poop (diarrhea). How is this treated? This condition may be treated with:  Antibiotic medicine.  Other medicines.  Drinking enough water. Follow these instructions at home:  Medicines  Take over-the-counter and prescription medicines only as told by your doctor.  If you were prescribed an antibiotic medicine, take it as told by your doctor. Do not stop taking it even if you start to feel better. General instructions  Make sure you: ? Pee until your bladder is empty. ? Do not hold pee for a long time. ? Empty your bladder after sex. ? Wipe from front to back after pooping if you are a female. Use each tissue one time when you wipe.  Drink enough fluid to keep your pee pale yellow.  Keep all follow-up visits as told by your doctor. This is important. Contact a doctor if:  You do not get better after 1-2 days.  Your symptoms go away and then come back. Get help right away if:  You have very bad back pain.  You have very bad pain in your lower belly.  You have a fever.  You are sick to your stomach (nauseous).  You are throwing up. Summary  A urinary tract infection (UTI) is an infection of any part of the urinary tract.  This condition is caused by germs in your genital area.  There are many risk factors for a UTI. These include having a small, thin   tube to drain pee and not being able to control when you pee or poop.  Treatment includes antibiotic medicines for germs.  Drink enough fluid to keep your pee pale yellow. This information is not intended to replace advice given to you by your health care provider. Make sure you discuss any questions you have with your health care provider. Document Revised: 04/05/2018 Document Reviewed: 10/26/2017 Elsevier Patient Education  2020 [employer] Inc.  

## 2020-04-29 NOTE — Progress Notes (Signed)
Patient dropped off a specimen and was not seen by provider. Verbalizes not being able to wear a mask.

## 2020-04-30 ENCOUNTER — Ambulatory Visit: Payer: Self-pay | Admitting: Family Medicine

## 2020-05-02 ENCOUNTER — Encounter: Payer: Self-pay | Admitting: Nurse Practitioner

## 2020-05-02 DIAGNOSIS — R350 Frequency of micturition: Secondary | ICD-10-CM

## 2020-05-04 ENCOUNTER — Other Ambulatory Visit: Payer: Self-pay | Admitting: Family

## 2020-05-04 DIAGNOSIS — R350 Frequency of micturition: Secondary | ICD-10-CM

## 2020-06-01 DIAGNOSIS — N3281 Overactive bladder: Secondary | ICD-10-CM | POA: Insufficient documentation

## 2021-02-01 ENCOUNTER — Encounter: Payer: Self-pay | Admitting: Nurse Practitioner

## 2021-02-01 DIAGNOSIS — F339 Major depressive disorder, recurrent, unspecified: Secondary | ICD-10-CM

## 2021-02-02 MED ORDER — CITALOPRAM HYDROBROMIDE 20 MG PO TABS: 20.0000 mg | ORAL_TABLET | Freq: Every day | ORAL | 0 refills | Status: DC

## 2021-05-13 ENCOUNTER — Telehealth: Payer: Self-pay | Admitting: Nurse Practitioner

## 2021-05-13 ENCOUNTER — Encounter: Payer: Self-pay | Admitting: Family Medicine

## 2021-05-13 ENCOUNTER — Ambulatory Visit: Payer: Self-pay | Admitting: Family Medicine

## 2021-05-13 NOTE — Telephone Encounter (Signed)
Patient arrived for her New Patient appt. (Dr. Matthews @3:30p) - (Late 3:39) with no paperwork. I explained that for a New patient appt. We do require arrival 20 minutes prior to your appt. The patient got loud and stated she needs her meds refilled. I told her I would check with Dr. Matthews... Dr. Matthews stated she would need to reschedule this appt. He has already moved on to other patients. °I returned to the front office and apologized to the patient and tried to explain that I would be more than happy to reschedule her appt. That the doctor has moved on with other patients (per Dr. Mathews). The patient yelled and took off her mask and threw it at me, then stated she will find another doctor, and stormed out. °

## 2021-05-13 NOTE — Telephone Encounter (Signed)
Patient arrived for her New Patient appt. (Dr. Ashley Royalty @3 :30p) - (Late 3:39) with no paperwork. I explained that for a New patient appt. We do require arrival 20 minutes prior to your appt. The patient got loud and stated she needs her meds refilled. I told her I would check with Dr. ... Dr. Ashley Royalty stated she would need to reschedule this appt. He has already moved on to other patients. I returned to the front office and apologized to the patient and tried to explain that I would be more than happy to reschedule her appt. That the doctor has moved on with other patients (per Dr. Ashley Royalty). The patient yelled and took off her mask and threw it at me, then stated she will find another doctor, and stormed out.

## 2021-05-19 ENCOUNTER — Encounter: Payer: Self-pay | Admitting: Family Medicine

## 2021-06-04 ENCOUNTER — Ambulatory Visit (HOSPITAL_COMMUNITY)
Admission: EM | Admit: 2021-06-04 | Discharge: 2021-06-04 | Disposition: A | Discharge status: Home / Self Care | Payer: No Payment, Other | Attending: Family | Admitting: Family

## 2021-06-04 ENCOUNTER — Other Ambulatory Visit: Payer: Self-pay

## 2021-06-04 ENCOUNTER — Telehealth: Payer: Self-pay | Admitting: Nurse Practitioner

## 2021-06-04 ENCOUNTER — Ambulatory Visit (INDEPENDENT_AMBULATORY_CARE_PROVIDER_SITE_OTHER): Payer: Self-pay | Admitting: Nurse Practitioner

## 2021-06-04 VITALS — BP 158/100 | HR 110 | Temp 97.4°F | Resp 16 | Ht 69.0 in | Wt 245.2 lb

## 2021-06-04 DIAGNOSIS — I10 Essential (primary) hypertension: Secondary | ICD-10-CM | POA: Insufficient documentation

## 2021-06-04 DIAGNOSIS — R45851 Suicidal ideations: Secondary | ICD-10-CM | POA: Insufficient documentation

## 2021-06-04 DIAGNOSIS — G8929 Other chronic pain: Secondary | ICD-10-CM | POA: Insufficient documentation

## 2021-06-04 DIAGNOSIS — Z79899 Other long term (current) drug therapy: Secondary | ICD-10-CM | POA: Insufficient documentation

## 2021-06-04 DIAGNOSIS — G4489 Other headache syndrome: Secondary | ICD-10-CM

## 2021-06-04 DIAGNOSIS — F411 Generalized anxiety disorder: Secondary | ICD-10-CM | POA: Insufficient documentation

## 2021-06-04 DIAGNOSIS — Z760 Encounter for issue of repeat prescription: Secondary | ICD-10-CM | POA: Insufficient documentation

## 2021-06-04 MED ORDER — CITALOPRAM HYDROBROMIDE 10 MG PO TABS: 10.0000 mg | ORAL_TABLET | Freq: Every day | ORAL | 0 refills | Status: DC

## 2021-06-04 NOTE — Assessment & Plan Note (Addendum)
Patient presents to front desk crying and telling her office staff that she is having suicidal thoughts.  Patient has been maintained on Celexa in the past.  Has been out of her medication for at least a month.  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  Patient does state that she does not drive currently.  Did involve her spouse at bedside this is my first meeting with the patient seeing if she seems fairly stable/normal herself and has been admits that she does not.  Given that patient is very labile and crying in the room having SI with plan I do feel like she is not suitable for outpatient follow-up at current time and recommend that she go to the behavioral health urgent care.  Per chart patient is currently at behavioral health urgent care in [facility] waiting evaluation.  Had detailed discussions about reasoning to be evaluated by behavioral health specialist as patient's been having these thoughts for several weeks but increasing in frequency and going back on medication will take weeks for therapeutic effect.

## 2021-06-04 NOTE — ED Triage Notes (Signed)
Pt Christina Huerta presents to [facility] with her husband. Pt states that she is out of her anxiety medication and has been having worsening symptoms of anxiety and has been overwhelmed and has complaints of headaches due to being off of her medication. Pt is tearful during triage process. Pt states that she has been out of her citalopram 10 mg,  for about a month. Pt states that she has been dealing with the death of family members recently and has been taking more than prescribed. Pt states that she has been having hallucinations of smelling things that are not there " I can smell someone smoking as if they are right there but no one is smoking near me". Pt was recommended by her PCP to come to [facility] for an evaluation. Pt denies SI/HI and AVH. Pt is routine.

## 2021-06-04 NOTE — Assessment & Plan Note (Signed)
Patient did have elevated blood pressure in office.  No history of hypertension per chart not currently on antihypertensive medications patient was crying and having very labile mood in office I think being "worked up" did attribute to some of her elevated blood pressure readings.  Upon recheck she did have a modest decrease of systolic blood pressure but diastolic still elevated.  Her blood pressure also to be exacerbated as she is dealing with several herniated disks and difficulty ambulating and sitting still in regards to a fall that is being handled through [employer] claim/case

## 2021-06-04 NOTE — Discharge Instructions (Addendum)

## 2021-06-04 NOTE — Patient Instructions (Addendum)
You need to go directly to:  [facility]  This is an urgent care to help your needs The address is    [address]  [place] [address] Phone number [phone]  I am going to call and make sure that you have made it

## 2021-06-04 NOTE — Assessment & Plan Note (Signed)
Patient also complaining of headache today discovered multifactorial as patient is upset and labile in mood.  Patient also hypertensive in office with no history of the same.  Upon talking and resting systolic blood pressure decreased modestly.  Does have a history of migraines but this is different.  Patient talked to the pharmacist and states could be from discontinuation of SSRI medication.  Do agree with his assessment but I do feel after 1 month of being off medication she should have stabilized, in regards to her headaches.  Ultimate recommendation was to go to emergency department where they can evaluate both and have her evaluated immediately with behavioral health and medically cleared her.  Patient and spouse does not want to go to emergency department they want avoid that as she is currently uninsured.  Compromise of getting behavioral health issue evaluated as this is the greatest priority currently

## 2021-06-04 NOTE — Progress Notes (Signed)
Acute Office Visit  Subjective:    Patient ID: Christina Huerta, female    DOB: Sep 20, 1960, 61 y.o.   MRN: [id number]  Chief Complaint  Patient presents with   Suicidal thoughts    Has been out of her medication for a month-Celexa. Has had these thoughts for about 2 weeks. Headache for a month    HPI Patient is in today for Suicidal thought and headaches  Depression: States that she was splitting her medications in half and has been having SI thoughts. States that she is up once a week feeling like she is going to die. States that she has driven over a bridge before and thought about how fast she would have to go.  Never hurt herself in the past. Never had inpatient treatment in the past for beahvioral health. Spouse is at bedside. She is going through a lot with her back since having a fall at work and having a workers [employer] engineercompensation claim.   Headaches: States that she has been having "rotten headaches" for about a month. Every day. Sometimes it worse than others. Feels like an axe in her head. Shrap splitting. States that she has tried excedrin tyelenol, aleve and ibuprofen. Can ease it a little bit.    PHQ9 SCORE ONLY 06/04/2021 02/27/2018 03/03/2017  PHQ-9 Total Score 12 11 9     GAD 7 : Generalized Anxiety Score 06/04/2021 02/27/2018 03/03/2017  Nervous, Anxious, on Edge 3 1 0  Control/stop worrying 1 2 0  Worry too much - different things 1 1 1   Trouble relaxing 1 1 0  Restless 2 0 0  Easily annoyed or irritable 3 1 1   Afraid - awful might happen 2 0 0  Total GAD 7 Score 13 6 2   Anxiety Difficulty Somewhat difficult - -      Past Medical History:  Diagnosis Date   Allergy    Arthritis    Asthma    Depression    Hypertriglyceridemia 03/03/2017   IBS (irritable bowel syndrome)    Kidney stone    Migraines     Past Surgical History:  Procedure Laterality Date   CESAREAN SECTION  1996   scar neunoma surgery  1998    Family History  Problem Relation Age of Onset    Hyperlipidemia Mother    Hyperlipidemia Father    Heart disease Father    Hypertension Father    Kidney disease Father    Arthritis Maternal Grandmother    Hyperlipidemia Maternal Grandmother    Cancer Maternal Grandfather        prostate cancer   Hyperlipidemia Maternal Grandfather    Arthritis Paternal Grandmother    Hyperlipidemia Paternal Grandmother    Cancer Paternal Grandfather        colon cancer   Hyperlipidemia Paternal Grandfather    Heart disease Paternal Grandfather    Stroke Paternal Grandfather    Hypertension Paternal Grandfather     Social History   Socioeconomic History   Marital status: Married    Spouse name: Not on file   Number of children: Not on file   Years of education: Not on file   Highest education level: Not on file  Occupational History   Not on file  Tobacco Use   Smoking status: Never   Smokeless tobacco: Never  Vaping Use   Vaping Use: Never used  Substance and Sexual Activity   Alcohol use: Yes    Comment: social   Drug use: No   Sexual activity:  Not on file  Other Topics Concern   Not on file  Social History Narrative   Not on file   Social Determinants of Health   Financial Resource Strain: Not on file  Food Insecurity: Not on file  Transportation Needs: Not on file  Physical Activity: Not on file  Stress: Not on file  Social Connections: Not on file  Intimate Partner Violence: Not on file    Outpatient Medications Prior to Visit  Medication Sig Dispense Refill   albuterol (PROVENTIL HFA;VENTOLIN HFA) 108 (90 Base) MCG/ACT inhaler Inhale 1-2 puffs into the lungs every 6 (six) hours as needed. 1 Inhaler 3   Black Cohosh 80 MG CAPS Take 80 mg by mouth 1 day or 1 dose.     Evening Primrose Oil 500 MG CAPS Take by mouth.     mometasone-formoterol (DULERA) 100-5 MCG/ACT AERO Inhale 2 puffs into the lungs 2 (two) times daily at 10 AM and 5 PM. 8.8 g 11   montelukast (SINGULAIR) 10 MG tablet Take 1 tablet (10 mg total) by  mouth at bedtime. 90 tablet 3   Multiple Vitamins-Minerals (MULTIVITAMIN ADULT PO) Take by mouth.     Omega-3 Fatty Acids (OMEGA-3 FISH OIL PO) Take by mouth.     OVER THE COUNTER MEDICATION Vinegar Tab     tiZANidine (ZANAFLEX) 4 MG tablet Take by mouth.     VITAMIN E PO Take by mouth.     citalopram (CELEXA) 20 MG tablet Take 1 tablet (20 mg total) by mouth daily. Need office visit for additional refills (Patient not taking: Reported on 06/04/2021) 90 tablet 0   BLACK COHOSH PO Take by mouth.     fenofibrate (TRICOR) 145 MG tablet Take 1 tablet (145 mg total) by mouth daily. 90 tablet 1   sulfamethoxazole-trimethoprim (BACTRIM DS) 800-160 MG tablet Take 1 tablet by mouth 2 (two) times daily. 14 tablet 0   traMADol (ULTRAM) 50 MG tablet Take 1 tablet (50 mg total) by mouth every 6 (six) hours as needed. 30 tablet 0   No facility-administered medications prior to visit.    Allergies  Allergen Reactions   Latex Other (See Comments) and Rash    Break out "water welts"   Sumatriptan Other (See Comments) and Rash    Face swelling,break out,red.  Only IM   Astemizole Nausea Only   Oxycodone Rash    Review of Systems  Constitutional:  Negative for chills and fever.  Eyes:  Negative for visual disturbance.  Respiratory:  Positive for shortness of breath (baseline). Negative for cough.   Cardiovascular:  Negative for chest pain.  Gastrointestinal:  Negative for abdominal pain, diarrhea, nausea and vomiting.  Neurological:  Positive for headaches. Negative for weakness and numbness.  Psychiatric/Behavioral:  Positive for suicidal ideas. Negative for hallucinations and self-injury.       Objective:    Physical Exam Vitals and nursing note reviewed.  Constitutional:      Comments: Husband at bedside  Cardiovascular:     Rate and Rhythm: Regular rhythm. Tachycardia present.  Pulmonary:     Effort: Pulmonary effort is normal.     Breath sounds: Normal breath sounds.  Abdominal:      General: Bowel sounds are normal.  Musculoskeletal:     Comments: Walks with a cain  Neurological:     Mental Status: She is alert. Mental status is at baseline.     Deep Tendon Reflexes:     Reflex Scores:  Bicep reflexes are 2+ on the right side and 2+ on the left side.      Patellar reflexes are 1+ on the right side and 2+ on the left side.    Comments: Breakthrough weakness with lower extremities  Psychiatric:        Mood and Affect: Mood is anxious. Affect is labile and tearful.        Speech: Speech normal.        Behavior: Behavior is cooperative.        Thought Content: Thought content includes suicidal ideation. Thought content includes suicidal plan.        Cognition and Memory: Cognition normal.     Comments: Patient states that the thoughts are becoming more and more often. She has thought about driving her car off the bridge. She knows what bridge she would do it on and have also thought about how fast she would need to go to enact the plan    BP (!) 158/100    Pulse (!) 110    Temp (!) 97.4 F (36.3 C)    Resp 16    Ht 5\' 9"  (1.753 m)    Wt 245 lb 4 oz (111.2 kg)    SpO2 95%    BMI 36.22 kg/m  Wt Readings from Last 3 Encounters:  06/04/21 245 lb 4 oz (111.2 kg)  01/24/20 237 lb 3.2 oz (107.6 kg)  11/12/19 236 lb (107 kg)    Health Maintenance Due  Topic Date Due   COVID-19 Vaccine (1) Never done   HIV Screening  Never done   Zoster Vaccines- Shingrix (1 of 2) Never done   PAP SMEAR-Modifier  Never done   COLONOSCOPY (Pts 45-23yrs Insurance coverage will need to be confirmed)  Never done   MAMMOGRAM  Never done   INFLUENZA VACCINE  Never done    There are no preventive care reminders to display for this patient.   Lab Results  Component Value Date   TSH 1.37 01/14/2019   Lab Results  Component Value Date   WBC 5.7 01/14/2019   HGB 14.8 01/14/2019   HCT 42.3 01/14/2019   MCV 93.3 01/14/2019   PLT 355.0 01/14/2019   Lab Results  Component Value  Date   NA 138 01/24/2020   K 4.0 01/24/2020   CO2 26 01/24/2020   GLUCOSE 99 01/24/2020   BUN 16 01/24/2020   CREATININE 0.76 01/24/2020   BILITOT 0.4 01/24/2020   ALKPHOS 92 01/24/2020   AST 17 01/24/2020   ALT 27 01/24/2020   PROT 7.3 01/24/2020   ALBUMIN 4.6 01/24/2020   CALCIUM 9.9 01/24/2020   GFR 106.15 02/27/2018   Lab Results  Component Value Date   CHOL 286 (H) 01/24/2020   Lab Results  Component Value Date   HDL 47 01/24/2020   Lab Results  Component Value Date   LDLCALC 154 (H) 01/24/2020   Lab Results  Component Value Date   TRIG 445 (H) 01/24/2020   Lab Results  Component Value Date   CHOLHDL 6.1 (H) 01/24/2020   Lab Results  Component Value Date   HGBA1C 5.5 01/24/2020       Assessment & Plan:   Problem List Items Addressed This Visit       Cardiovascular and Mediastinum   Elevated blood pressure reading in office with diagnosis of hypertension    Patient did have elevated blood pressure in office.  No history of hypertension per chart not currently on antihypertensive medications  patient was crying and having very labile mood in office I think being "worked up" did attribute to some of her elevated blood pressure readings.  Upon recheck she did have a modest decrease of systolic blood pressure but diastolic still elevated.  Her blood pressure also to be exacerbated as she is dealing with several herniated disks and difficulty ambulating and sitting still in regards to a fall that is being handled through [employer] claim/case        Other   Suicidal ideations - Primary    Patient presents to front desk crying and telling her office staff that she is having suicidal thoughts.  Patient has been maintained on Celexa in the past.  Has been out of her medication for at least a month.  Patient states she is having thoughts of driving her vehicle over a bridge she does which bridge she would do it on and has thought about speeds in which she  would have to go to carry out the act.  Patient does state that she does not drive currently.  Did involve her spouse at bedside this is my first meeting with the patient seeing if she seems fairly stable/normal herself and has been admits that she does not.  Given that patient is very labile and crying in the room having SI with plan I do feel like she is not suitable for outpatient follow-up at current time and recommend that she go to the behavioral health urgent care.  Per chart patient is currently at behavioral health urgent care in [facility] waiting evaluation.  Had detailed discussions about reasoning to be evaluated by behavioral health specialist as patient's been having these thoughts for several weeks but increasing in frequency and going back on medication will take weeks for therapeutic effect.      Other headache syndrome    Patient also complaining of headache today discovered multifactorial as patient is upset and labile in mood.  Patient also hypertensive in office with no history of the same.  Upon talking and resting systolic blood pressure decreased modestly.  Does have a history of migraines but this is different.  Patient talked to the pharmacist and states could be from discontinuation of SSRI medication.  Do agree with his assessment but I do feel after 1 month of being off medication she should have stabilized, in regards to her headaches.  Ultimate recommendation was to go to emergency department where they can evaluate both and have her evaluated immediately with behavioral health and medically cleared her.  Patient and spouse does not want to go to emergency department they want avoid that as she is currently uninsured.  Compromise of getting behavioral health issue evaluated as this is the greatest priority currently      Relevant Medications   tiZANidine (ZANAFLEX) 4 MG tablet     No orders of the defined types were placed in this encounter.  This visit occurred during  the SARS-CoV-2 public health emergency.  Safety protocols were in place, including screening questions prior to the visit, additional usage of staff PPE, and extensive cleaning of exam room while observing appropriate contact time as indicated for disinfecting solutions.    Audria Nine, NP

## 2021-06-04 NOTE — Telephone Encounter (Signed)
Correct I did have the conversation that it was a urgent care. I did call and ask to make sure that she can be seen. I spoke with the social worker there. Unfortunately I cannot give them a wait time as to when she will be seen. The alternative was the emergency department which they did not want to do

## 2021-06-04 NOTE — Discharge Summary (Signed)
[name] to be D/C'd Home per NP order. Discussed with the patient and all questions fully answered. An After Visit Summary was printed and given to the patient. Patient escorted out and D/C home via private auto.  Christina Huerta  06/04/2021 1:44 PM

## 2021-06-04 NOTE — ED Provider Notes (Signed)
Behavioral Health Urgent Care Medical Screening Exam  Patient Name: Christina Huerta MRN: [id number] Date of Evaluation: 06/04/21 Chief Complaint:   Diagnosis:  Final diagnoses:  GAD (generalized anxiety disorder)    History of Present illness: Christina Huerta is a 61 y.o. female. Patient presents voluntarily to [facility] behavioral health for walk-in assessment.  Patient is accompanied by her husband, Annette Stable. Patient prefers that her husband remain present for assessment.  Christina Huerta reports she mainly presents today for medication refill.  She ran out of her citalopram several weeks ago.  She reports becoming frustrated as she was late for an appointment to primary care in January and asked to reschedule.  Today there was confusion surrounding the date of her appointment and she presented for appointment on today, 2/3, however her appointment was actually scheduled for 3/3. Patient reported suicidal ideation at primary care provider office earlier today.  She reports suicidal ideations actually occurred 1 year ago.  She denies suicidal and homicidal ideations at this time.  She denies any history of suicide attempts.  She denies any history of nonsuicidal self-harm behavior.  She easily contracts for safety with this Clinical research associate.  Patient is assessed face-to-face by nurse practitioner.  She is seated in assessment area, no acute distress.  She is alert and oriented, pleasant and cooperative during assessment.   Christina Huerta reports recent stressors including fall approximately 1 year ago resulting in "a broken back."  She has been suffering from pain and inability to drive for 1 year related to injury.  She is also attending physical therapy several times per week.  Patient has also lost her healthcare insurance related to inability to afford COBRA policy offered by her employer.  Christina Huerta has been diagnosed with generalized anxiety disorder.  She reports she has been stable on citalopram 10 mg daily for more than  20 years.  She has not linked with outpatient psychiatry, she does not like with outpatient counseling.  She would like to follow-up with outpatient psychiatry moving forward.  She denies history of inpatient psychiatric hospitalizations.  She denies family history of mental health diagnoses.  Christina Huerta experiences chronic pain, intermittent x one year.  She reports some decreased sleep related to pain.  Her pain is managed effectively by primary care provider, per her report.  She reports she is compliant with all medications.  Discussed recommendation to follow-up with outpatient primary care provider related to elevated blood pressure reading, patient verbalizes understanding.  Patient declines emergency department follow-up at this time.  She presents with anxious mood, congruent affect. She has normal speech and behavior.  She denies both auditory and visual hallucinations.  Patient is able to converse coherently with goal-directed thoughts and no distractibility or preoccupation.  She denies paranoia.  Objectively there is no evidence of psychosis/mania or delusional thinking.  Christina Huerta resides in [place] with her husband, she denies access to weapons.  She is currently not employed, receiving Worker's Compensation benefits.  Patient endorses average appetite.  She denies alcohol and substance use.  Patient offered support and encouragement. Patient gives verbal consent to speak with her husband, Annette Stable.  Patient's husband denies concern for patient's safety, he agrees with plan for patient to follow-up with outpatient psychiatry.  Patient and husband are educated and verbalize understanding of mental health resources and other crisis services in the community. They are instructed to call 911 and present to the nearest emergency room should patient experience any suicidal/homicidal ideation, auditory/visual/hallucinations, or detrimental worsening of her mental health condition.  Psychiatric  Specialty Exam  Presentation  General Appearance:Appropriate for Environment; Casual  Eye Contact:Good  Speech:Clear and Coherent; Normal Rate  Speech Volume:Normal  Handedness:Right   Mood and Affect  Mood:Anxious  Affect:Congruent   Thought Process  Thought Processes:Coherent; Goal Directed; Linear  Descriptions of Associations:Intact  Orientation:Full (Time, Place and Person)  Thought Content:Logical; WDL    Hallucinations:None  Ideas of Reference:None  Suicidal Thoughts:No  Homicidal Thoughts:No   Sensorium  Memory:Immediate Good; Recent Good  Judgment:Fair  Insight:Fair   Executive Functions  Concentration:Good  Attention Span:Good  Recall:Good  Fund of Knowledge:Good  Language:Good   Psychomotor Activity  Psychomotor Activity:Normal   Assets  Assets:Communication Skills; Desire for Improvement; Housing; Intimacy; Leisure Time; Social Support; Resilience   Sleep  Sleep:Fair  Number of hours: No data recorded  No data recorded  Physical Exam: Physical Exam Vitals and nursing note reviewed.  Constitutional:      Appearance: Normal appearance. She is well-developed.  HENT:     Head: Normocephalic and atraumatic.     Nose: Nose normal.  Cardiovascular:     Rate and Rhythm: Tachycardia present.  Pulmonary:     Effort: Pulmonary effort is normal.  Musculoskeletal:        General: Normal range of motion.     Cervical back: Normal range of motion.  Skin:    General: Skin is warm and dry.  Neurological:     Mental Status: She is alert and oriented to person, place, and time.  Psychiatric:        Attention and Perception: Attention and perception normal.        Mood and Affect: Affect normal. Mood is anxious.        Speech: Speech normal.        Behavior: Behavior normal. Behavior is cooperative.        Thought Content: Thought content normal.        Cognition and Memory: Cognition and memory normal.   Review of Systems   Constitutional: Negative.   HENT: Negative.    Eyes: Negative.   Respiratory: Negative.    Cardiovascular: Negative.   Gastrointestinal: Negative.   Genitourinary: Negative.   Musculoskeletal: Negative.   Skin: Negative.   Neurological: Negative.   Endo/Heme/Allergies: Negative.   Psychiatric/Behavioral:  The patient is nervous/anxious.   Blood pressure (!) 154/104, pulse (!) 102, temperature 98.6 F (37 C), temperature source Oral, resp. rate 18, height 5\' 9"  (1.753 m), weight 245 lb (111.1 kg), SpO2 97 %. Body mass index is 36.18 kg/m.  Musculoskeletal: Strength & Muscle Tone: within normal limits Gait & Station: normal Patient leans: N/A   [facility] MSE Discharge Disposition for Follow up and Recommendations: Based on my evaluation the patient does not appear to have an emergency medical condition and can be discharged with resources and follow up care in outpatient services for Medication Management and Individual Therapy Patient reviewed with Dr. . Follow-up with outpatient psychiatry, resources provided. Current medications include: -Citalopram 10 mg daily/mood 30-day refill of citalopram provided. Follow-up with primary care provider as directed.  Bronwen Betters, FNP 06/04/2021, 1:34 PM

## 2021-06-07 ENCOUNTER — Encounter: Payer: Self-pay | Admitting: Nurse Practitioner

## 2021-07-02 ENCOUNTER — Encounter: Payer: Self-pay | Admitting: Nurse Practitioner

## 2021-07-02 ENCOUNTER — Other Ambulatory Visit: Payer: Self-pay

## 2021-07-02 ENCOUNTER — Ambulatory Visit (INDEPENDENT_AMBULATORY_CARE_PROVIDER_SITE_OTHER): Payer: Self-pay | Admitting: Nurse Practitioner

## 2021-07-02 VITALS — BP 134/80 | HR 88 | Temp 98.1°F | Ht 69.0 in | Wt 246.4 lb

## 2021-07-02 DIAGNOSIS — E781 Pure hyperglyceridemia: Secondary | ICD-10-CM

## 2021-07-02 DIAGNOSIS — F339 Major depressive disorder, recurrent, unspecified: Secondary | ICD-10-CM

## 2021-07-02 DIAGNOSIS — R739 Hyperglycemia, unspecified: Secondary | ICD-10-CM

## 2021-07-02 DIAGNOSIS — J301 Allergic rhinitis due to pollen: Secondary | ICD-10-CM

## 2021-07-02 DIAGNOSIS — G4489 Other headache syndrome: Secondary | ICD-10-CM

## 2021-07-02 DIAGNOSIS — J4520 Mild intermittent asthma, uncomplicated: Secondary | ICD-10-CM

## 2021-07-02 DIAGNOSIS — J329 Chronic sinusitis, unspecified: Secondary | ICD-10-CM

## 2021-07-02 MED ORDER — ALBUTEROL SULFATE HFA 108 (90 BASE) MCG/ACT IN AERS: 1.0000 | INHALATION_SPRAY | Freq: Four times a day (QID) | RESPIRATORY_TRACT | 0 refills | Status: AC | PRN

## 2021-07-02 MED ORDER — DULERA 100-5 MCG/ACT IN AERO: 2.0000 | INHALATION_SPRAY | Freq: Two times a day (BID) | RESPIRATORY_TRACT | 5 refills | Status: DC

## 2021-07-02 MED ORDER — CITALOPRAM HYDROBROMIDE 10 MG PO TABS: 10.0000 mg | ORAL_TABLET | Freq: Every day | ORAL | 3 refills | Status: DC

## 2021-07-02 MED ORDER — MONTELUKAST SODIUM 10 MG PO TABS: 10.0000 mg | ORAL_TABLET | Freq: Every day | ORAL | 3 refills | Status: DC

## 2021-07-02 MED ORDER — BUTALBITAL-APAP-CAFFEINE 50-325-40 MG PO TABS: 1.0000 | ORAL_TABLET | Freq: Four times a day (QID) | ORAL | 0 refills | Status: AC | PRN

## 2021-07-02 NOTE — Assessment & Plan Note (Signed)
Repeat lipid panel ?

## 2021-07-02 NOTE — Assessment & Plan Note (Addendum)
Stable montelukast, dulera and albuterol ?Refills sent ?

## 2021-07-02 NOTE — Patient Instructions (Addendum)
Schedule lab appt for fasting blood draw. ?Need to be fasting 8hrs prior to blood draw ? ?Sign medical release to get colonoscopy report from [facility]. ?

## 2021-07-02 NOTE — Assessment & Plan Note (Signed)
resolved 

## 2021-07-02 NOTE — Assessment & Plan Note (Signed)
Improved and stable mood with citalopram ?Denies any SI/HI/hallucinations ?Did not schedule appt with therapist due to lack of insurance. ? ?Maintain med dose ?F/up in 42months ?

## 2021-07-02 NOTE — Assessment & Plan Note (Addendum)
Chronic, intermittent, persistent in last 36month, located in frontal and behind eyes, no nausea, no dizziness. Some improvement with cold compress and acetaminophen/ibuprofen ?Reports difficulty with near vision, last eye exam >58yrs ago. ?Hx of chronic sinusitis, last ENT eval 54yrs ago. ? ?Normal neuro exam ?Sent fioricet ?Order head CT if no improvement. ?

## 2021-07-02 NOTE — Assessment & Plan Note (Signed)
Repeat hgbA1c 

## 2021-07-02 NOTE — Progress Notes (Signed)
? ?Subjective:  ?Patient ID: Christina Huerta, female    DOB: [birth year]  Age: 61 y.o. MRN: [id number] ? ?CC: Follow-up (F/u on HTN, anxiety, and headaches.  ) ? ?HPI ? ?Suicidal ideations ?resolved ? ?Other headache syndrome ?Chronic, intermittent, persistent in last 16month, located in frontal and behind eyes, no nausea, no dizziness. Some improvement with cold compress and acetaminophen/ibuprofen ?Reports difficulty with near vision, last eye exam >44yrs ago. ?Hx of chronic sinusitis, last ENT eval 57yrs ago. ? ?Normal neuro exam ?Sent fioricet ?Order head CT if no improvement. ? ?Asthma ?Stable montelukast, dulera and albuterol ?Refills sent ? ?Depression, recurrent ([place]) ?Improved and stable mood with citalopram ?Denies any SI/HI/hallucinations ?Did not schedule appt with therapist due to lack of insurance. ? ?Maintain med dose ?F/up in 40months ? ?Hyperglycemia ?Repeat hgbA1c ? ?Hypertriglyceridemia ?Repeat lipid panel ? ?Reviewed past Medical, Social and Family history today. ? ?Outpatient Medications Prior to Visit  ?Medication Sig Dispense Refill  ? Black Cohosh 80 MG CAPS Take 80 mg by mouth 1 day or 1 dose.    ? Evening Primrose Oil 500 MG CAPS Take by mouth.    ? Multiple Vitamins-Minerals (MULTIVITAMIN ADULT PO) Take by mouth.    ? Omega-3 Fatty Acids (OMEGA-3 FISH OIL PO) Take by mouth.    ? OVER THE COUNTER MEDICATION Vinegar Tab    ? tiZANidine (ZANAFLEX) 4 MG tablet Take by mouth.    ? VITAMIN E PO Take by mouth.    ? albuterol (PROVENTIL HFA;VENTOLIN HFA) 108 (90 Base) MCG/ACT inhaler Inhale 1-2 puffs into the lungs every 6 (six) hours as needed. 1 Inhaler 3  ? citalopram (CELEXA) 10 MG tablet Take 1 tablet (10 mg total) by mouth daily. 30 tablet 0  ? mometasone-formoterol (DULERA) 100-5 MCG/ACT AERO Inhale 2 puffs into the lungs 2 (two) times daily at 10 AM and 5 PM. 8.8 g 11  ? montelukast (SINGULAIR) 10 MG tablet Take 1 tablet (10 mg total) by mouth at bedtime. 90 tablet 3  ? ?No facility-administered  medications prior to visit.  ? ? ?ROS ?See HPI ? ?Objective:  ?BP 134/80 (BP Location: Left Arm, Patient Position: Sitting, Cuff Size: Large)   Pulse 88   Temp 98.1 ?F (36.7 ?C) (Temporal)   Ht 5\' 9"  (1.753 m)   Wt 246 lb 6.4 oz (111.8 kg)   SpO2 97%   BMI 36.39 kg/m?  ? ?Physical Exam ?Cardiovascular:  ?   Rate and Rhythm: Normal rate and regular rhythm.  ?   Pulses: Normal pulses.  ?   Heart sounds: Normal heart sounds.  ?Pulmonary:  ?   Effort: Pulmonary effort is normal.  ?   Breath sounds: Normal breath sounds.  ?Musculoskeletal:  ?   Right lower leg: No edema.  ?   Left lower leg: No edema.  ?Neurological:  ?   Mental Status: She is alert and oriented to person, place, and time.  ?Psychiatric:     ?   Mood and Affect: Mood normal.     ?   Thought Content: Thought content normal.  ? ?Assessment & Plan:  ?This visit occurred during the SARS-CoV-2 public health emergency.  Safety protocols were in place, including screening questions prior to the visit, additional usage of staff PPE, and extensive cleaning of exam room while observing appropriate contact time as indicated for disinfecting solutions.  ? ?Takari was seen today for follow-up. ? ?Diagnoses and all orders for this visit: ? ?Mild intermittent asthma without complication ?-  albuterol (VENTOLIN HFA) 108 (90 Base) MCG/ACT inhaler; Inhale 1-2 puffs into the lungs every 6 (six) hours as needed. ?-     mometasone-formoterol (DULERA) 100-5 MCG/ACT AERO; Inhale 2 puffs into the lungs 2 (two) times daily at 10 AM and 5 PM. ? ?Chronic recurrent sinusitis ?-     montelukast (SINGULAIR) 10 MG tablet; Take 1 tablet (10 mg total) by mouth at bedtime. ? ?Seasonal allergic rhinitis due to pollen ?-     montelukast (SINGULAIR) 10 MG tablet; Take 1 tablet (10 mg total) by mouth at bedtime. ? ?Hyperglycemia ?-     Hemoglobin A1c; Future ?-     Comprehensive metabolic panel; Future ? ?Hypertriglyceridemia ?-     Comprehensive metabolic panel; Future ?-     Lipid  panel; Future ? ?Depression, recurrent ([place]) ?-     citalopram (CELEXA) 10 MG tablet; Take 1 tablet (10 mg total) by mouth daily. ?-     T4, free; Future ?-     TSH; Future ?-     Comprehensive metabolic panel; Future ? ?Other headache syndrome ?-     butalbital-acetaminophen-caffeine (FIORICET) 50-325-40 MG tablet; Take 1 tablet by mouth every 6 (six) hours as needed for headache. ? ? ? ?Problem List Items Addressed This Visit   ? ?  ? Respiratory  ? Allergic rhinitis  ? Relevant Medications  ? montelukast (SINGULAIR) 10 MG tablet  ? Asthma - Primary  ?  Stable montelukast, dulera and albuterol ?Refills sent ?  ?  ? Relevant Medications  ? montelukast (SINGULAIR) 10 MG tablet  ? albuterol (VENTOLIN HFA) 108 (90 Base) MCG/ACT inhaler  ? mometasone-formoterol (DULERA) 100-5 MCG/ACT AERO  ? Chronic recurrent sinusitis  ? Relevant Medications  ? montelukast (SINGULAIR) 10 MG tablet  ?  ? Other  ? Depression, recurrent ([place])  ?  Improved and stable mood with citalopram ?Denies any SI/HI/hallucinations ?Did not schedule appt with therapist due to lack of insurance. ? ?Maintain med dose ?F/up in 47months ?  ?  ? Relevant Medications  ? citalopram (CELEXA) 10 MG tablet  ? Other Relevant Orders  ? T4, free  ? TSH  ? Comprehensive metabolic panel  ? Hyperglycemia  ?  Repeat hgbA1c ?  ?  ? Relevant Orders  ? Hemoglobin A1c  ? Comprehensive metabolic panel  ? Hypertriglyceridemia  ?  Repeat lipid panel ?  ?  ? Relevant Orders  ? Comprehensive metabolic panel  ? Lipid panel  ? Other headache syndrome  ?  Chronic, intermittent, persistent in last 38month, located in frontal and behind eyes, no nausea, no dizziness. Some improvement with cold compress and acetaminophen/ibuprofen ?Reports difficulty with near vision, last eye exam >105yrs ago. ?Hx of chronic sinusitis, last ENT eval 66yrs ago. ? ?Normal neuro exam ?Sent fioricet ?Order head CT if no improvement. ?  ?  ? Relevant Medications  ? citalopram (CELEXA) 10 MG tablet  ?  butalbital-acetaminophen-caffeine (FIORICET) 50-325-40 MG tablet  ?  ?Follow-up: Return in about 3 months (around 10/02/2021) for CPE (fasting, breast and pelvic exam). ? ?Wilfred Lacy, NP ?

## 2021-08-13 ENCOUNTER — Telehealth: Payer: Self-pay | Admitting: Orthopaedic Surgery

## 2021-08-13 NOTE — Telephone Encounter (Signed)
Called pt to sch app with Roda Shutters for an office visit  per my chart message ? ?Appointment Request From: [name] ?With Provider: Glee Arvin, MD [facility] ] ?Preferred Date Range: 08/30/2021 - 09/03/2021 ?Preferred Times: Monday Afternoon, Tuesday Afternoon, Wednesday Afternoon, Thursday Afternoon ?Reason for visit: Office Visit ?Comments: ?Follow up  ?

## 2021-08-24 ENCOUNTER — Encounter: Payer: Self-pay | Admitting: Nurse Practitioner

## 2021-08-24 DIAGNOSIS — F339 Major depressive disorder, recurrent, unspecified: Secondary | ICD-10-CM

## 2021-08-24 MED ORDER — CITALOPRAM HYDROBROMIDE 20 MG PO TABS: 20.0000 mg | ORAL_TABLET | Freq: Every day | ORAL | 3 refills | Status: DC

## 2021-08-27 ENCOUNTER — Other Ambulatory Visit (INDEPENDENT_AMBULATORY_CARE_PROVIDER_SITE_OTHER): Payer: Self-pay

## 2021-08-27 DIAGNOSIS — E781 Pure hyperglyceridemia: Secondary | ICD-10-CM

## 2021-08-27 DIAGNOSIS — R739 Hyperglycemia, unspecified: Secondary | ICD-10-CM

## 2021-08-27 DIAGNOSIS — F339 Major depressive disorder, recurrent, unspecified: Secondary | ICD-10-CM

## 2021-08-27 LAB — COMPREHENSIVE METABOLIC PANEL
ALT: 33 U/L (ref 0–35)
AST: 22 U/L (ref 0–37)
Albumin: 4.6 g/dL (ref 3.5–5.2)
Alkaline Phosphatase: 72 U/L (ref 39–117)
BUN: 14 mg/dL (ref 6–23)
CO2: 30 mEq/L (ref 19–32)
Calcium: 9.8 mg/dL (ref 8.4–10.5)
Chloride: 101 mEq/L (ref 96–112)
Creatinine, Ser: 0.63 mg/dL (ref 0.40–1.20)
GFR: 97.61 mL/min (ref >60.00)
Glucose, Bld: 109 mg/dL — ABNORMAL HIGH (ref 70–99)
Potassium: 4.4 mEq/L (ref 3.5–5.1)
Sodium: 138 mEq/L (ref 135–145)
Total Bilirubin: 0.7 mg/dL (ref 0.2–1.2)
Total Protein: 7.3 g/dL (ref 6.0–8.3)

## 2021-08-27 LAB — LIPID PANEL
Cholesterol: 292 mg/dL — ABNORMAL HIGH (ref 0–200)
HDL: 50.4 mg/dL (ref >39.00)
NonHDL: 241.2
Total CHOL/HDL Ratio: 6
Triglycerides: 330 mg/dL — ABNORMAL HIGH (ref 0.0–149.0)
VLDL: 66 mg/dL — ABNORMAL HIGH (ref 0.0–40.0)

## 2021-08-27 LAB — T4, FREE: Free T4: 0.64 ng/dL (ref 0.60–1.60)

## 2021-08-27 LAB — HEMOGLOBIN A1C: Hgb A1c MFr Bld: 5.6 % (ref 4.6–6.5)

## 2021-08-27 LAB — LDL CHOLESTEROL, DIRECT: Direct LDL: 184 mg/dL

## 2021-08-27 LAB — TSH: TSH: 2.54 u[IU]/mL (ref 0.35–5.50)

## 2021-08-27 MED ORDER — FENOFIBRATE 145 MG PO TABS: 145.0000 mg | ORAL_TABLET | Freq: Every day | ORAL | 1 refills | Status: DC

## 2021-10-18 ENCOUNTER — Telehealth: Payer: Self-pay | Admitting: Nurse Practitioner

## 2021-10-18 NOTE — Telephone Encounter (Signed)
Pt stated she is in [place] for 4 weeks for physical therapy and pt stated ear is slightly irritated on left side ear on . Pt wanted to know if you can call her some medication and send it to: [facility] in [place] [address] [place], [place] [address]  ~67.2 mi [phone]... pt stated lvm on her phone please if she do not answer

## 2021-10-19 NOTE — Telephone Encounter (Signed)
Call pt lvm not able to prescribe any medication unless seen by a provider advising her to call and schedule a virtual visit with her provider or visit the local urgent care.

## 2021-12-08 ENCOUNTER — Encounter: Payer: Self-pay | Admitting: Nurse Practitioner

## 2021-12-08 ENCOUNTER — Other Ambulatory Visit (INDEPENDENT_AMBULATORY_CARE_PROVIDER_SITE_OTHER): Payer: Self-pay

## 2021-12-08 ENCOUNTER — Ambulatory Visit (INDEPENDENT_AMBULATORY_CARE_PROVIDER_SITE_OTHER): Payer: Self-pay | Admitting: Nurse Practitioner

## 2021-12-08 VITALS — BP 140/80 | HR 80 | Wt 230.2 lb

## 2021-12-08 DIAGNOSIS — L539 Erythematous condition, unspecified: Secondary | ICD-10-CM

## 2021-12-08 DIAGNOSIS — E781 Pure hyperglyceridemia: Secondary | ICD-10-CM

## 2021-12-08 LAB — LIPID PANEL
Cholesterol: 294 mg/dL — ABNORMAL HIGH (ref 0–200)
HDL: 45.5 mg/dL (ref >39.00)
Total CHOL/HDL Ratio: 6
Triglycerides: 486 mg/dL — ABNORMAL HIGH (ref 0.0–149.0)

## 2021-12-08 LAB — LDL CHOLESTEROL, DIRECT: Direct LDL: 153 mg/dL

## 2021-12-08 MED ORDER — TRIAMCINOLONE ACETONIDE 0.1 % EX CREA: 1.0000 | TOPICAL_CREAM | Freq: Two times a day (BID) | CUTANEOUS | 0 refills | Status: DC

## 2021-12-08 MED ORDER — MUPIROCIN 2 % EX OINT: 1.0000 | TOPICAL_OINTMENT | Freq: Two times a day (BID) | CUTANEOUS | 0 refills | Status: DC

## 2021-12-08 NOTE — Progress Notes (Signed)
   Acute Office Visit  Subjective:     Patient ID: Christina Huerta, female    DOB: 1960/11/08, 61 y.o.   MRN: [id number]  Chief Complaint  Patient presents with   Insect Bite    Pt c/o red bump w/ swelling, throbbing-like pain, and warm to touch at site due to possible insect bite/sting     HPI Patient is in today for a red bump with swelling, throbbing, itching pain that started 2 days ago.  She states that she was at a store and noticed a bug on her, she flicked it off and felt a sting.  She looked down at her left arm and noted that the stinger was still in place.  She flicked this off and noticed that the area started turning red and swelling.  Over the past 2 days, she noticed that the area of redness has spread and there might be some pus at the original site.  She has been using Benadryl and a peppermint spray.  She is concerned that the stinger might be partially left and that it may be infected.  She denies fevers  ROS See pertinent positives and negatives per HPI.     Objective:    BP (!) 140/80   Pulse 80   Wt 230 lb 3.2 oz (104.4 kg)   SpO2 94%   BMI 33.99 kg/m    Physical Exam Constitutional:      General: She is not in acute distress.    Appearance: Normal appearance.  Pulmonary:     Effort: Pulmonary effort is normal.  Skin:    Findings: Erythema present.     Comments: Area of redness to left forearm. No drainage  Neurological:     Mental Status: She is alert.       Assessment & Plan:   Problem List Items Addressed This Visit   None Visit Diagnoses     Redness of skin    -  Primary   No signs of infection or stinger on left forearm. Wash area BID with warm soap water. Use triamcinolone cream prn. F/U if not improving       Meds ordered this encounter  Medications   DISCONTD: mupirocin ointment (BACTROBAN) 2 %    Sig: Apply 1 Application topically 2 (two) times daily.    Dispense:  22 g    Refill:  0   triamcinolone cream (KENALOG) 0.1 %     Sig: Apply 1 Application topically 2 (two) times daily.    Dispense:  30 g    Refill:  0    Return if symptoms worsen or fail to improve.  Gerre Scull, NP

## 2021-12-08 NOTE — Patient Instructions (Addendum)
It was great to see you!  You can continue benadryl, peppermint spray, and ice as needed for the sting. You can also start triamcinolone cream twice a day. Make sure you are washing the area twice a day with soap and water.   Let's follow-up if your symptoms worsen or don't improve   Take care,  Rodman Pickle, NP

## 2022-02-12 DIAGNOSIS — Z1231 Encounter for screening mammogram for malignant neoplasm of breast: Secondary | ICD-10-CM | POA: Diagnosis not present

## 2022-02-12 LAB — HM MAMMOGRAPHY

## 2022-04-22 DIAGNOSIS — M48061 Spinal stenosis, lumbar region without neurogenic claudication: Secondary | ICD-10-CM | POA: Diagnosis not present

## 2022-04-22 DIAGNOSIS — M5126 Other intervertebral disc displacement, lumbar region: Secondary | ICD-10-CM | POA: Diagnosis not present

## 2022-04-22 DIAGNOSIS — R200 Anesthesia of skin: Secondary | ICD-10-CM | POA: Diagnosis not present

## 2022-04-22 DIAGNOSIS — S39012A Strain of muscle, fascia and tendon of lower back, initial encounter: Secondary | ICD-10-CM | POA: Diagnosis not present

## 2022-04-22 DIAGNOSIS — M47816 Spondylosis without myelopathy or radiculopathy, lumbar region: Secondary | ICD-10-CM | POA: Diagnosis not present

## 2022-07-12 ENCOUNTER — Ambulatory Visit: Payer: BC Managed Care – PPO | Admitting: Family Medicine

## 2022-07-12 ENCOUNTER — Encounter: Payer: Self-pay | Admitting: Family Medicine

## 2022-07-12 VITALS — BP 138/80 | HR 97 | Temp 98.7°F | Ht 69.0 in | Wt 236.0 lb

## 2022-07-12 DIAGNOSIS — M25562 Pain in left knee: Secondary | ICD-10-CM

## 2022-07-12 DIAGNOSIS — G8929 Other chronic pain: Secondary | ICD-10-CM | POA: Insufficient documentation

## 2022-07-12 MED ORDER — NAPROXEN 500 MG PO TABS: 500.0000 mg | ORAL_TABLET | Freq: Two times a day (BID) | ORAL | 0 refills | Status: DC

## 2022-07-12 NOTE — Assessment & Plan Note (Signed)
Christina Huerta is having a flare of left knee pain. I recommend relative rest, ice, and I will prescribe a 7-day course of naproxen 500 mg bid. Follow-up if not improving.

## 2022-07-12 NOTE — Progress Notes (Signed)
[facility] [address] [place] [address] Dept: [phone] Dept Fax: [phone]  Office Visit  Subjective:    Patient ID: Christina Huerta, female    DOB: [birth year], 62 y.o..   MRN: [id number]  Chief Complaint  Patient presents with   Knee Pain    C/o having LT knee pain x 2 days.  Has used elevation, ice packs, and taken Tylenol and Ibuprofen.    History of Present Illness:  Patient is in today with a 2-day history of a flare of left knee pain. She notes that her pain seems to come through from the popliteal fossa and feels like a knife pushing out her knee cap. She did some increased walking the day this occurred. She had gotten a new bicycle for her granddaughter and they took her to the park. She notes she walked over 2,000 steps while there. She didn't think much about this, as she had been doing increased walking over the past few months as part of PT. She has backed off on her home exercises the past few days. She did use some ibuprofen, but was worried about using too much. She has also put ice to this.   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   Meniscal injury, right, sequela 03/28/2017   Hypertriglyceridemia 03/03/2017   Right knee pain 12/16/2016   Fracture of radial neck, right, closed 11/23/2016   Hyperglycemia 07/26/2016   Depression, recurrent ([place]) 07/26/2016   Hx of rheumatoid arthritis 07/25/2016   Allergic rhinitis 07/25/2016   Past Surgical History:  Procedure Laterality Date    CESAREAN SECTION  1996   scar neunoma surgery  1998   Family History  Problem Relation Age of Onset   Hyperlipidemia Mother    Hyperlipidemia Father    Heart disease Father    Hypertension Father    Kidney disease Father    Arthritis Maternal Grandmother    Hyperlipidemia Maternal Grandmother    Cancer Maternal Grandfather        prostate cancer   Hyperlipidemia Maternal Grandfather    Arthritis Paternal Grandmother    Hyperlipidemia Paternal Grandmother    Cancer Paternal Grandfather        colon cancer   Hyperlipidemia Paternal Grandfather    Heart disease Paternal Grandfather    Stroke Paternal Grandfather    Hypertension Paternal Grandfather    Outpatient Medications Prior to Visit  Medication Sig Dispense Refill   albuterol (VENTOLIN HFA) 108 (90 Base) MCG/ACT inhaler Inhale 1-2 puffs into the lungs every 6 (six) hours as needed. 1 each 0   citalopram (CELEXA) 20 MG tablet Take 1 tablet (20 mg total) by mouth daily. 90 tablet 3   Evening Primrose Oil 500 MG CAPS Take by mouth.     fenofibrate (TRICOR) 145 MG tablet Take 1 tablet (145 mg total) by mouth daily. 90 tablet 1   mometasone-formoterol (DULERA) 100-5 MCG/ACT AERO Inhale 2 puffs into the lungs 2 (two) times daily at 10 AM and 5 PM. 8.8 g 5   montelukast (SINGULAIR) 10 MG tablet Take 1 tablet (  10 mg total) by mouth at bedtime. 90 tablet 3   Multiple Vitamins-Minerals (MULTIVITAMIN ADULT PO) Take by mouth.     OVER THE COUNTER MEDICATION Vinegar Tab     tiZANidine (ZANAFLEX) 4 MG tablet Take by mouth.     triamcinolone cream (KENALOG) 0.1 % Apply 1 Application topically 2 (two) times daily. 30 g 0   VITAMIN E PO Take by mouth.     No facility-administered medications prior to visit.   Allergies  Allergen Reactions   Latex Other (See Comments) and Rash    Break out "water welts"   Sumatriptan Other (See Comments) and Rash    Face swelling,break out,red.  Only IM   Astemizole Nausea Only   Oxycodone Rash      Objective:   Today's Vitals   07/12/22 1102  BP: 138/80  Pulse: 97  Temp: 98.7 F (37.1 C)  TempSrc: Temporal  SpO2: 98%  Weight: 236 lb (107 kg)  Height: '5\' 9"'$  (1.753 m)   Body mass index is 34.85 kg/m.   General: Well developed, well nourished. No acute distress. Extremities: The left knee shows no increased warmth and now swelling, though there is some bony   hypertrophy. Full ROM. There is no crepitance. Varus/valgus and Lachman's testing are limited by pain in   the popliteal area with gripping. However, there is no discernible ligamentous laxity. There is some   popping over the lateral joint line and the medial aspect of the patella with McMurray's testing. This also   created increased pain. Psych: Alert and oriented. Normal mood and affect.  Health Maintenance Due  Topic Date Due   HIV Screening  Never done   DTaP/Tdap/Td (1 - Tdap) Never done   Zoster Vaccines- Shingrix (1 of 2) Never done   PAP SMEAR-Modifier  Never done   COLONOSCOPY (Pts 45-75yr Insurance coverage will need to be confirmed)  Never done   INFLUENZA VACCINE  Never done      Assessment & Plan:   Problem List Items Addressed This Visit       Other   Acute pain of left knee - Primary    Ms. TKovalevis having a flare of left knee pain. I recommend relative rest, ice, and I will prescribe a 7-day course of naproxen 500 mg bid. Follow-up if not improving.       Relevant Medications   naproxen (NAPROSYN) 500 MG tablet    Return in about 1 week (around 07/19/2022), or if symptoms worsen or fail to improve.   SHaydee Salter MD

## 2022-09-15 ENCOUNTER — Ambulatory Visit (INDEPENDENT_AMBULATORY_CARE_PROVIDER_SITE_OTHER)
Admission: RE | Admit: 2022-09-15 | Discharge: 2022-09-15 | Disposition: A | Discharge status: Home / Self Care | Payer: BC Managed Care – PPO | Source: Ambulatory Visit | Attending: Nurse Practitioner | Admitting: Nurse Practitioner

## 2022-09-15 ENCOUNTER — Ambulatory Visit: Payer: BC Managed Care – PPO | Admitting: Nurse Practitioner

## 2022-09-15 ENCOUNTER — Encounter: Payer: Self-pay | Admitting: Nurse Practitioner

## 2022-09-15 VITALS — BP 130/80 | HR 90 | Temp 98.4°F | Resp 16 | Ht 69.0 in | Wt 232.0 lb

## 2022-09-15 DIAGNOSIS — G8929 Other chronic pain: Secondary | ICD-10-CM

## 2022-09-15 DIAGNOSIS — J4520 Mild intermittent asthma, uncomplicated: Secondary | ICD-10-CM | POA: Diagnosis not present

## 2022-09-15 DIAGNOSIS — F339 Major depressive disorder, recurrent, unspecified: Secondary | ICD-10-CM

## 2022-09-15 DIAGNOSIS — M25552 Pain in left hip: Secondary | ICD-10-CM

## 2022-09-15 DIAGNOSIS — M5442 Lumbago with sciatica, left side: Secondary | ICD-10-CM

## 2022-09-15 DIAGNOSIS — M47816 Spondylosis without myelopathy or radiculopathy, lumbar region: Secondary | ICD-10-CM | POA: Diagnosis not present

## 2022-09-15 MED ORDER — DULERA 100-5 MCG/ACT IN AERO
2.0000 | INHALATION_SPRAY | Freq: Two times a day (BID) | RESPIRATORY_TRACT | 5 refills | Status: DC
Start: 2022-09-15 — End: 2023-03-22

## 2022-09-15 MED ORDER — PREDNISONE 10 MG (21) PO TBPK: ORAL_TABLET | ORAL | 0 refills | Status: DC

## 2022-09-15 MED ORDER — CYCLOBENZAPRINE HCL 10 MG PO TABS: 10.0000 mg | ORAL_TABLET | Freq: Every day | ORAL | 0 refills | Status: DC

## 2022-09-15 MED ORDER — CITALOPRAM HYDROBROMIDE 20 MG PO TABS: 20.0000 mg | ORAL_TABLET | Freq: Every day | ORAL | 3 refills | Status: DC

## 2022-09-15 NOTE — Assessment & Plan Note (Addendum)
Acute on chronic back pain radiating to left buttocks and posterior thigh. Associated with left posterior knee pain and left leg weakness. Pain is worse with prolong sitting and walking on an incline. No improvement with naproxen, zanaflex and home back exercises. Symptoms are worse in last 1week. Denies any injury, no saddle paresthesia, no change in GI and GU functions. Previously under the care of [facility] ortho, NSAIDs and PT recommended. Reports some improvement after completion of PT at the time. X-ray lumbar spine per [facility] 06/2020: 2 views of the lumbar spine were performed and reviewed in the office today, these showed L5-S1 degenerative disc disease, with some spondylosis.  X-ray of left knee 05/2020: normal MRI lumbar spine 09/2020 by [facility]: Mild degenerative disc disease and facet arthrosis as described above. Mild Modic type II changes at L4-L5 and minimally at L5-S1.  MRI sacrum 05/2020 by [facility]: normal  Repeat hip and lumber spine x-ray Start oral prednisone and switch zanaflex to flexeril Advised to also take tylenol 650mg  [id number]. Ref to PT is no acute findings on x-ray F/up in 62month

## 2022-09-15 NOTE — Patient Instructions (Addendum)
Go to [address] for x-[address]. Start prednisone and flexeril Also take tylenol 650mg  every 8hrs as needed for pain.

## 2022-09-15 NOTE — Assessment & Plan Note (Signed)
Stable mood with celexa Refill sent

## 2022-09-15 NOTE — Progress Notes (Signed)
Established Patient Visit  Patient: Christina Huerta   DOB: [birth year]   62 y.o. Female  MRN: [id number] Visit Date: 09/15/2022  Subjective:    Chief Complaint  Patient presents with   Leg Pain    Sharp pain at the buttock and back of knee    HPI Chronic left-sided low back pain with left-sided sciatica Acute on chronic back pain radiating to left buttocks and posterior thigh. Associated with left posterior knee pain and left leg weakness. Pain is worse with prolong sitting and walking on an incline. No improvement with naproxen, zanaflex and home back exercises. Symptoms are worse in last 1week. Denies any injury, no saddle paresthesia, no change in GI and GU functions. Previously under the care of [facility] ortho, NSAIDs and PT recommended. Reports some improvement after completion of PT at the time. X-ray lumbar spine per [facility] 06/2020: 2 views of the lumbar spine were performed and reviewed in the office today, these showed L5-S1 degenerative disc disease, with some spondylosis.  X-ray of left knee 05/2020: normal MRI lumbar spine 09/2020 by [facility]: Mild degenerative disc disease and facet arthrosis as described above. Mild Modic type II changes at L4-L5 and minimally at L5-S1.  MRI sacrum 05/2020 by [facility]: normal  Repeat hip and lumber spine x-ray Start oral prednisone and switch zanaflex to flexeril Advised to also take tylenol 650mg  q8hrsprn. Ref to PT is no acute findings on x-ray F/up in 59month   Depression, recurrent (HCC) Stable mood with celexa Refill sent  Asthma No need for SABA Maintain use of montelukast and dulera Refill sent Left knee pain x 2week ago No injury No improvement with taping joint, elevation and message., cold compress.  Reviewed medical, surgical, and social history today  Medications: Outpatient Medications Prior to Visit  Medication Sig   gabapentin (NEURONTIN) 100 MG capsule Take 100 mg by mouth 3  (three) times daily.   albuterol (VENTOLIN HFA) 108 (90 Base) MCG/ACT inhaler Inhale 1-2 puffs into the lungs every 6 (six) hours as needed.   Evening Primrose Oil 500 MG CAPS Take by mouth.   fenofibrate (TRICOR) 145 MG tablet Take 1 tablet (145 mg total) by mouth daily.   montelukast (SINGULAIR) 10 MG tablet Take 1 tablet (10 mg total) by mouth at bedtime.   Multiple Vitamins-Minerals (MULTIVITAMIN ADULT PO) Take by mouth.   OVER THE COUNTER MEDICATION Vinegar Tab   triamcinolone cream (KENALOG) 0.1 % Apply 1 Application topically 2 (two) times daily.   VITAMIN E PO Take by mouth.   [DISCONTINUED] citalopram (CELEXA) 20 MG tablet Take 1 tablet (20 mg total) by mouth daily.   [DISCONTINUED] mometasone-formoterol (DULERA) 100-5 MCG/ACT AERO Inhale 2 puffs into the lungs 2 (two) times daily at 10 AM and 5 PM.   [DISCONTINUED] naproxen (NAPROSYN) 500 MG tablet Take 1 tablet (500 mg total) by mouth 2 (two) times daily with a meal.   [DISCONTINUED] tiZANidine (ZANAFLEX) 4 MG tablet Take by mouth.   No facility-administered medications prior to visit.   Reviewed past medical and social history.   ROS per HPI above      Objective:  BP 130/80 (BP Location: Left Arm, Patient Position: Sitting, Cuff Size: Large)   Pulse 90   Temp 98.4 F (36.9 C) (Temporal)   Resp 16   Ht 5\' 9"  (1.753 m)   Wt 232 lb (105.2 kg)   SpO2 99%  BMI 34.26 kg/m      Physical Exam Vitals reviewed.  Cardiovascular:     Rate and Rhythm: Normal rate.     Pulses: Normal pulses.  Pulmonary:     Effort: Pulmonary effort is normal.  Abdominal:     General: There is no distension.     Palpations: Abdomen is soft.     Tenderness: There is no abdominal tenderness. There is no right CVA tenderness, left CVA tenderness or guarding.  Musculoskeletal:     Lumbar back: Tenderness present. No spasms or bony tenderness. Normal range of motion. Negative right straight leg raise test and negative left straight leg raise  test. No scoliosis.     Right hip: Normal.     Left hip: Tenderness and crepitus present. No deformity, lacerations or bony tenderness. Normal range of motion. Decreased strength.     Right upper leg: Normal.     Left upper leg: Normal.     Right knee: Normal.     Left knee: Normal.     Right lower leg: Normal.     Left lower leg: Normal.     No results found for any visits on 09/15/22.    Assessment & Plan:    Problem List Items Addressed This Visit       Respiratory   Asthma    No need for SABA Maintain use of montelukast and dulera Refill sent      Relevant Medications   predniSONE (STERAPRED UNI-PAK 21 TAB) 10 MG (21) TBPK tablet   mometasone-formoterol (DULERA) 100-5 MCG/ACT AERO     Nervous and Auditory   Chronic left-sided low back pain with left-sided sciatica - Primary    Acute on chronic back pain radiating to left buttocks and posterior thigh. Associated with left posterior knee pain and left leg weakness. Pain is worse with prolong sitting and walking on an incline. No improvement with naproxen, zanaflex and home back exercises. Symptoms are worse in last 1week. Denies any injury, no saddle paresthesia, no change in GI and GU functions. Previously under the care of [facility], NSAIDs and PT recommended. Reports some improvement after completion of PT at the time. X-ray lumbar spine per [facility] 06/2020: 2 views of the lumbar spine were performed and reviewed in the office today, these showed L5-S1 degenerative disc disease, with some spondylosis.  X-ray of left knee 05/2020: normal MRI lumbar spine 09/2020 by [facility]: Mild degenerative disc disease and facet arthrosis as described above. Mild Modic type II changes at L4-L5 and minimally at L5-S1.  MRI sacrum 05/2020 by [facility]: normal  Repeat hip and lumber spine x-ray Start oral prednisone and switch zanaflex to flexeril Advised to also take tylenol 650mg  q8hrsprn. Ref to PT is no  acute findings on x-ray F/up in 78month       Relevant Medications   gabapentin (NEURONTIN) 100 MG capsule   predniSONE (STERAPRED UNI-PAK 21 TAB) 10 MG (21) TBPK tablet   cyclobenzaprine (FLEXERIL) 10 MG tablet   citalopram (CELEXA) 20 MG tablet   Other Relevant Orders   DG Lumbar Spine Complete     Other   Acute hip pain, left   Relevant Medications   predniSONE (STERAPRED UNI-PAK 21 TAB) 10 MG (21) TBPK tablet   cyclobenzaprine (FLEXERIL) 10 MG tablet   Other Relevant Orders   DG HIP UNILAT W OR W/O PELVIS 2-3 VIEWS LEFT   Depression, recurrent (HCC)    Stable mood with celexa Refill sent  Relevant Medications   citalopram (CELEXA) 20 MG tablet   Return in about 4 weeks (around 10/13/2022) for CPE (fasting).     [name], NP

## 2022-09-15 NOTE — Assessment & Plan Note (Signed)
No need for SABA Maintain use of montelukast and dulera Refill sent

## 2022-09-19 ENCOUNTER — Ambulatory Visit: Payer: BC Managed Care – PPO | Admitting: Nurse Practitioner

## 2022-10-24 ENCOUNTER — Ambulatory Visit: Payer: BC Managed Care – PPO | Admitting: Nurse Practitioner

## 2022-10-24 ENCOUNTER — Encounter: Payer: Self-pay | Admitting: Nurse Practitioner

## 2022-10-24 VITALS — BP 120/80 | HR 93 | Temp 97.0°F | Resp 16 | Wt 232.2 lb

## 2022-10-24 DIAGNOSIS — G8929 Other chronic pain: Secondary | ICD-10-CM | POA: Diagnosis not present

## 2022-10-24 DIAGNOSIS — M25562 Pain in left knee: Secondary | ICD-10-CM | POA: Diagnosis not present

## 2022-10-24 MED ORDER — MELOXICAM 15 MG PO TABS
15.0000 mg | ORAL_TABLET | Freq: Every day | ORAL | 0 refills | Status: DC
Start: 2022-10-24 — End: 2023-03-22

## 2022-10-24 NOTE — Assessment & Plan Note (Addendum)
Onset 2021 after fall at [employer], pain is intermittent, worse in last 3days due to prolonged standing and walking. She denies any recent injury. She was previous evaluated by Dr. Roda Shutters and [facility]. Left Knee DG completed 2022: normal. Minimal relief with ibuprofen/tylenol 1-2tabs every 4hrs. Completed oral prednisone dose pack 37month ago  Sent mobic 15mg , advised to take with food, avoid other NSAIDs while taking mobic, use knee brace, and entered referral to ortho-Dr. Roda Shutters

## 2022-10-24 NOTE — Patient Instructions (Signed)
Schedule appointment for CPE Use nee sleeve daily Schedule appointment with ortho

## 2022-10-24 NOTE — Progress Notes (Signed)
Established Patient Visit  Patient: Christina Huerta   DOB: [birth year]   62 y.o. Female  MRN: [id number] Visit Date: 10/24/2022  Subjective:    Chief Complaint  Patient presents with   Edema    Left knee swelling and pain.  She has sharp pain in the back of the knee when going up and down stairs    HPI Chronic pain of left knee Onset 2021 after fall at convenient store, pain is intermittent, worse in last 3days due to prolonged standing and walking. She denies any recent injury. She was previous evaluated by Dr. Roda Shutters and [facility]. Left Knee DG completed 2022: normal. Minimal relief with ibuprofen/tylenol 1-2tabs every 4hrs. Completed oral prednisone dose pack 78month ago  Sent mobic 15mg , advised to take with food, avoid other NSAIDs while taking mobic, use knee brace, and entered referral to ortho-Dr. Roda Shutters  Reviewed medical, surgical, and social history today  Medications: Outpatient Medications Prior to Visit  Medication Sig   albuterol (VENTOLIN HFA) 108 (90 Base) MCG/ACT inhaler Inhale 1-2 puffs into the lungs every 6 (six) hours as needed.   citalopram (CELEXA) 20 MG tablet Take 1 tablet (20 mg total) by mouth daily.   cyclobenzaprine (FLEXERIL) 10 MG tablet Take 1 tablet (10 mg total) by mouth at bedtime.   Evening Primrose Oil 500 MG CAPS Take by mouth.   fenofibrate (TRICOR) 145 MG tablet Take 1 tablet (145 mg total) by mouth daily.   gabapentin (NEURONTIN) 100 MG capsule Take 100 mg by mouth 3 (three) times daily.   mometasone-formoterol (DULERA) 100-5 MCG/ACT AERO Inhale 2 puffs into the lungs 2 (two) times daily at 10 AM and 5 PM.   montelukast (SINGULAIR) 10 MG tablet Take 1 tablet (10 mg total) by mouth at bedtime.   Multiple Vitamins-Minerals (MULTIVITAMIN ADULT PO) Take by mouth.   OVER THE COUNTER MEDICATION Vinegar Tab   triamcinolone cream (KENALOG) 0.1 % Apply 1 Application topically 2 (two) times daily.   VITAMIN E PO Take by mouth.   [DISCONTINUED]  predniSONE (STERAPRED UNI-PAK 21 TAB) 10 MG (21) TBPK tablet As directed on package   No facility-administered medications prior to visit.   Reviewed past medical and social history.   ROS per HPI above      Objective:  BP 120/80   Pulse 93   Temp (!) 97 F (36.1 C) (Temporal)   Resp 16   Wt 232 lb 3.2 oz (105.3 kg)   SpO2 96%   BMI 34.29 kg/m      Physical Exam Vitals and nursing note reviewed.  Constitutional:      General: She is not in acute distress. Cardiovascular:     Pulses: Normal pulses.  Pulmonary:     Effort: Pulmonary effort is normal.  Musculoskeletal:     Left upper leg: Normal.     Right knee: Crepitus present. No swelling, effusion, erythema or bony tenderness. Normal range of motion. No tenderness. No patellar tendon tenderness. Normal alignment.     Left knee: Swelling and crepitus present. No effusion, erythema or bony tenderness. Normal range of motion. No tenderness. No patellar tendon tenderness. Normal alignment.     Right lower leg: Normal.     Left lower leg: Normal.     No results found for any visits on 10/24/22.    Assessment & Plan:    Problem List Items Addressed This Visit  Other   Chronic pain of left knee - Primary    Onset 2021 after fall at convenient store, pain is intermittent, worse in last 3days due to prolonged standing and walking. She denies any recent injury. She was previous evaluated by Dr. Roda [facility]ters [facility]. Left Knee DG completed 2022: normal. Minimal relief with ibuprofen/tylenol 1-2tabs every 4hrs. Completed oral prednisone dose pack 50month ago  Sent mobic 15mg , advised to take with food, avoid other NSAIDs while taking mobic, use knee brace, and entered referral to ortho-Dr. Roda Shutters      Relevant Medications   meloxicam (MOBIC) 15 MG tablet   Other Relevant Orders   Ambulatory referral to Orthopedics   Return for advised to schedule CPE appt.     [name], NP

## 2022-10-31 NOTE — Progress Notes (Unsigned)
Office Visit Note   Patient: Christina Huerta           Date of Birth: 1960/07/14           MRN: [id number] Visit Date: 11/01/2022              Requested by: Anne Ng, NP [address] [place],  [place] [address] PCP: Anne Ng, NP   Assessment & Plan: Visit Diagnoses:  1. Primary osteoarthritis of left knee     Plan: Cordelia Pen is a 62 year old female with chronic left knee pain characterized by occasional stabbing sensation in the back of the knee.  Etiology is unclear.  Possibly a symptomatic small Baker's cyst.  Possibly a degenerative medial meniscal tear.  Recommend steroid injection today and 6 weeks of activity modification.  If symptoms do not improve we will need to further evaluate with an MRI.  She tolerated the injection well today.  Follow-Up Instructions: No follow-ups on file.   Orders:  No orders of the defined types were placed in this encounter.  No orders of the defined types were placed in this encounter.     Procedures: Large Joint Inj: L knee on 11/01/2022 5:27 PM Details: 22 G needle Medications: 2 mL bupivacaine 0.5 %; 2 mL lidocaine 1 %; 40 mg methylPREDNISolone acetate 40 MG/ML Outcome: tolerated well, no immediate complications Patient was prepped and draped in the usual sterile fashion.       Clinical Data: No additional findings.   Subjective: No chief complaint on file.   HPI Christina Huerta is a 62 year old female here for evaluation of chronic left knee pain for 2 months.  Reports a stabbing sensation in the back of the knee occasionally with activity.  Denies any recent injuries or changes in activity.  Has been wearing a compression sleeve and using meloxicam that was prescribed by PCP.  Denies any mechanical symptoms.  Review of Systems  Constitutional: Negative.   HENT: Negative.    Eyes: Negative.   Respiratory: Negative.    Cardiovascular: Negative.   Endocrine: Negative.   Musculoskeletal: Negative.    Neurological: Negative.   Hematological: Negative.   Psychiatric/Behavioral: Negative.    All other systems reviewed and are negative.   Objective: Vital Signs: There were no vitals taken for this visit.  Physical Exam Vitals and nursing note reviewed.  Constitutional:      Appearance: She is well-developed.  HENT:     Head: Atraumatic.     Nose: Nose normal.  Eyes:     Extraocular Movements: Extraocular movements intact.  Cardiovascular:     Pulses: Normal pulses.  Pulmonary:     Effort: Pulmonary effort is normal.  Abdominal:     Palpations: Abdomen is soft.  Musculoskeletal:     Cervical back: Neck supple.  Skin:    General: Skin is warm.     Capillary Refill: Capillary refill takes less than 2 seconds.  Neurological:     Mental Status: She is alert. Mental status is at baseline.  Psychiatric:        Behavior: Behavior normal.        Thought Content: Thought content normal.        Judgment: Judgment normal.   Ortho Exam Examination of the left knee shows trace effusion and medial joint line tenderness.  No obvious palpable popliteal masses.  No neurovascular compromise distally. Specialty Comments:  No specialty comments available.  Imaging: No results found.   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   Hypertriglyceridemia 03/03/2017   Right knee pain 12/16/2016   Fracture of radial neck, right, closed 11/23/2016   Hyperglycemia 07/26/2016    Depression, recurrent (HCC) 07/26/2016   Hx of rheumatoid arthritis 07/25/2016   Allergic rhinitis 07/25/2016   Past Medical History:  Diagnosis Date   Allergy    Arthritis    Asthma    Depression    Hypertriglyceridemia 03/03/2017   IBS (irritable bowel syndrome)    Kidney stone    Meniscal injury, right, sequela 03/28/2017   Migraines     Family History  Problem Relation Age of Onset   Hyperlipidemia Mother    Hyperlipidemia Father    Heart disease Father    Hypertension Father    Kidney disease Father    Arthritis Maternal Grandmother    Hyperlipidemia Maternal Grandmother    Cancer Maternal Grandfather        prostate cancer   Hyperlipidemia Maternal Grandfather    Arthritis Paternal Grandmother    Hyperlipidemia Paternal Grandmother    Cancer Paternal Grandfather        colon cancer   Hyperlipidemia Paternal Grandfather    Heart disease Paternal Grandfather    Stroke Paternal Grandfather    Hypertension Paternal Grandfather     Past Surgical History:  Procedure Laterality Date   CESAREAN SECTION  1996   scar neunoma surgery  1998   Social History   Occupational History   Not on file  Tobacco Use   Smoking status: Never   Smokeless tobacco: Never  Vaping Use   Vaping Use: Never used  Substance and Sexual Activity   Alcohol use: Yes    Comment: social   Drug use: No   Sexual activity: Yes

## 2022-11-01 ENCOUNTER — Other Ambulatory Visit (INDEPENDENT_AMBULATORY_CARE_PROVIDER_SITE_OTHER): Payer: BC Managed Care – PPO

## 2022-11-01 ENCOUNTER — Ambulatory Visit: Payer: BC Managed Care – PPO | Admitting: Orthopaedic Surgery

## 2022-11-01 ENCOUNTER — Encounter: Payer: Self-pay | Admitting: Orthopaedic Surgery

## 2022-11-01 DIAGNOSIS — M1712 Unilateral primary osteoarthritis, left knee: Secondary | ICD-10-CM

## 2022-11-01 MED ORDER — BUPIVACAINE HCL 0.5 % IJ SOLN
2.0000 mL | INTRAMUSCULAR | Status: AC | PRN
Start: 2022-11-01 — End: 2022-11-01
  Administered 2022-11-01: 2 mL via INTRA_ARTICULAR

## 2022-11-01 MED ORDER — METHYLPREDNISOLONE ACETATE 40 MG/ML IJ SUSP
40.0000 mg | INTRAMUSCULAR | Status: AC | PRN
Start: 2022-11-01 — End: 2022-11-01
  Administered 2022-11-01: 40 mg via INTRA_ARTICULAR

## 2022-11-01 MED ORDER — LIDOCAINE HCL 1 % IJ SOLN
2.0000 mL | INTRAMUSCULAR | Status: AC | PRN
Start: 2022-11-01 — End: 2022-11-01
  Administered 2022-11-01: 2 mL

## 2022-11-15 ENCOUNTER — Encounter: Payer: BC Managed Care – PPO | Admitting: Nurse Practitioner

## 2022-11-30 ENCOUNTER — Encounter: Payer: BC Managed Care – PPO | Admitting: Nurse Practitioner

## 2022-12-15 ENCOUNTER — Encounter (INDEPENDENT_AMBULATORY_CARE_PROVIDER_SITE_OTHER): Payer: Self-pay

## 2022-12-28 DIAGNOSIS — S83282A Other tear of lateral meniscus, current injury, left knee, initial encounter: Secondary | ICD-10-CM | POA: Diagnosis not present

## 2023-01-11 ENCOUNTER — Encounter: Payer: BC Managed Care – PPO | Admitting: Nurse Practitioner

## 2023-02-01 DIAGNOSIS — M2352 Chronic instability of knee, left knee: Secondary | ICD-10-CM | POA: Diagnosis not present

## 2023-02-01 DIAGNOSIS — S83242A Other tear of medial meniscus, current injury, left knee, initial encounter: Secondary | ICD-10-CM | POA: Diagnosis not present

## 2023-02-01 DIAGNOSIS — M25462 Effusion, left knee: Secondary | ICD-10-CM | POA: Diagnosis not present

## 2023-02-01 DIAGNOSIS — M1712 Unilateral primary osteoarthritis, left knee: Secondary | ICD-10-CM | POA: Diagnosis not present

## 2023-03-01 DIAGNOSIS — S83242A Other tear of medial meniscus, current injury, left knee, initial encounter: Secondary | ICD-10-CM | POA: Diagnosis not present

## 2023-03-01 DIAGNOSIS — M1712 Unilateral primary osteoarthritis, left knee: Secondary | ICD-10-CM | POA: Diagnosis not present

## 2023-03-02 ENCOUNTER — Encounter: Payer: BC Managed Care – PPO | Admitting: Nurse Practitioner

## 2023-03-22 ENCOUNTER — Encounter: Payer: Self-pay | Admitting: Nurse Practitioner

## 2023-03-22 ENCOUNTER — Ambulatory Visit: Payer: BC Managed Care – PPO | Admitting: Nurse Practitioner

## 2023-03-22 VITALS — BP 140/80 | HR 83 | Temp 98.1°F | Resp 18 | Ht 69.0 in | Wt 239.0 lb

## 2023-03-22 DIAGNOSIS — E66812 Obesity, class 2: Secondary | ICD-10-CM

## 2023-03-22 DIAGNOSIS — I10 Essential (primary) hypertension: Secondary | ICD-10-CM

## 2023-03-22 DIAGNOSIS — Z0001 Encounter for general adult medical examination with abnormal findings: Secondary | ICD-10-CM

## 2023-03-22 DIAGNOSIS — Z780 Asymptomatic menopausal state: Secondary | ICD-10-CM

## 2023-03-22 DIAGNOSIS — Z6835 Body mass index (BMI) 35.0-35.9, adult: Secondary | ICD-10-CM

## 2023-03-22 DIAGNOSIS — G8929 Other chronic pain: Secondary | ICD-10-CM

## 2023-03-22 DIAGNOSIS — E781 Pure hyperglyceridemia: Secondary | ICD-10-CM | POA: Diagnosis not present

## 2023-03-22 DIAGNOSIS — M25562 Pain in left knee: Secondary | ICD-10-CM

## 2023-03-22 DIAGNOSIS — Z1231 Encounter for screening mammogram for malignant neoplasm of breast: Secondary | ICD-10-CM | POA: Diagnosis not present

## 2023-03-22 LAB — COMPREHENSIVE METABOLIC PANEL
ALT: 21 U/L (ref 0–35)
AST: 16 U/L (ref 0–37)
Albumin: 4.6 g/dL (ref 3.5–5.2)
Alkaline Phosphatase: 73 U/L (ref 39–117)
BUN: 12 mg/dL (ref 6–23)
CO2: 31 meq/L (ref 19–32)
Calcium: 9.6 mg/dL (ref 8.4–10.5)
Chloride: 100 meq/L (ref 96–112)
Creatinine, Ser: 0.57 mg/dL (ref 0.40–1.20)
GFR: 98.9 mL/min (ref >60.00)
Glucose, Bld: 97 mg/dL (ref 70–99)
Potassium: 4.4 meq/L (ref 3.5–5.1)
Sodium: 138 meq/L (ref 135–145)
Total Bilirubin: 0.8 mg/dL (ref 0.2–1.2)
Total Protein: 7.2 g/dL (ref 6.0–8.3)

## 2023-03-22 LAB — LIPID PANEL
Cholesterol: 292 mg/dL — ABNORMAL HIGH (ref 0–200)
HDL: 46.5 mg/dL (ref >39.00)
Total CHOL/HDL Ratio: 6
Triglycerides: 496 mg/dL — ABNORMAL HIGH (ref 0.0–149.0)

## 2023-03-22 LAB — LDL CHOLESTEROL, DIRECT: Direct LDL: 141 mg/dL

## 2023-03-22 MED ORDER — OMEGA-3-ACID ETHYL ESTERS 1 G PO CAPS: 2.0000 g | ORAL_CAPSULE | Freq: Two times a day (BID) | ORAL | 5 refills | Status: DC

## 2023-03-22 MED ORDER — MELOXICAM 15 MG PO TABS
15.0000 mg | ORAL_TABLET | Freq: Every day | ORAL | 1 refills | Status: DC
Start: 2023-03-22 — End: 2023-08-11

## 2023-03-22 NOTE — Assessment & Plan Note (Signed)
Advised about importance of DASH diet BP Readings from Last 3 Encounters:  03/22/23 (!) 140/80  10/24/22 120/80  09/15/22 130/80    F/up in 87month

## 2023-03-22 NOTE — Patient Instructions (Signed)
Go to lab Continue Heart healthy diet and daily exercise. Maintain current medications.  DASH Eating Plan DASH stands for Dietary Approaches to Stop Hypertension. The DASH eating plan is a healthy eating plan that has been shown to: Lower high blood pressure (hypertension). Reduce your risk for type 2 diabetes, heart disease, and stroke. Help with weight loss. What are tips for following this plan? Reading food labels Check food labels for the amount of salt (sodium) per serving. Choose foods with less than 5 percent of the Daily Value (DV) of sodium. In general, foods with less than 300 milligrams (mg) of sodium per serving fit into this eating plan. To find whole grains, look for the word "whole" as the first word in the ingredient list. Shopping Buy products labeled as "low-sodium" or "no salt added." Buy fresh foods. Avoid canned foods and pre-made or frozen meals. Cooking Try not to add salt when you cook. Use salt-free seasonings or herbs instead of table salt or sea salt. Check with your health care provider or pharmacist before using salt substitutes. Do not fry foods. Cook foods in healthy ways, such as baking, boiling, grilling, roasting, or broiling. Cook using oils that are good for your heart. These include olive, canola, avocado, soybean, and sunflower oil. Meal planning  Eat a balanced diet. This should include: 4 or more servings of fruits and 4 or more servings of vegetables each day. Try to fill half of your plate with fruits and vegetables. 6-8 servings of whole grains each day. 6 or less servings of lean meat, poultry, or fish each day. 1 oz is 1 serving. A 3 oz (85 g) serving of meat is about the same size as the palm of your hand. One egg is 1 oz (28 g). 2-3 servings of low-fat dairy each day. One serving is 1 cup (237 mL). 1 serving of nuts, seeds, or beans 5 times each week. 2-3 servings of heart-healthy fats. Healthy fats called omega-3 fatty acids are found in  foods such as walnuts, flaxseeds, fortified milks, and eggs. These fats are also found in cold-water fish, such as sardines, salmon, and mackerel. Limit how much you eat of: Canned or prepackaged foods. Food that is high in trans fat, such as fried foods. Food that is high in saturated fat, such as fatty meat. Desserts and other sweets, sugary drinks, and other foods with added sugar. Full-fat dairy products. Do not salt foods before eating. Do not eat more than 4 egg yolks a week. Try to eat at least 2 vegetarian meals a week. Eat more home-cooked food and less restaurant, buffet, and fast food. Lifestyle When eating at a restaurant, ask if your food can be made with less salt or no salt. If you drink alcohol: Limit how much you have to: 0-1 drink a day if you are female. 0-2 drinks a day if you are female. Know how much alcohol is in your drink. In the [place] one drink is one 12 oz bottle of beer (355 mL), one 5 oz glass of wine (148 mL), or one 1 oz glass of hard liquor (44 mL). General information Avoid eating more than 2,300 mg of salt a day. If you have hypertension, you may need to reduce your sodium intake to 1,500 mg a day. Work with your provider to stay at a healthy body weight or lose weight. Ask what the best weight range is for you. On most days of the week, get at least 30 minutes of  exercise that causes your heart to beat faster. This may include walking, swimming, or biking. Work with your provider or dietitian to adjust your eating plan to meet your specific calorie needs. What foods should I eat? Fruits All fresh, dried, or frozen fruit. Canned fruits that are in their natural juice and do not have sugar added to them. Vegetables Fresh or frozen vegetables that are raw, steamed, roasted, or grilled. Low-sodium or reduced-sodium tomato and vegetable juice. Low-sodium or reduced-sodium tomato sauce and tomato paste. Low-sodium or reduced-sodium canned  vegetables. Grains Whole-grain or whole-wheat bread. Whole-grain or whole-wheat pasta. Brown rice. Orpah Cobb. Bulgur. Whole-grain and low-sodium cereals. Pita bread. Low-fat, low-sodium crackers. Whole-wheat flour tortillas. Meats and other proteins Skinless chicken or [place]. Ground chicken or [place]. Pork with fat trimmed off. Fish and seafood. Egg whites. Dried beans, peas, or lentils. Unsalted nuts, nut butters, and seeds. Unsalted canned beans. Lean cuts of beef with fat trimmed off. Low-sodium, lean precooked or cured meat, such as sausages or meat loaves. Dairy Low-fat (1%) or fat-free (skim) milk. Reduced-fat, low-fat, or fat-free cheeses. Nonfat, low-sodium ricotta or cottage cheese. Low-fat or nonfat yogurt. Low-fat, low-sodium cheese. Fats and oils Soft margarine without trans fats. Vegetable oil. Reduced-fat, low-fat, or light mayonnaise and salad dressings (reduced-sodium). Canola, safflower, olive, avocado, soybean, and sunflower oils. Avocado. Seasonings and condiments Herbs. Spices. Seasoning mixes without salt. Other foods Unsalted popcorn and pretzels. Fat-free sweets. The items listed above may not be all the foods and drinks you can have. Talk to a dietitian to learn more. What foods should I avoid? Fruits Canned fruit in a light or heavy syrup. Fried fruit. Fruit in cream or butter sauce. Vegetables Creamed or fried vegetables. Vegetables in a cheese sauce. Regular canned vegetables that are not marked as low-sodium or reduced-sodium. Regular canned tomato sauce and paste that are not marked as low-sodium or reduced-sodium. Regular tomato and vegetable juices that are not marked as low-sodium or reduced-sodium. Rosita Fire. Olives. Grains Baked goods made with fat, such as croissants, muffins, or some breads. Dry pasta or rice meal packs. Meats and other proteins Fatty cuts of meat. Ribs. Fried meat. Tomasa Blase. Bologna, salami, and other precooked or cured meats, such as  sausages or meat loaves, that are not lean and low in sodium. Fat from the back of a pig (fatback). Bratwurst. Salted nuts and seeds. Canned beans with added salt. Canned or smoked fish. Whole eggs or egg yolks. Chicken or [place] with skin. Dairy Whole or 2% milk, cream, and half-and-half. Whole or full-fat cream cheese. Whole-fat or sweetened yogurt. Full-fat cheese. Nondairy creamers. Whipped toppings. Processed cheese and cheese spreads. Fats and oils Butter. Stick margarine. Lard. Shortening. Ghee. Bacon fat. Tropical oils, such as coconut, palm kernel, or palm oil. Seasonings and condiments Onion salt, garlic salt, seasoned salt, table salt, and sea salt. Worcestershire sauce. Tartar sauce. Barbecue sauce. Teriyaki sauce. Soy sauce, including reduced-sodium soy sauce. Steak sauce. Canned and packaged gravies. Fish sauce. Oyster sauce. Cocktail sauce. Store-bought horseradish. Ketchup. Mustard. Meat flavorings and tenderizers. Bouillon cubes. Hot sauces. Pre-made or packaged marinades. Pre-made or packaged taco seasonings. Relishes. Regular salad dressings. Other foods Salted popcorn and pretzels. The items listed above may not be all the foods and drinks you should avoid. Talk to a dietitian to learn more. Where to find more information National Heart, Lung, and Blood Institute (NHLBI): BuffaloDryCleaner.gl American Heart Association (AHA): heart.org Academy of Nutrition and Dietetics: eatright.org National Kidney Foundation (NKF): kidney.org This information is not  intended to replace advice given to you by your health care provider. Make sure you discuss any questions you have with your health care provider. Document Revised: 05/05/2022 Document Reviewed: 05/05/2022 Elsevier Patient Education  2024 [employer].

## 2023-03-22 NOTE — Progress Notes (Signed)
Complete physical exam  Patient: Christina Huerta   DOB: [birth year]   62 y.o. Female  MRN: [id number] Visit Date: 03/22/2023  Subjective:    Chief Complaint  Patient presents with   Annual Exam    PT is here for annual exam. PT is due for cervical screening and vaccinations. PT C/O of left knee pain    Caryol Shane is a 62 y.o. female who presents today for a complete physical exam. She reports consuming a general diet.  Limited due to chronic knee pain  She generally feels well. She reports sleeping well. She does have additional problems to discuss today.  Vision:Yes Dental:Yes STD Screen:No Colonoscopy by Dr. Verner Mould, [facility], tel.[phone]-report requested BP Readings from Last 3 Encounters:  03/22/23 (!) 140/80  10/24/22 120/80  09/15/22 130/80   Wt Readings from Last 3 Encounters:  03/22/23 239 lb (108.4 kg)  10/24/22 232 lb 3.2 oz (105.3 kg)  09/15/22 232 lb (105.2 kg)   Myalgia with fenofibrate.  Most recent fall risk assessment:    03/22/2023    9:24 AM  Fall Risk   Falls in the past year? 1  Number falls in past yr: 0  Injury with Fall? 0  Risk for fall due to : History of fall(s)  Follow up Falls evaluation completed   Depression screen:Yes - Depression Most recent depression screenings:    03/22/2023    9:38 AM 07/12/2022   11:15 AM  PHQ 2/9 Scores  PHQ - 2 Score 0 0  PHQ- 9 Score 1 0    HPI  Hypertriglyceridemia Unable to tolerate fenofibrate-myalgia Repeat lipid panel Lipid Panel     Component Value Date/Time   CHOL 292 (H) 03/22/2023 1033   CHOL 286 (H) 01/24/2020 1457   TRIG (H) 03/22/2023 1033    496.0 Triglyceride is over 400; calculations on Lipids are invalid.   HDL 46.50 03/22/2023 1033   HDL 47 01/24/2020 1457   CHOLHDL 6 03/22/2023 1033   VLDL 66.0 (H) 08/27/2021 0758   LDLCALC 154 (H) 01/24/2020 1457   LDLDIRECT 141.0 03/22/2023 1033   LABVLDL 85 (H) 01/24/2020 1457    Advised about importance of  mediterranean diet. Start lovaza Repeat labs in 72month (fasting)   Elevated blood pressure reading in office with diagnosis of hypertension Advised about importance of DASH diet BP Readings from Last 3 Encounters:  03/22/23 (!) 140/80  10/24/22 120/80  09/15/22 130/80    F/up in 72month .  Past Medical History:  Diagnosis Date   Allergy    Arthritis    Asthma    Depression    Hypertriglyceridemia 03/03/2017   IBS (irritable bowel syndrome)    Kidney stone    Meniscal injury, right, sequela 03/28/2017   Migraines    Past Surgical History:  Procedure Laterality Date   CESAREAN SECTION  1996   scar neunoma surgery  1998   Social History   Socioeconomic History   Marital status: Married    Spouse name: Not on file   Number of children: Not on file   Years of education: Not on file   Highest education level: Not on file  Occupational History   Not on file  Tobacco Use   Smoking status: Never   Smokeless tobacco: Never  Vaping Use   Vaping status: Never Used  Substance and Sexual Activity   Alcohol use: Yes    Comment: social   Drug use: No   Sexual activity: Yes  Other  Topics Concern   Not on file  Social History Narrative   Not on file   Social Determinants of Health   Financial Resource Strain: Not on file  Food Insecurity: No Food Insecurity (07/03/2020)   Received from [facility], [facility]   Hunger Vital Sign    Worried About Running Out of Food in the Last Year: Never true    Ran Out of Food in the Last Year: Never true  Transportation Needs: Not on file  Physical Activity: Not on file  Stress: Not on file  Social Connections: Unknown (09/11/2021)   Received from [facility], [facility]   Social Network    Social Network: Not on file  Intimate Partner Violence: Unknown (08/04/2021)   Received from [employer], [facility]   HITS    Physically Hurt: Not on file    Insult or Talk Down To: Not on file    Threaten Physical Harm: Not  on file    Scream or Curse: Not on file   Family Status  Relation Name Status   Mother  (Not Specified)   Father  Deceased   MGM  (Not Specified)   MGF  Deceased   PGM  (Not Specified)   PGF  (Not Specified)  No partnership data on file   Family History  Problem Relation Age of Onset   Hyperlipidemia Mother    Hyperlipidemia Father    Heart disease Father    Hypertension Father    Kidney disease Father    Arthritis Maternal Grandmother    Hyperlipidemia Maternal Grandmother    Cancer Maternal Grandfather        prostate cancer   Hyperlipidemia Maternal Grandfather    Arthritis Paternal Grandmother    Hyperlipidemia Paternal Grandmother    Cancer Paternal Grandfather        colon cancer   Hyperlipidemia Paternal Grandfather    Heart disease Paternal Grandfather    Stroke Paternal Grandfather    Hypertension Paternal Grandfather    Allergies  Allergen Reactions   Trimetrexate Anaphylaxis   Latex Other (See Comments) and Rash    Break out "water welts"   Sumatriptan Other (See Comments) and Rash    Face swelling,break out,red.  Only IM   Astemizole Nausea Only   Oxycodone Rash    Patient Care Team: Leul Narramore, Bonna Gains, NP as PCP - General (Internal Medicine) [facility]   Medications: Outpatient Medications Prior to Visit  Medication Sig   albuterol (VENTOLIN HFA) 108 (90 Base) MCG/ACT inhaler Inhale 1-2 puffs into the lungs every 6 (six) hours as needed.   citalopram (CELEXA) 20 MG tablet Take 1 tablet (20 mg total) by mouth daily.   Evening Primrose Oil 500 MG CAPS Take by mouth.   Multiple Vitamins-Minerals (MULTIVITAMIN ADULT PO) Take by mouth.   OVER THE COUNTER MEDICATION Vinegar Tab   triamcinolone cream (KENALOG) 0.1 % Apply 1 Application topically 2 (two) times daily.   VITAMIN E PO Take by mouth.   [DISCONTINUED] cyclobenzaprine (FLEXERIL) 10 MG tablet Take 1 tablet (10 mg total) by mouth at bedtime. (Patient not  taking: Reported on 03/22/2023)   [DISCONTINUED] fenofibrate (TRICOR) 145 MG tablet Take 1 tablet (145 mg total) by mouth daily. (Patient not taking: Reported on 03/22/2023)   [DISCONTINUED] gabapentin (NEURONTIN) 100 MG capsule Take 100 mg by mouth 3 (three) times daily. (Patient not taking: Reported on 03/22/2023)   [DISCONTINUED] meloxicam (MOBIC) 15 MG tablet Take 1 tablet (15 mg total)  by mouth daily. With food (Patient not taking: Reported on 03/22/2023)   [DISCONTINUED] mometasone-formoterol (DULERA) 100-5 MCG/ACT AERO Inhale 2 puffs into the lungs 2 (two) times daily at 10 AM and 5 PM. (Patient not taking: Reported on 03/22/2023)   [DISCONTINUED] montelukast (SINGULAIR) 10 MG tablet Take 1 tablet (10 mg total) by mouth at bedtime. (Patient not taking: Reported on 03/22/2023)   No facility-administered medications prior to visit.    Review of Systems  Constitutional:  Negative for activity change, appetite change and unexpected weight change.  Respiratory: Negative.    Cardiovascular: Negative.   Gastrointestinal: Negative.   Endocrine: Negative for cold intolerance and heat intolerance.  Genitourinary: Negative.   Musculoskeletal:  Positive for arthralgias and joint swelling.  Skin: Negative.   Neurological: Negative.   Hematological: Negative.   Psychiatric/Behavioral:  Negative for behavioral problems, decreased concentration, dysphoric mood, hallucinations, self-injury, sleep disturbance and suicidal ideas. The patient is not nervous/anxious.    Last CBC Lab Results  Component Value Date   WBC 5.7 01/14/2019   HGB 14.8 01/14/2019   HCT 42.3 01/14/2019   MCV 93.3 01/14/2019   RDW 13.1 01/14/2019   PLT 355.0 01/14/2019   Last metabolic panel Lab Results  Component Value Date   GLUCOSE 97 03/22/2023   NA 138 03/22/2023   K 4.4 03/22/2023   CL 100 03/22/2023   CO2 31 03/22/2023   BUN 12 03/22/2023   CREATININE 0.57 03/22/2023   GFR 98.90 03/22/2023   CALCIUM 9.6  03/22/2023   PROT 7.2 03/22/2023   ALBUMIN 4.6 03/22/2023   BILITOT 0.8 03/22/2023   ALKPHOS 73 03/22/2023   AST 16 03/22/2023   ALT 21 03/22/2023   Last lipids Lab Results  Component Value Date   CHOL 292 (H) 03/22/2023   HDL 46.50 03/22/2023   LDLCALC 154 (H) 01/24/2020   LDLDIRECT 141.0 03/22/2023   TRIG (H) 03/22/2023    496.0 Triglyceride is over 400; calculations on Lipids are invalid.   CHOLHDL 6 03/22/2023   Last hemoglobin A1c Lab Results  Component Value Date   HGBA1C 5.6 08/27/2021   Last thyroid functions Lab Results  Component Value Date   TSH 2.54 08/27/2021      Objective:  BP (!) 140/80 (BP Location: Left Arm, Patient Position: Sitting)   Pulse 83   Temp 98.1 F (36.7 C) (Temporal)   Resp 18   Ht 5\' 9"  (1.753 m)   Wt 239 lb (108.4 kg)   SpO2 97%   BMI 35.29 kg/m     Physical Exam Vitals and nursing note reviewed.  Constitutional:      General: She is not in acute distress. HENT:     Right Ear: Tympanic membrane, ear canal and external ear normal.     Left Ear: Tympanic membrane, ear canal and external ear normal.     Nose: Nose normal.  Eyes:     Extraocular Movements: Extraocular movements intact.     Conjunctiva/sclera: Conjunctivae normal.     Pupils: Pupils are equal, round, and reactive to light.  Neck:     Thyroid: No thyroid mass, thyromegaly or thyroid tenderness.  Cardiovascular:     Rate and Rhythm: Normal rate and regular rhythm.     Pulses: Normal pulses.     Heart sounds: Normal heart sounds.  Pulmonary:     Effort: Pulmonary effort is normal.     Breath sounds: Normal breath sounds.  Abdominal:     General: Bowel sounds are normal.  Palpations: Abdomen is soft.  Musculoskeletal:        General: Normal range of motion.     Cervical back: Normal range of motion and neck supple.     Right lower leg: No edema.     Left lower leg: No edema.  Lymphadenopathy:     Cervical: No cervical adenopathy.  Skin:    General:  Skin is warm and dry.  Neurological:     Mental Status: She is alert and oriented to person, place, and time.     Cranial Nerves: No cranial nerve deficit.  Psychiatric:        Mood and Affect: Mood normal.        Behavior: Behavior normal.        Thought Content: Thought content normal.      Results for orders placed or performed in visit on 03/22/23  Lipid panel  Result Value Ref Range   Cholesterol 292 (H) 0 - 200 mg/dL   Triglycerides (H) 0.0 - 149.0 mg/dL    469.6 Triglyceride is over 400; calculations on Lipids are invalid.   HDL 46.50 >39.00 mg/dL   Total CHOL/HDL Ratio 6   Comprehensive metabolic panel  Result Value Ref Range   Sodium 138 135 - 145 mEq/L   Potassium 4.4 3.5 - 5.1 mEq/L   Chloride 100 96 - 112 mEq/L   CO2 31 19 - 32 mEq/L   Glucose, Bld 97 70 - 99 mg/dL   BUN 12 6 - 23 mg/dL   Creatinine, Ser 2.95 0.40 - 1.20 mg/dL   Total Bilirubin 0.8 0.2 - 1.2 mg/dL   Alkaline Phosphatase 73 39 - 117 U/L   AST 16 0 - 37 U/L   ALT 21 0 - 35 U/L   Total Protein 7.2 6.0 - 8.3 g/dL   Albumin 4.6 3.5 - 5.2 g/dL   GFR 28.41 >32.44 mL/min   Calcium 9.6 8.4 - 10.5 mg/dL  LDL cholesterol, direct  Result Value Ref Range   Direct LDL 141.0 mg/dL      Assessment & Plan:    Routine Health Maintenance and Physical Exam   There is no immunization history on file for this patient.  Health Maintenance  Topic Date Due   Colonoscopy  06/29/2023   COVID-19 Vaccine (1 - 2023-24 season) 04/07/2023 (Originally 01/01/2023)   Cervical Cancer Screening (HPV/Pap Cotest)  04/21/2023 (Originally 12/23/1992)   Zoster Vaccines- Shingrix (1 of 2) 06/22/2023 (Originally 12/23/2012)   INFLUENZA VACCINE  07/31/2023 (Originally 12/01/2022)   DTaP/Tdap/Td (1 - Tdap) 03/21/2024 (Originally 12/23/1981)   HIV Screening  03/21/2024 (Originally 12/23/1977)   MAMMOGRAM  02/13/2024   Hepatitis C Screening  Completed   HPV VACCINES  Aged Out    Discussed health benefits of physical activity, and  encouraged her to engage in regular exercise appropriate for her age and condition.  Problem List Items Addressed This Visit     Chronic pain of left knee   Relevant Medications   meloxicam (MOBIC) 15 MG tablet   Elevated blood pressure reading in office with diagnosis of hypertension    Advised about importance of DASH diet BP Readings from Last 3 Encounters:  03/22/23 (!) 140/80  10/24/22 120/80  09/15/22 130/80    F/up in 33month      Relevant Medications   omega-3 acid ethyl esters (LOVAZA) 1 g capsule   Other Relevant Orders   Amb ref to Medical Nutrition Therapy-MNT   Hypertriglyceridemia    Unable to tolerate fenofibrate-myalgia  Repeat lipid panel Lipid Panel     Component Value Date/Time   CHOL 292 (H) 03/22/2023 1033   CHOL 286 (H) 01/24/2020 1457   TRIG (H) 03/22/2023 1033    496.0 Triglyceride is over 400; calculations on Lipids are invalid.   HDL 46.50 03/22/2023 1033   HDL 47 01/24/2020 1457   CHOLHDL 6 03/22/2023 1033   VLDL 66.0 (H) 08/27/2021 0758   LDLCALC 154 (H) 01/24/2020 1457   LDLDIRECT 141.0 03/22/2023 1033   LABVLDL 85 (H) 01/24/2020 1457    Advised about importance of mediterranean diet. Start lovaza Repeat labs in 31month (fasting)       Relevant Medications   omega-3 acid ethyl esters (LOVAZA) 1 g capsule   Other Relevant Orders   Lipid panel (Completed)   Amb ref to Medical Nutrition Therapy-MNT   Other Visit Diagnoses     Encounter for preventative adult health care exam with abnormal findings    -  Primary   Relevant Orders   Comprehensive metabolic panel (Completed)   Asymptomatic postmenopausal estrogen deficiency       Relevant Orders   DG Bone Density   Breast cancer screening by mammogram       Relevant Orders   MM 3D SCREENING MAMMOGRAM BILATERAL BREAST   Class 2 severe obesity due to excess calories with serious comorbidity and body mass index (BMI) of 35.0 to 35.9 in adult [facility])       Relevant Orders   Amb ref to  Medical Nutrition Therapy-MNT      Return in about 4 weeks (around 04/19/2023) for HTN.     [name], NP

## 2023-03-22 NOTE — Assessment & Plan Note (Addendum)
Unable to tolerate fenofibrate-myalgia Repeat lipid panel Lipid Panel     Component Value Date/Time   CHOL 292 (H) 03/22/2023 1033   CHOL 286 (H) 01/24/2020 1457   TRIG (H) 03/22/2023 1033    496.0 Triglyceride is over 400; calculations on Lipids are invalid.   HDL 46.50 03/22/2023 1033   HDL 47 01/24/2020 1457   CHOLHDL 6 03/22/2023 1033   VLDL 66.0 (H) 08/27/2021 0758   LDLCALC 154 (H) 01/24/2020 1457   LDLDIRECT 141.0 03/22/2023 1033   LABVLDL 85 (H) 01/24/2020 1457    Advised about importance of mediterranean diet. Start lovaza Repeat labs in 66month (fasting)

## 2023-03-27 ENCOUNTER — Encounter: Payer: Self-pay | Admitting: Nurse Practitioner

## 2023-03-27 ENCOUNTER — Ambulatory Visit: Payer: BC Managed Care – PPO | Admitting: Nurse Practitioner

## 2023-03-27 VITALS — BP 138/80 | HR 78 | Temp 98.3°F | Resp 18 | Wt 241.0 lb

## 2023-03-27 DIAGNOSIS — M778 Other enthesopathies, not elsewhere classified: Secondary | ICD-10-CM | POA: Diagnosis not present

## 2023-03-27 MED ORDER — TRAMADOL-ACETAMINOPHEN 37.5-325 MG PO TABS: 1.0000 | ORAL_TABLET | Freq: Three times a day (TID) | ORAL | 0 refills | Status: AC | PRN

## 2023-03-27 MED ORDER — PREDNISONE 20 MG PO TABS: ORAL_TABLET | ORAL | 0 refills | Status: AC

## 2023-03-27 NOTE — Patient Instructions (Signed)
Avoid OVER THE COUNTER NSAIDs Use ultracet-tylenol/tramadol for severe pain. Use tylenol 500mg  for mild pain Take oral prednisone with food. Schedule appointment with ortho if no improvement in 1week.  Rotator Cuff Tendinitis  Rotator cuff tendinitis is inflammation of the tendons in the rotator cuff. Tendons are tough, cord-like bands that connect muscle to bone. The rotator cuff includes all of the muscles and tendons that connect the arm to the shoulder. The rotator cuff holds the head of the humerus, or the upper arm bone, in the cup of the shoulder blade (scapula). This condition can lead to a long-term (chronic) tear. The tear may be partial or complete. What are the causes? This condition is usually caused by overusing the rotator cuff. What increases the risk? This condition is more likely to develop in athletes and workers who frequently use their shoulder or reach over their heads. This can include activities such as: Tennis. Baseball or softball. Swimming. Construction work. Painting. What are the signs or symptoms? Symptoms of this condition include: Pain that spreads (radiates) from the shoulder to the upper arm. Swelling and tenderness in front of the shoulder. Pain when reaching, pulling, or lifting the arm above the head. Pain when lowering the arm from above the head. Minor pain in the shoulder when resting. Increased pain in the shoulder at night. Difficulty placing the arm behind the back. How is this diagnosed? This condition is diagnosed with a physical exam and medical history. Tests may also be done, including: X-rays. CT. MRI. Ultrasound. How is this treated? Treatment depends on the severity of the condition. In less severe cases, treatment may include: Rest. This may be done with a sling that holds the shoulder still (immobilization). Your health care provider may also recommend avoiding activities that involve lifting your arm over your head. Icing the  shoulder. Anti-inflammatory medicines, such as aspirin or ibuprofen. In more severe cases, treatment may include: Physical therapy. Steroid injections. Surgery. Follow these instructions at home: If you have a removable sling: Wear the sling as told by your provider. Remove it only as told by your provider. Check the skin around the sling every day. Tell your provider about any concerns. Loosen the sling if your fingers tingle, become numb, or turn cold or blue. Keep the sling clean and dry. If the sling is not waterproof: Do not let it get wet. Remove it as told by your provider when you take a bath or shower. Managing pain, stiffness, and swelling  If told, put ice on the injured area. If you have a removable sling, remove it as told by your provider. Put ice in a plastic bag. Place a towel between your skin and the bag. Leave the ice on for 20 minutes, 2-3 times a day. If your skin turns bright red, remove the ice right away to prevent skin damage. The risk of damage is higher if you cannot feel pain, heat, or cold. Move your fingers often to reduce stiffness and swelling. Raise (elevate) the injured area above the level of your heart while you are sitting or lying down. Find a comfortable sleeping position, or sleep in a recliner, if available. Activity Rest your shoulder as told by your provider. Ask your provider when it is safe to drive if you have a sling on your arm. Return to your normal activities as told by your provider. Ask your provider what activities are safe for you. Do any exercises or stretches as told by your provider or physical therapist.  If you do repetitive overhead tasks, take small breaks in between and include stretching exercises as told by your provider. General instructions Do not use any products that contain nicotine or tobacco. These products include cigarettes, chewing tobacco, and vaping devices, such as e-cigarettes. These can delay healing. If  you need help quitting, ask your provider. Take over-the-counter and prescription medicines only as told by your provider. Contact a health care provider if: Your pain gets worse. You have new pain in your arm, hands, or fingers. Your pain is not relieved with medicine or does not get better after 6 weeks of treatment. You have crackling sensations when moving your shoulder in certain directions. You hear a snapping sound after using your shoulder, followed by severe pain and weakness. Your arm, hand, or fingers are numb or tingling. Get help right away if: Your arm, hand, or fingers are swollen, painful, or they turn white or blue. This information is not intended to replace advice given to you by your health care provider. Make sure you discuss any questions you have with your health care provider. Document Revised: 12/15/2021 Document Reviewed: 12/01/2021 Elsevier Patient Education  2024 [employer].

## 2023-03-27 NOTE — Progress Notes (Signed)
Established Patient Visit  Patient: Christina Huerta   DOB: 1960-07-28   62 y.o. Female  MRN: [id number] Visit Date: 03/27/2023  Subjective:    Chief Complaint  Patient presents with   Acute Visit    PT C/O of right shoulder pain after decorating christmas tree on Saturday; PT experienced popping sound while reaching very painful and unable to lift arm normally. PT used ice packs for pain in which helped a little.   Shoulder Injury  The incident occurred at school. The right shoulder is affected. The incident occurred 6 to 12 hours ago. The injury mechanism was repetitive motion. The quality of the pain is described as aching. The pain does not radiate. The pain is severe. Pertinent negatives include no chest pain, muscle weakness, numbness or tingling. The symptoms are aggravated by movement and overhead lifting. She has tried acetaminophen, NSAIDs and immobilization for the symptoms. The treatment provided no relief.  Hx of RA and OA  Reviewed medical, surgical, and social history today  Medications: Outpatient Medications Prior to Visit  Medication Sig   albuterol (VENTOLIN HFA) 108 (90 Base) MCG/ACT inhaler Inhale 1-2 puffs into the lungs every 6 (six) hours as needed.   citalopram (CELEXA) 20 MG tablet Take 1 tablet (20 mg total) by mouth daily.   erythromycin ophthalmic ointment SMARTSIG:Sparingly In Eye(s) Twice Daily   Evening Primrose Oil 500 MG CAPS Take by mouth.   meloxicam (MOBIC) 15 MG tablet Take 1 tablet (15 mg total) by mouth daily. With food   Multiple Vitamins-Minerals (MULTIVITAMIN ADULT PO) Take by mouth.   omega-3 acid ethyl esters (LOVAZA) 1 g capsule Take 2 capsules (2 g total) by mouth 2 (two) times daily.   OVER THE COUNTER MEDICATION Vinegar Tab   triamcinolone cream (KENALOG) 0.1 % Apply 1 Application topically 2 (two) times daily.   VITAMIN E PO Take by mouth.   No facility-administered medications prior to visit.   Reviewed past medical and  social history.   ROS per HPI above      Objective:  BP 138/80   Pulse 78   Temp 98.3 F (36.8 C) (Temporal)   Resp 18   Wt 241 lb (109.3 kg)   SpO2 98%   BMI 35.59 kg/m      Physical Exam Vitals and nursing note reviewed.  Cardiovascular:     Rate and Rhythm: Normal rate.     Pulses: Normal pulses.  Pulmonary:     Effort: Pulmonary effort is normal.  Musculoskeletal:        General: Tenderness present. No swelling.     Right shoulder: Tenderness present. No swelling, deformity, effusion, laceration, bony tenderness or crepitus. Decreased range of motion. Normal strength. Normal pulse.     Right upper arm: Normal.     Cervical back: Normal range of motion and neck supple.     Comments: Difficulty with forward and lateral extension >90degrees. limited internal and external rotation. Pain with movement only No pain with palpation on shoulder.  Neurological:     Mental Status: She is alert and oriented to person, place, and time.     No results found for any visits on 03/27/23.    Assessment & Plan:    Problem List Items Addressed This Visit   None Visit Diagnoses     Right shoulder tendonitis    -  Primary   Relevant Medications   predniSONE (DELTASONE)  20 MG tablet   traMADol-acetaminophen (ULTRACET) 37.5-325 MG tablet     Avoid OVER THE COUNTER NSAIDs Use ultracet-tylenol/tramadol for severe pain. Use tylenol 500mg  for mild pain Take oral prednisone with food. Schedule appointment with ortho if no improvement in 1week.  Return if symptoms worsen or fail to improve.     [name], NP

## 2023-04-19 ENCOUNTER — Ambulatory Visit: Payer: BC Managed Care – PPO | Admitting: Nurse Practitioner

## 2023-05-12 ENCOUNTER — Ambulatory Visit: Payer: BC Managed Care – PPO | Admitting: Nurse Practitioner

## 2023-05-30 ENCOUNTER — Encounter: Payer: Self-pay | Admitting: Skilled Nursing Facility1

## 2023-05-30 ENCOUNTER — Encounter: Payer: BC Managed Care – PPO | Attending: Nurse Practitioner | Admitting: Skilled Nursing Facility1

## 2023-05-30 DIAGNOSIS — E781 Pure hyperglyceridemia: Secondary | ICD-10-CM | POA: Insufficient documentation

## 2023-05-30 NOTE — Progress Notes (Signed)
Medical Nutrition Therapy  Appointment Start time:  9:58  Appointment End time:  11:00  Primary concerns today: high triglycerides and cholesterol   Referral diagnosis: E78.1   NUTRITION ASSESSMENT    Clinical Medical Hx: depression, hyperlipidemia, kidney stone, IBS Medications: see list  Labs: triglycerides 496, cholesterol 292 Notable Signs/Symptoms: arrived in a knee brace  Lifestyle & Dietary Hx  Pt states she Larey Seat resulting in a torn meniscus which is still getting in the way of her mobility. Pt states her husband is more of a meat and potatoes kind of guy. Pt states she really enjoys cooking.   Estimated daily fluid intake: 64+ oz Supplements: fish oil, B-complex, vinegar tablets, black cohosh, multivitamin  Sleep: pt states not good due to knee pain and having to urinate in the night every hour Stress / self-care: fairly high due to her husband and her own health issues  Current average weekly physical activity: ADL's  24-Hr Dietary Recall: eating out 3 days a week First Meal: yogurt + granola + honey  Snack: oyster crackers Second Meal: skipped or can of peas or lima beans with crackers  Snack: grapes Third Meal: vegetable soup or potato soup (sour cream, chicken soup) Snack:  Beverages: sweet tea, 32 oz water, flavored   Estimated Energy Needs Calories: 1800   NUTRITION INTERVENTION  Nutrition education (E-1) on the following topics:  1. Choose Healthy Fats Fats are essential in the diet, but the type of fat you consume is critical for managing cholesterol levels. Good Fats (Unsaturated Fats) Monounsaturated fats: Found in olive oil, avocados, nuts (almonds, walnuts, cashews), and seeds. Polyunsaturated fats: Found in fatty fish (salmon, mackerel, sardines), flaxseeds, chia seeds, walnuts, and plant-based oils like soybean, sunflower, and corn oil. These fats can help lower LDL (bad cholesterol) and increase HDL (good cholesterol). Limit Saturated Fats Found  in red meat, butter, full-fat dairy products, and tropical oils (coconut oil, palm oil). Excessive saturated fat intake can raise LDL cholesterol levels. Avoid Trans Fats Found in partially hydrogenated oils, margarine, and many processed and fried foods. Trans fats significantly increase the risk of heart disease and should be avoided. 2. Increase Fiber Intake Fiber helps lower cholesterol levels, especially soluble fiber, which binds to cholesterol in the digestive system and helps excrete it from the body. High-Fiber Foods to Include Fruits: Apples, pears, berries, oranges, and bananas. Vegetables: Broccoli, Brussels sprouts, carrots, sweet potatoes, and leafy greens. Whole grains: Oats, barley, quinoa, brown rice, and whole wheat products. Legumes: Beans, lentils, chickpeas, and peas. Nuts and seeds: Almonds, flaxseeds, chia seeds, and walnuts. Aim for 25-30 grams of fiber per day. 3. Incorporate Plant Sterols and Stanols Plant sterols and stanols are naturally occurring substances found in plant-based foods that can help lower LDL cholesterol. They block the absorption of cholesterol in the intestines. Foods Rich in Sterols and Stanols Fortified foods like margarine, orange juice, and yogurt. Nuts and seeds, particularly sunflower seeds and sesame seeds. Whole grains, fruits, and vegetables. 4. Consume Omega-3 Fatty Acids Omega-3 fatty acids are beneficial for heart health as they help reduce triglycerides and lower the risk of heart disease. Omega-3 Sources Fatty fish: Salmon, mackerel, sardines, trout, and herring. Flaxseeds and chia seeds. Walnuts. Plant oils: Canola oil, soybean oil. Aim for 2-3 servings of fatty fish per week or consider adding a daily supplement of 1,000-2,000 mg of EPA and DHA (the active forms of omega-3). 5. Limit Refined Carbohydrates and Sugary Foods Eating foods high in refined carbohydrates and added sugars  can increase triglyceride levels and  contribute to the development of insulin resistance. Foods to Limit Sugary beverages (sodas, sweetened teas, etc.). Pastries, cakes, cookies, and other processed snacks. White bread, white rice, and other refined grains. High-fructose corn syrup-containing foods. Instead, focus on complex carbohydrates such as whole grains, vegetables, legumes, and fruits, which provide more fiber and nutrients. 6. Increase Antioxidant-Rich Foods Antioxidants help protect the blood vessels and prevent inflammation, which can be important for heart health. Foods High in Antioxidants Berries: Blueberries, strawberries, raspberries. Dark leafy greens: Spinach, kale, Swiss chard. Cruciferous vegetables: Broccoli, cauliflower, Brussels sprouts. Nuts and seeds: Almonds, sunflower seeds, flaxseeds. Colorful vegetables: Carrots, bell peppers, and beets. 7. Reduce Sodium Intake Excessive sodium can increase blood pressure, which, combined with high cholesterol, raises the risk of heart disease. Tips to Reduce Sodium Limit processed and canned foods, as they often contain added salt. Avoid salty snacks, such as chips, pretzels, and processed meats (bacon, sausages, etc.). Use herbs and spices like garlic, turmeric, rosemary, and thyme instead of salt to flavor food. Opt for fresh or frozen vegetables over canned varieties. 8. Moderate Alcohol Consumption Excessive alcohol can raise triglyceride levels and contribute to liver damage. However, moderate alcohol consumption, particularly red wine, has been shown to have heart-protective benefits due to its polyphenol content. Moderate consumption is defined as one drink per day for women and two drinks per day for men. A "drink" is typically 5 oz of wine, 12 oz of beer, or 1.5 oz of distilled spirits. 9. Focus on Portion Control and Caloric Balance Managing weight is important for controlling hyperlipidemia. Being overweight or obese can lead to higher cholesterol and  triglyceride levels. Practice portion control and avoid overeating. Limit high-calorie snacks and choose healthier options like fresh fruit, nuts, or whole-grain crackers. Aiming for gradual, sustainable weight loss (1-2 pounds per week) can improve lipid levels and overall health. Sample Day of Eating for Hyperlipidemia Breakfast: Oatmeal topped with chia seeds, fresh berries, and a drizzle of almond butter. A cup of black coffee or green tea. Snack: A small handful of mixed nuts (almonds, walnuts, and cashews). Lunch: Grilled salmon salad with mixed greens, cherry tomatoes, avocado, olive oil, and lemon dressing. A whole-grain roll on the side. Snack: Sliced apple with a tablespoon of peanut or almond butter. Dinner: [place] with roasted vegetables (broccoli, carrots, and bell peppers). Grilled chicken breast or tofu for protein. Steamed spinach with olive oil and garlic. Dessert (optional): A small serving of fresh fruit or a handful of dark chocolate (70% or higher). Summary of Key Dietary Tips for Hyperlipidemia: Choose healthy fats (unsaturated fats from plants and fish). Increase fiber from fruits, vegetables, whole grains, and legumes. Include omega-3 fatty acids from fatty fish, flaxseeds, and walnuts. Limit saturated fats and avoid trans fats found in processed foods. Control portion sizes to maintain a healthy weight. Reduce refined carbohydrates and sugars to manage triglyceride levels. Stay active: Regular physical activity (at least 150 minutes per week) helps improve lipid profiles  Handouts Provided Include  Different types of fats explained and what foods to find them in Detailed MyPlate  Learning Style & Readiness for Change Teaching method utilized: Visual & Auditory  Demonstrated degree of understanding via: Teach Back  Barriers to learning/adherence to lifestyle change: unidentified   Goals Established by Pt I will do 2 rounds of arm chair workouts per  day   MONITORING & EVALUATION Dietary intake, weekly physical activity  Next Steps  Patient is to call or email  with any future questions or concerns.

## 2023-07-03 DIAGNOSIS — M1712 Unilateral primary osteoarthritis, left knee: Secondary | ICD-10-CM | POA: Diagnosis not present

## 2023-07-03 DIAGNOSIS — M23204 Derangement of unspecified medial meniscus due to old tear or injury, left knee: Secondary | ICD-10-CM | POA: Diagnosis not present

## 2023-07-03 DIAGNOSIS — Z1330 Encounter for screening examination for mental health and behavioral disorders, unspecified: Secondary | ICD-10-CM | POA: Diagnosis not present

## 2023-07-10 DIAGNOSIS — S76312D Strain of muscle, fascia and tendon of the posterior muscle group at thigh level, left thigh, subsequent encounter: Secondary | ICD-10-CM | POA: Diagnosis not present

## 2023-07-10 DIAGNOSIS — M792 Neuralgia and neuritis, unspecified: Secondary | ICD-10-CM | POA: Diagnosis not present

## 2023-08-11 ENCOUNTER — Ambulatory Visit: Payer: Self-pay | Admitting: *Deleted

## 2023-08-11 ENCOUNTER — Encounter: Payer: Self-pay | Admitting: Family Medicine

## 2023-08-11 ENCOUNTER — Ambulatory Visit: Admitting: Family Medicine

## 2023-08-11 VITALS — BP 126/84 | HR 77 | Temp 97.2°F | Wt 239.0 lb

## 2023-08-11 DIAGNOSIS — M25562 Pain in left knee: Secondary | ICD-10-CM | POA: Diagnosis not present

## 2023-08-11 DIAGNOSIS — F339 Major depressive disorder, recurrent, unspecified: Secondary | ICD-10-CM

## 2023-08-11 DIAGNOSIS — J329 Chronic sinusitis, unspecified: Secondary | ICD-10-CM

## 2023-08-11 DIAGNOSIS — G8929 Other chronic pain: Secondary | ICD-10-CM

## 2023-08-11 DIAGNOSIS — H66005 Acute suppurative otitis media without spontaneous rupture of ear drum, recurrent, left ear: Secondary | ICD-10-CM

## 2023-08-11 MED ORDER — AMOXICILLIN-POT CLAVULANATE 875-125 MG PO TABS: 1.0000 | ORAL_TABLET | Freq: Two times a day (BID) | ORAL | 0 refills | Status: DC

## 2023-08-11 MED ORDER — MELOXICAM 15 MG PO TABS: 15.0000 mg | ORAL_TABLET | Freq: Every day | ORAL | 1 refills | Status: DC

## 2023-08-11 MED ORDER — FLUCONAZOLE 150 MG PO TABS: 150.0000 mg | ORAL_TABLET | Freq: Once | ORAL | 0 refills | Status: AC

## 2023-08-11 MED ORDER — CITALOPRAM HYDROBROMIDE 20 MG PO TABS: 20.0000 mg | ORAL_TABLET | Freq: Every day | ORAL | 3 refills | Status: AC

## 2023-08-11 NOTE — Telephone Encounter (Signed)
 Copied from CRM [id number]. Topic: Clinical - Prescription Issue >> Aug 11, 2023  2:51 PM Florestine Avers wrote: Reason for CRM: Patient called in stating that she forgot to tell the doctor when she takes amoxicillin she gets a yeast infection and she wanted to know if they could send over something for it.

## 2023-08-11 NOTE — Telephone Encounter (Signed)
 Noted. Pt scheduled With Family Medicine Garnette Gunner, MD) 08/11/2023 at 11:00 AM

## 2023-08-11 NOTE — Progress Notes (Signed)
 Assessment/Plan:    Assessment & Plan Left maxillary sinusitis and left otitis media with effusion Symptoms include left ear pain, fluid sensation, left-sided mouth pain, and sinus pain radiating to the teeth. Examination reveals tenderness in the left maxillary region and fluid in the left ear, indicating maxillary sinusitis and otitis media with effusion. Due to upcoming travel and a busy schedule, antibiotic therapy was initiated to prevent infection worsening. Discussed potential post-antibiotic diarrhea and benefits of probiotics. Advised against over-the-counter decongestants due to cardiovascular effects. - Prescribe amoxicillin-clavulanate (Augmentin) 875 mg/125 mg, one tablet twice daily for 10 days. - Recommend probiotics to mitigate potential post-antibiotic diarrhea. - Advise nasal steroids to relieve sinus pressure, with caution regarding potential epistaxis. - Avoid over-the-counter decongestants due to potential increase in heart rate and blood pressure. - Follow-up as needed  Left knee pain with torn meniscus Reports left knee pain and instability due to a torn meniscus. Benefits from a new knee brace with a gel ring. Under orthopedic care with a follow-up appointment scheduled. Has tried therapy, cortisone injections, and topical treatments like Voltaren gel and a turmeric and emu oil rub, which are effective. Orthopedics considering hyaluronic acid injection for additional relief. - Continue using the new knee brace for support. - Follow up with orthopedics for potential hyaluronic acid injection. - Refill meloxicam 15 mg daily as needed for pain management.  Depression On citalopram for depression with significant mood stability. Denies thoughts of self-harm or harm to others, but notes increased stress due to her husband's health issues. Citalopram is effective in maintaining mood stability. - Refill citalopram prescription.  General Health Maintenance Does not smoke or  vape. Uses apple cider vinegar tablets effectively for heartburn. Aware of probiotics and natural supplements for overall health. Discussed benefits of probiotics for gut health and overall well-being. - Encourage continued use of apple cider vinegar tablets for heartburn management. - Discuss the potential benefits of probiotics for gut health and overall well-being.      Medications Discontinued During This Encounter  Medication Reason   citalopram (CELEXA) 20 MG tablet Reorder   meloxicam (MOBIC) 15 MG tablet Reorder        Subjective:   Encounter date: 08/11/2023  Christina Huerta is a 63 y.o. 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   She  has a past medical history of Allergy, Arthritis, Asthma, Depression, Hypertriglyceridemia (03/03/2017), IBS (irritable bowel syndrome), Kidney stone, Meniscal injury, right, sequela (03/28/2017), and Migraines..   She presents with chief complaint of Ear Pain (left ear pain, feels like fluid in ear, teeth back of mouth left side pain. Sinus pressure on left cheek pushing on top 3 teeth. ) .   Discussed the use of AI scribe software for clinical note transcription with the patient, who gave verbal consent to proceed.  History of Present Illness Christina Huerta is a 63 year old female who presents with left ear pain, sinus pain, and left-sided facial pressure.  She experiences left ear pain, a sensation of fluid in the ear, and  sinus pain localized to the left cheek, radiating  down to her teeth. The sensation is described as a pressing down feeling on her back teeth, where she has had root canals. She typically feels sinus infections in this area first. No right ear pain. She has not taken any antibiotics in the past month. Nasal sprays typically cause epistaxis, so she uses AYR gel instead.  She has a history of a torn meniscus in her left knee, which sometimes feels like it might give out. She has tried various treatments including a new knee brace, ice, heat, cortisone injections, and physical therapy. She is awaiting a follow-up with orthopedics for potential gel injections. She is currently taking meloxicam intermittently for pain, as she is cautious about long-term use. She also takes vinegar tablets for heartburn and has recently started using a turmeric and blue emu oil rub for her knee, which she feels has reduced swelling.  She is currently taking citalopram and notes a significant difference in her mood when not taking it. She denies any thoughts of self-harm or harm to others, although she acknowledges increased stress due to her husband's health issues.  She does not smoke or vape. She is currently on disability and has a supportive family network, including her 89 year old mother who helps with caregiving. Her husband recently suffered a stroke, which has led to personality changes and increased stress at home.     ROS  Past Surgical History:  Procedure Laterality Date   CESAREAN SECTION  1996   scar neunoma surgery  1998    Outpatient Medications Prior to Visit  Medication Sig Dispense Refill   albuterol (VENTOLIN HFA) 108 (90 Base) MCG/ACT inhaler Inhale 1-2 puffs into the lungs every 6 (six) hours as needed. 1 each 0   Evening Primrose Oil 500 MG CAPS Take by mouth.     MAGNESIUM PO Take by mouth.     Moringa Oleifera (MORINGA PO) Take 8,000 mg by mouth.     Multiple Vitamins-Minerals (MULTIVITAMIN  ADULT PO) Take by mouth.     omega-3 acid ethyl esters (LOVAZA) 1 g capsule Take 2 capsules (2 g total) by mouth 2 (two) times daily. 120 capsule 5   OVER THE COUNTER MEDICATION Vinegar Tab     VITAMIN E PO Take by mouth.     citalopram (CELEXA) 20 MG tablet Take 1 tablet (20 mg total) by mouth daily. 90 tablet 3   meloxicam (MOBIC) 15 MG tablet Take 1 tablet (15 mg total) by mouth daily. With food 90 tablet 1   erythromycin ophthalmic ointment SMARTSIG:Sparingly In Eye(s) Twice Daily (Patient not taking: Reported on 08/11/2023)     triamcinolone cream (KENALOG) 0.1 % Apply 1 Application topically 2 (two) times daily. (Patient not taking: Reported on 08/11/2023) 30 g 0   No facility-administered medications prior to visit.    Family History  Problem Relation Age of Onset   Hyperlipidemia Mother    Hyperlipidemia Father    Heart disease Father    Hypertension Father    Kidney disease Father    Arthritis Maternal Grandmother    Hyperlipidemia Maternal Grandmother    Cancer Maternal Grandfather        prostate cancer   Hyperlipidemia Maternal Grandfather    Arthritis Paternal Grandmother    Hyperlipidemia Paternal Grandmother    Cancer Paternal Grandfather        colon cancer   Hyperlipidemia Paternal Grandfather    Heart disease Paternal Grandfather    Stroke Paternal Grandfather    Hypertension Paternal Actor  Social History   Socioeconomic History   Marital status: Married    Spouse name: Not on file   Number of children: Not on file   Years of education: Not on file   Highest education level: Not on file  Occupational History   Not on file  Tobacco Use   Smoking status: Never   Smokeless tobacco: Never  Vaping Use   Vaping status: Never Used  Substance and Sexual Activity   Alcohol use: Yes    Comment: social   Drug use: No   Sexual activity: Yes  Other Topics Concern   Not on file  Social History Narrative   Not on file   Social Drivers of Health    Financial Resource Strain: Patient Declined (07/03/2023)   Received from [facility]   Overall Financial Resource Strain (CARDIA)    Difficulty of Paying Living Expenses: Patient declined  Food Insecurity: Patient Declined (07/03/2023)   Received from [facility]   Hunger Vital Sign    Worried About Running Out of Food in the Last Year: Patient declined    Ran Out of Food in the Last Year: Patient declined  Transportation Needs: Patient Declined (07/03/2023)   Received [facility] - [facility]    Lack of Transportation (Medical): Patient declined    Lack of Transportation (Non-Medical): Patient declined  Physical Activity: Not on file  Stress: Not on file  Social Connections: Unknown (09/11/2021)   Received from [facility], [facility]   Social Network    Social Network: Not on file  Intimate Partner Violence: Unknown (08/04/2021)   Received [facility], [facility]   HITS    Physically Hurt: Not on file    Insult or Talk Down To: Not on file    Threaten Physical Harm: Not on file    Scream or Curse: Not on file                                                                                                  Objective:  Physical Exam: BP 126/84   Pulse 77   Temp (!) 97.2 F (36.2 C) (Temporal)   Wt 239 lb (108.4 kg)   SpO2 95%   BMI 35.29 kg/m     Physical Exam Constitutional:      General: She is not in acute distress.    Appearance: Normal appearance. She is not ill-appearing or toxic-appearing.  HENT:     Head: Normocephalic and atraumatic.     Left Ear: Tenderness present. A middle ear effusion is present. Tympanic membrane is injected, erythematous and bulging.     Nose: No congestion.     Left Sinus: Maxillary sinus tenderness present.  Eyes:     General: No scleral icterus.    Extraocular Movements: Extraocular movements intact.  Cardiovascular:     Rate and Rhythm: Normal rate and regular rhythm.     Pulses: Normal  pulses.     Heart sounds: Normal heart sounds.  Pulmonary:     Effort: Pulmonary effort is normal. No respiratory distress.  Breath sounds: Normal breath sounds.  Abdominal:     General: Abdomen is flat. Bowel sounds are normal.     Palpations: Abdomen is soft.  Musculoskeletal:        General: Normal range of motion.  Lymphadenopathy:     Cervical: No cervical adenopathy.  Skin:    General: Skin is warm and dry.     Findings: No rash.  Neurological:     General: No focal deficit present.     Mental Status: She is alert and oriented to person, place, and time. Mental status is at baseline.  Psychiatric:        Mood and Affect: Mood normal. Affect is tearful.        Behavior: Behavior is cooperative.     No results found.  No results found for this or any previous visit (from the past 2160 hours).      Carnell Christian, MD, MS

## 2023-08-11 NOTE — Telephone Encounter (Signed)
  Chief Complaint: left ear pain, feels like fluid in ear, teeth back of mouth left side pain, previous OTC meds at home not working  Symptoms: left ear pain, fullness, "feels like fluid in ear". Pain sinuses noted "back 3 teeth start to hurt" starts sinus issues. Drainage back of throat causes coughing. Has tried OTC zyrtec, claritin, vicks vapor rub with no relief. Irritating pain Frequency: yesterday  Pertinent Negatives: Patient denies fever, no drainage from ear no dizziness reported  Disposition: [] ED /[] Urgent Care (no appt availability in office) / [x] Appointment(In office/virtual)/ []  [facility]/ [] Home Care/ [] Refused Recommended Disposition /[] [place] [facility]/ []  Follow-up with PCP Additional Notes:   No available appt with PCP . Scheduled appt with other provider in office today.       Copied from CRM [id number]. Topic: Clinical - Red Word Triage >> Aug 11, 2023  8:21 AM Gurney Maxin H wrote: [facility] that prompted transfer to Nurse Triage: Hearing fluid in ear, pain in ear and all on left side Reason for Disposition  Earache  (Exceptions: brief ear pain of < 60 minutes duration, earache occurring during air travel  Answer Assessment - Initial Assessment Questions 1. LOCATION: "Which ear is involved?"     Left ear  2. ONSET: "When did the ear start hurting"      Yesterday  3. SEVERITY: "How bad is the pain?"  (Scale 1-10; mild, moderate or severe)   - MILD (1-3): doesn't interfere with normal activities    - MODERATE (4-7): interferes with normal activities or awakens from sleep    - SEVERE (8-10): excruciating pain, unable to do any normal activities      Irritating 4. URI SYMPTOMS: "Do you have a runny nose or cough?"     Pressure back three teeth . Drainage back of throat cough  5. FEVER: "Do you have a fever?" If Yes, ask: "What is your temperature, how was it measured, and when did it start?"     N a 6. CAUSE: "Have you been swimming recently?", "How  often do you use Q-TIPS?", "Have you had any recent air travel or scuba diving?"     na 7. OTHER SYMPTOMS: "Do you have any other symptoms?" (e.g., headache, stiff neck, dizziness, vomiting, runny nose, decreased hearing)     Left ear pain, "feels like getting sinus infection' 8. PREGNANCY: "Is there any chance you are pregnant?" "When was your last menstrual period?"     na  Protocols used: Davina Poke

## 2023-08-14 DIAGNOSIS — S76312D Strain of muscle, fascia and tendon of the posterior muscle group at thigh level, left thigh, subsequent encounter: Secondary | ICD-10-CM | POA: Diagnosis not present

## 2023-08-14 DIAGNOSIS — S83282D Other tear of lateral meniscus, current injury, left knee, subsequent encounter: Secondary | ICD-10-CM | POA: Diagnosis not present

## 2023-08-14 NOTE — Telephone Encounter (Signed)
Left detailed VM that RX was sent to the pharmacy.  Dm/cma

## 2023-08-28 ENCOUNTER — Encounter: Payer: Self-pay | Admitting: Family Medicine

## 2023-08-29 NOTE — Telephone Encounter (Signed)
 Lvmtcb, Dr Hildy Lowers has available appt's today 08/29/23.

## 2023-08-31 ENCOUNTER — Encounter: Payer: Self-pay | Admitting: Nurse Practitioner

## 2023-08-31 ENCOUNTER — Ambulatory Visit: Admitting: Nurse Practitioner

## 2023-08-31 VITALS — BP 140/82 | HR 88 | Temp 97.4°F | Ht 69.0 in | Wt 240.8 lb

## 2023-08-31 DIAGNOSIS — J329 Chronic sinusitis, unspecified: Secondary | ICD-10-CM | POA: Diagnosis not present

## 2023-08-31 DIAGNOSIS — I10 Essential (primary) hypertension: Secondary | ICD-10-CM

## 2023-08-31 DIAGNOSIS — E781 Pure hyperglyceridemia: Secondary | ICD-10-CM | POA: Diagnosis not present

## 2023-08-31 MED ORDER — FLUTICASONE PROPIONATE 50 MCG/ACT NA SUSP: 2.0000 | Freq: Every day | NASAL | 5 refills | Status: AC

## 2023-08-31 MED ORDER — AZELASTINE HCL 0.1 % NA SOLN
1.0000 | Freq: Two times a day (BID) | NASAL | 5 refills | Status: DC
Start: 2023-08-31 — End: 2024-01-24

## 2023-08-31 NOTE — Patient Instructions (Signed)
 Maintain DASH diet Monitor BP at home and bring readings to next appointment. Alternate between flonase  and azelastine  nasal spray every 1week Stop afrin or any other nasal decongestant. Continue oral antihistamine

## 2023-08-31 NOTE — Assessment & Plan Note (Signed)
 Has upcoming appointment with Dr. Tyson-cardiology

## 2023-08-31 NOTE — Assessment & Plan Note (Addendum)
 Elevated BP possibly due to stress at home (husband with CVA) asymptomatic BP Readings from Last 3 Encounters:  08/31/23 (!) 140/82  08/11/23 126/84  03/27/23 138/80    Advised to maintain DASH diet and monitor BP at home F/up in 2-4weeks

## 2023-08-31 NOTE — Progress Notes (Unsigned)
 Established Patient Visit  Patient: Christina Huerta   DOB: 27-Apr-1961   63 y.o. Female  MRN: [id number] Visit Date: 08/31/2023  Subjective:    Chief Complaint  Patient presents with   Sinus Problem    Fullness and tenderness under left eye, hears water at times in left ear and has achy pain    Sinus Problem   Hypertriglyceridemia Has upcoming appointment with Dr. Tyson-cardiology  HTN (hypertension), benign Elevated BP possibly due to stress at home (husband with CVA) asymptomatic BP Readings from Last 3 Encounters:  08/31/23 (!) 140/82  08/11/23 126/84  03/27/23 138/80    Advised to maintain DASH diet and monitor BP at home F/up in 2-4weeks  Chronic recurrent sinusitis Eval by [facility] 08/2019: treated with cleocin, CT sinus recommended. Recent completion of augmentin  x 10days for acute on chronic sinusitis.  Previous use of allegra and zyrtec -no improvement Current use of chlor-trimeton 4mg  otc with some relief.  Start flonase  and azelastine . Alternate every1week Get CT cinus F/up in 2-4weeks   Reviewed medical, surgical, and social history today  Medications: Outpatient Medications Prior to Visit  Medication Sig   albuterol  (VENTOLIN  HFA) 108 (90 Base) MCG/ACT inhaler Inhale 1-2 puffs into the lungs every 6 (six) hours as needed.   citalopram  (CELEXA ) 20 MG tablet Take 1 tablet (20 mg total) by mouth daily.   erythromycin ophthalmic ointment Place 1 Application into both eyes as needed.   Evening Primrose Oil 500 MG CAPS Take by mouth.   MAGNESIUM PO Take by mouth.   meloxicam  (MOBIC ) 15 MG tablet Take 1 tablet (15 mg total) by mouth daily. With food   Moringa Oleifera (MORINGA PO) Take 8,000 mg by mouth.   Multiple Vitamins-Minerals (MULTIVITAMIN ADULT PO) Take by mouth.   OVER THE COUNTER MEDICATION Vinegar Tab   VITAMIN E PO Take by mouth.   amoxicillin -clavulanate (AUGMENTIN ) 875-125 MG tablet Take 1 tablet by mouth 2 (two) times daily.  (Patient not taking: Reported on 08/31/2023)   Emu Oil OIL Apply topically.   omega-3 acid ethyl esters (LOVAZA ) 1 g capsule Take 2 capsules (2 g total) by mouth 2 (two) times daily. (Patient not taking: Reported on 08/31/2023)   triamcinolone  cream (KENALOG ) 0.1 % Apply 1 Application topically 2 (two) times daily. (Patient not taking: Reported on 08/31/2023)   No facility-administered medications prior to visit.   Reviewed past medical and social history.   ROS per HPI above  {Show previous labs (optional):23779}    Objective:  BP (!) 140/82 (BP Location: Left Arm, Patient Position: Sitting, Cuff Size: Normal)   Pulse 88   Temp (!) 97.4 F (36.3 C) (Temporal)   Ht 5\' 9"  (1.753 m)   Wt 240 lb 12.8 oz (109.2 kg)   SpO2 96%   BMI 35.56 kg/m      Physical Exam Vitals and nursing note reviewed.  HENT:     Nose:     Right Nostril: No septal hematoma or occlusion.     Left Nostril: Occlusion present.     Right Turbinates: Not enlarged or swollen.     Left Turbinates: Enlarged and swollen.     Right Sinus: No maxillary sinus tenderness or frontal sinus tenderness.     Left Sinus: Maxillary sinus tenderness present. No frontal sinus tenderness.  Cardiovascular:     Rate and Rhythm: Normal rate and regular rhythm.     Pulses: Normal pulses.  Heart sounds: Normal heart sounds.  Pulmonary:     Effort: Pulmonary effort is normal.     Breath sounds: Normal breath sounds.  Neurological:     Mental Status: She is alert and oriented to person, place, and time.     No results found for any visits on 08/31/23.    Assessment & Plan:    Problem List Items Addressed This Visit     Chronic recurrent sinusitis - Primary   Eval by [facility] 08/2019: treated with cleocin, CT sinus recommended. Recent completion of augmentin  x 10days for acute on chronic sinusitis.  Previous use of allegra and zyrtec -no improvement Current use of chlor-trimeton 4mg  otc with some relief.  Start flonase   and azelastine . Alternate every1week Get CT cinus F/up in 2-4weeks      Relevant Medications   fluticasone  (FLONASE ) 50 MCG/ACT nasal spray   azelastine  (ASTELIN ) 0.1 % nasal spray   Other Relevant Orders   CT SINUS WO CONTRAST   HTN (hypertension), benign   Elevated BP possibly due to stress at home (husband with CVA) asymptomatic BP Readings from Last 3 Encounters:  08/31/23 (!) 140/82  08/11/23 126/84  03/27/23 138/80    Advised to maintain DASH diet and monitor BP at home F/up in 2-4weeks      Hypertriglyceridemia   Has upcoming appointment with Dr. Tyson-cardiology      Return in about 2 weeks (around 09/14/2023) for HTN.     Kathrene Parents, NP

## 2023-08-31 NOTE — Assessment & Plan Note (Addendum)
 Eval by [facility] 08/2019: treated with cleocin, CT sinus recommended. Recent completion of augmentin  x 10days for acute on chronic sinusitis.  Previous use of allegra and zyrtec -no improvement Current use of chlor-trimeton 4mg  otc with some relief.  Start flonase  and azelastine . Alternate every1week Get CT cinus F/up in 2-4weeks

## 2023-09-02 ENCOUNTER — Encounter: Payer: Self-pay | Admitting: Nurse Practitioner

## 2023-09-07 ENCOUNTER — Encounter (HOSPITAL_COMMUNITY): Payer: Self-pay

## 2023-09-08 ENCOUNTER — Ambulatory Visit

## 2023-09-08 DIAGNOSIS — J329 Chronic sinusitis, unspecified: Secondary | ICD-10-CM | POA: Diagnosis not present

## 2023-09-08 DIAGNOSIS — J3489 Other specified disorders of nose and nasal sinuses: Secondary | ICD-10-CM | POA: Diagnosis not present

## 2023-09-12 ENCOUNTER — Encounter: Payer: Self-pay | Admitting: Nurse Practitioner

## 2023-09-12 ENCOUNTER — Ambulatory Visit: Admitting: Nurse Practitioner

## 2023-09-12 ENCOUNTER — Ambulatory Visit: Payer: Self-pay | Admitting: Nurse Practitioner

## 2023-09-12 VITALS — BP 150/92 | HR 88 | Temp 96.4°F | Ht 69.0 in | Wt 238.4 lb

## 2023-09-12 DIAGNOSIS — J329 Chronic sinusitis, unspecified: Secondary | ICD-10-CM

## 2023-09-12 DIAGNOSIS — I10 Essential (primary) hypertension: Secondary | ICD-10-CM

## 2023-09-12 MED ORDER — PREDNISONE 10 MG (21) PO TBPK: ORAL_TABLET | ORAL | 0 refills | Status: DC

## 2023-09-12 MED ORDER — DOXYCYCLINE HYCLATE 100 MG PO TABS: 100.0000 mg | ORAL_TABLET | Freq: Two times a day (BID) | ORAL | 0 refills | Status: DC

## 2023-09-12 MED ORDER — AMLODIPINE BESYLATE 10 MG PO TABS: 10.0000 mg | ORAL_TABLET | Freq: Every evening | ORAL | 5 refills | Status: DC

## 2023-09-12 MED ORDER — MOXIFLOXACIN HCL 400 MG PO TABS
400.0000 mg | ORAL_TABLET | Freq: Every day | ORAL | 0 refills | Status: DC
Start: 2023-09-12 — End: 2023-11-16

## 2023-09-12 NOTE — Telephone Encounter (Addendum)
-  Called and left a voice message asking patient to give me a call back at the office at [phone]. I will try calling again.  ----- Message from Kathrene Parents sent at 09/12/2023  2:47 PM EDT ----- No sign of an abscess or inflammation. Do not take oral prednisone  or oral antibiotics Maintain flonase  and azelastine  nasal spray. Use ibuprofen or tylenol  for headache-

## 2023-09-12 NOTE — Assessment & Plan Note (Signed)
 Persistent elevated BP, asymptomatic BP Readings from Last 3 Encounters:  09/12/23 (!) 150/92  08/31/23 (!) 140/82  08/11/23 126/84    Start amlodipine 10mg  in PM Advised to maintain DASH diet F/up in 40month

## 2023-09-12 NOTE — Progress Notes (Signed)
 Established Patient Visit  Patient: Christina Huerta   DOB: January 04, 1961   62 y.o. Female  MRN: [id number] Visit Date: 09/12/2023  Subjective:     Chief Complaint  Patient presents with   Follow-up    Sinus headaches on and off couple months feels like teeth are being pushed down sometimes and sometimes in center of forehead    HPI HTN (hypertension), benign Persistent elevated BP, asymptomatic BP Readings from Last 3 Encounters:  09/12/23 (!) 150/92  08/31/23 (!) 140/82  08/11/23 126/84    Start amlodipine 10mg  in PM Advised to maintain DASH diet F/up in 13month  Chronic recurrent sinusitis Persistent left maxillary sinus press and pain, associated with loss of smell and headache. No fever. Minimal improvement with flonase  and azelastine . Pending CT sinus report. Sent prednisone  dose pack and avelox   Reviewed medical, surgical, and social history today  Medications: Outpatient Medications Prior to Visit  Medication Sig   albuterol  (VENTOLIN  HFA) 108 (90 Base) MCG/ACT inhaler Inhale 1-2 puffs into the lungs every 6 (six) hours as needed.   amoxicillin -clavulanate (AUGMENTIN ) 875-125 MG tablet Take 1 tablet by mouth 2 (two) times daily.   azelastine  (ASTELIN ) 0.1 % nasal spray Place 1 spray into both nostrils 2 (two) times daily.   citalopram  (CELEXA ) 20 MG tablet Take 1 tablet (20 mg total) by mouth daily.   Emu Oil OIL Apply topically.   erythromycin ophthalmic ointment Place 1 Application into both eyes as needed.   Evening Primrose Oil 500 MG CAPS Take by mouth.   fluticasone  (FLONASE ) 50 MCG/ACT nasal spray Place 2 sprays into both nostrils daily.   MAGNESIUM PO Take by mouth.   meloxicam  (MOBIC ) 15 MG tablet Take 1 tablet (15 mg total) by mouth daily. With food   Moringa Oleifera (MORINGA PO) Take 8,000 mg by mouth.   Multiple Vitamins-Minerals (MULTIVITAMIN ADULT PO) Take by mouth.   omega-3 acid ethyl esters (LOVAZA ) 1 g capsule Take 2 capsules (2 g  total) by mouth 2 (two) times daily.   OVER THE COUNTER MEDICATION Vinegar Tab   triamcinolone  cream (KENALOG ) 0.1 % Apply 1 Application topically 2 (two) times daily.   VITAMIN E PO Take by mouth.   No facility-administered medications prior to visit.   Reviewed past medical and social history.   ROS per HPI above  Last CBC Lab Results  Component Value Date   WBC 5.7 01/14/2019   HGB 14.8 01/14/2019   HCT 42.3 01/14/2019   MCV 93.3 01/14/2019   RDW 13.1 01/14/2019   PLT 355.0 01/14/2019   Last metabolic panel Lab Results  Component Value Date   GLUCOSE 97 03/22/2023   NA 138 03/22/2023   K 4.4 03/22/2023   CL 100 03/22/2023   CO2 31 03/22/2023   BUN 12 03/22/2023   CREATININE 0.57 03/22/2023   GFR 98.90 03/22/2023   CALCIUM 9.6 03/22/2023   PROT 7.2 03/22/2023   ALBUMIN 4.6 03/22/2023   BILITOT 0.8 03/22/2023   ALKPHOS 73 03/22/2023   AST 16 03/22/2023   ALT 21 03/22/2023   Last lipids Lab Results  Component Value Date   CHOL 292 (H) 03/22/2023   HDL 46.50 03/22/2023   LDLCALC 154 (H) 01/24/2020   LDLDIRECT 141.0 03/22/2023   TRIG (H) 03/22/2023    496.0 Triglyceride is over 400; calculations on Lipids are invalid.   CHOLHDL 6 03/22/2023   Last hemoglobin A1c Lab  Results  Component Value Date   HGBA1C 5.6 08/27/2021   Last thyroid  functions Lab Results  Component Value Date   TSH 2.54 08/27/2021        Objective:  BP (!) 150/92 (BP Location: Left Arm, Patient Position: Supine, Cuff Size: Normal)   Pulse 88   Temp (!) 96.4 F (35.8 C) (Temporal)   Ht 5\' 9"  (1.753 m)   Wt 238 lb 6.4 oz (108.1 kg)   BMI 35.21 kg/m      Physical Exam Vitals and nursing note reviewed.  Cardiovascular:     Rate and Rhythm: Normal rate and regular rhythm.     Pulses: Normal pulses.     Heart sounds: Normal heart sounds.  Pulmonary:     Effort: Pulmonary effort is normal.     Breath sounds: Normal breath sounds.  Musculoskeletal:     Right lower leg: No  edema.     Left lower leg: No edema.  Neurological:     Mental Status: She is alert and oriented to person, place, and time.     No results found for any visits on 09/12/23.    Assessment & Plan:    Problem List Items Addressed This Visit     Chronic recurrent sinusitis   Persistent left maxillary sinus press and pain, associated with loss of smell and headache. No fever. Minimal improvement with flonase  and azelastine . Pending CT sinus report. Sent prednisone  dose pack and avelox      Relevant Medications   predniSONE  (STERAPRED UNI-PAK 21 TAB) 10 MG (21) TBPK tablet   moxifloxacin (AVELOX) 400 MG tablet   HTN (hypertension), benign - Primary   Persistent elevated BP, asymptomatic BP Readings from Last 3 Encounters:  09/12/23 (!) 150/92  08/31/23 (!) 140/82  08/11/23 126/84    Start amlodipine 10mg  in PM Advised to maintain DASH diet F/up in 49month      Relevant Medications   amLODipine (NORVASC) 10 MG tablet   Return in about 4 weeks (around 10/10/2023) for HTN (fasting for labs) get ECG.     Kathrene Parents, NP

## 2023-09-12 NOTE — Assessment & Plan Note (Signed)
 Persistent left maxillary sinus press and pain, associated with loss of smell and headache. No fever. Minimal improvement with flonase  and azelastine . Pending CT sinus report. Sent prednisone  dose pack and avelox

## 2023-09-12 NOTE — Patient Instructions (Signed)

## 2023-09-14 ENCOUNTER — Ambulatory Visit: Admitting: Nurse Practitioner

## 2023-09-22 ENCOUNTER — Telehealth: Payer: Self-pay

## 2023-09-22 DIAGNOSIS — I10 Essential (primary) hypertension: Secondary | ICD-10-CM

## 2023-09-22 MED ORDER — OLMESARTAN MEDOXOMIL 20 MG PO TABS: 20.0000 mg | ORAL_TABLET | Freq: Every day | ORAL | 5 refills | Status: AC

## 2023-09-22 NOTE — Telephone Encounter (Signed)
 Patient called into the office and wanted to let Soyla Duverney know that since starting the Amlodipine  10 mg she has been experiencing bilateral swelling of feet and legs with last night being the worse where she could not get her shoes off. Discussed with Soyla Duverney and she stated to have patient stop Amlodipine  and she will send in a new medtication for her to take. Patient also asked what is her blood pressure goal. I was informed by Soyla Duverney that the goal is less than 130/80. Patient stated that her blood pressure readings at home have been 120 something over 70 something.

## 2023-09-22 NOTE — Assessment & Plan Note (Signed)
 Switch amlodipien 10mg  to olmesartan 20mg  due to LE edema

## 2023-09-22 NOTE — Addendum Note (Signed)
 Addended by: Lovey Rudd on: 09/22/2023 03:38 PM   Modules accepted: Orders

## 2023-09-25 ENCOUNTER — Encounter: Payer: Self-pay | Admitting: Nurse Practitioner

## 2023-10-04 ENCOUNTER — Ambulatory Visit: Admitting: Nurse Practitioner

## 2023-10-06 DIAGNOSIS — I700 Atherosclerosis of aorta: Secondary | ICD-10-CM | POA: Diagnosis not present

## 2023-10-06 DIAGNOSIS — I10 Essential (primary) hypertension: Secondary | ICD-10-CM | POA: Diagnosis not present

## 2023-10-06 DIAGNOSIS — E781 Pure hyperglyceridemia: Secondary | ICD-10-CM | POA: Diagnosis not present

## 2023-10-06 DIAGNOSIS — E782 Mixed hyperlipidemia: Secondary | ICD-10-CM | POA: Diagnosis not present

## 2023-11-13 DIAGNOSIS — I10 Essential (primary) hypertension: Secondary | ICD-10-CM | POA: Diagnosis not present

## 2023-11-13 DIAGNOSIS — S83282D Other tear of lateral meniscus, current injury, left knee, subsequent encounter: Secondary | ICD-10-CM | POA: Diagnosis not present

## 2023-11-16 ENCOUNTER — Encounter: Payer: Self-pay | Admitting: Internal Medicine

## 2023-11-16 ENCOUNTER — Ambulatory Visit: Admitting: Internal Medicine

## 2023-11-16 VITALS — BP 136/82 | HR 84 | Temp 98.0°F | Ht 69.0 in | Wt 237.6 lb

## 2023-11-16 DIAGNOSIS — I10 Essential (primary) hypertension: Secondary | ICD-10-CM | POA: Diagnosis not present

## 2023-11-16 DIAGNOSIS — R519 Headache, unspecified: Secondary | ICD-10-CM

## 2023-11-16 DIAGNOSIS — Z889 Allergy status to unspecified drugs, medicaments and biological substances status: Secondary | ICD-10-CM

## 2023-11-16 LAB — BASIC METABOLIC PANEL WITH GFR
BUN: 25 mg/dL — ABNORMAL HIGH (ref 6–23)
CO2: 28 meq/L (ref 19–32)
Calcium: 9.3 mg/dL (ref 8.4–10.5)
Chloride: 104 meq/L (ref 96–112)
Creatinine, Ser: 0.63 mg/dL (ref 0.40–1.20)
GFR: 96.1 mL/min (ref >60.00)
Glucose, Bld: 116 mg/dL — ABNORMAL HIGH (ref 70–99)
Potassium: 3.8 meq/L (ref 3.5–5.1)
Sodium: 138 meq/L (ref 135–145)

## 2023-11-16 LAB — POCT GLUCOSE (DEVICE FOR HOME USE): POC Glucose: 113 mg/dL — AB (ref 70–99)

## 2023-11-16 NOTE — Progress Notes (Signed)
 [facility] [address] [place] [address] Dept: [phone] Dept Fax: [phone]  Acute Care Office Visit  Subjective:   Christina Huerta 11-14-1960 11/16/2023  Chief Complaint  Patient presents with   Allergic Reaction    From cortisone injection  Headaches and dizziness     HPI:  Discussed the use of AI scribe software for clinical note transcription with the patient, who gave verbal consent to proceed.  History of Present Illness   Christina Huerta is a 63 year old female with hypertension who presents with dizziness and headache following a cortisone injection in left knee for torn meniscus. Needs surgery in future for repair, but patient has deferred surgery at this time.   She received a cortisone injection on Monday and experienced dizziness and near syncope about 15-20 minutes after the injection. She was observed for two hours at orthopedic office due to dizziness, nausea, and near syncope.  Since the injection, she has been experiencing persistent headaches described as feeling like 'somebody's got a skullcap on me'. She manages the headache with Tylenol , ibuprofen, and ice packs, which provide temporary relief.  She has a history of hypertension and is currently on medication for it. Currently takes Olmesartan  20mg  PO daily.   She reports numbness on the left arm, particularly from the tip of her fingers to the top of her shoulder, which occurred on Tuesday and lasted for a couple of hours. This was associated with the headache, but numbness has since resolved.     The following portions of the patient's history were reviewed and updated as appropriate: past medical history, past surgical history, family history, social history, allergies, medications, and problem list.   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   Hypertriglyceridemia 03/03/2017   Right knee pain 12/16/2016   Fracture of radial neck, right, closed 11/23/2016   Hyperglycemia 07/26/2016   Depression, recurrent (HCC) 07/26/2016   Hx of rheumatoid arthritis 07/25/2016   Allergic rhinitis 07/25/2016   Past Medical History:  Diagnosis Date   Allergy    Arthritis    Asthma    Depression    Hypertriglyceridemia 03/03/2017   IBS (irritable bowel syndrome)    Kidney stone    Meniscal injury, right, sequela 03/28/2017   Migraines    Past Surgical History:  Procedure Laterality Date   CESAREAN SECTION  1996   scar neunoma surgery  1998   Family History  Problem Relation Age of Onset   Hyperlipidemia Mother    Hyperlipidemia Father    Heart disease Father    Hypertension Father    Kidney disease Father    Arthritis Maternal Grandmother    Hyperlipidemia Maternal Grandmother    Cancer Maternal Grandfather        prostate cancer   Hyperlipidemia Maternal Grandfather    Arthritis Paternal Grandmother    Hyperlipidemia Paternal Grandmother    Cancer Paternal Grandfather        colon cancer   Hyperlipidemia Paternal Grandfather    Heart disease  Paternal Grandfather    Stroke Paternal Grandfather    Hypertension Paternal Grandfather     Current Outpatient Medications:    albuterol  (VENTOLIN  HFA) 108 (90 Base) MCG/ACT inhaler, Inhale 1-2 puffs into the lungs every 6 (six) hours as needed., Disp: 1 each, Rfl: 0   azelastine  (ASTELIN ) 0.1 % nasal spray, Place 1 spray into both nostrils 2 (two) times daily., Disp: 30 mL, Rfl: 5   citalopram  (CELEXA ) 20 MG tablet, Take  1 tablet (20 mg total) by mouth daily., Disp: 90 tablet, Rfl: 3   Emu Oil OIL, Apply topically., Disp: , Rfl:    Evening Primrose Oil 500 MG CAPS, Take by mouth., Disp: , Rfl:    MAGNESIUM PO, Take by mouth., Disp: , Rfl:    meloxicam  (MOBIC ) 15 MG tablet, Take 1 tablet (15 mg total) by mouth daily. With food, Disp: 90 tablet, Rfl: 1   Moringa Oleifera (MORINGA PO), Take 8,000 mg by mouth., Disp: , Rfl:    Multiple Vitamins-Minerals (MULTIVITAMIN ADULT PO), Take by mouth., Disp: , Rfl:    olmesartan  (BENICAR ) 20 MG tablet, Take 1 tablet (20 mg total) by mouth daily., Disp: 30 tablet, Rfl: 5   omega-3 acid ethyl esters (LOVAZA ) 1 g capsule, Take 2 capsules (2 g total) by mouth 2 (two) times daily., Disp: 120 capsule, Rfl: 5   VITAMIN E PO, Take by mouth., Disp: , Rfl:    fluticasone  (FLONASE ) 50 MCG/ACT nasal spray, Place 2 sprays into both nostrils daily. (Patient not taking: Reported on 11/16/2023), Disp: 16 g, Rfl: 5   OVER THE COUNTER MEDICATION, Vinegar Tab, Disp: , Rfl:    triamcinolone  cream (KENALOG ) 0.1 %, Apply 1 Application topically 2 (two) times daily. (Patient not taking: Reported on 11/16/2023), Disp: 30 g, Rfl: 0 Allergies  Allergen Reactions   Trimetrexate Anaphylaxis   Latex Other (See Comments) and Rash    Break out water welts   Sumatriptan Other (See Comments) and Rash    Face swelling,break out,red.  Only IM   Amlodipine  Swelling    LE edema   Astemizole Nausea Only   Oxycodone  Rash     ROS: A complete ROS was performed with pertinent positives/negatives noted in the HPI. The remainder of the ROS are negative.    Objective:   Today's Vitals   11/16/23 0941  BP: 136/82  Pulse: 84  Temp: 98 F (36.7 C)  TempSrc: Temporal  SpO2: 97%  Weight: 237 lb 9.6 oz (107.8 kg)  Height: 5' 9 (1.753 m)    GENERAL: Well-appearing, in NAD. Well nourished.  SKIN: Pink, warm and dry. No rash. NECK: Trachea midline. Full ROM w/o pain or tenderness. No lymphadenopathy.   RESPIRATORY: Chest wall symmetrical. Respirations even and non-labored. Breath sounds clear to auscultation bilaterally.  CARDIAC: S1, S2 present, regular rate and rhythm. Peripheral pulses 2+ bilaterally.  MSK: Muscle tone and strength appropriate for age.  EXTREMITIES: Without clubbing, cyanosis, or edema.  NEUROLOGIC: CN 2 -12 intact. No motor or sensory deficits. No slurred speech or facial droop. Steady, even gait.  PSYCH/MENTAL STATUS: Alert, oriented x 3. Cooperative, appropriate mood and affect.    Results for orders placed or performed in visit on 11/16/23  POCT Glucose (Device for Home Use)  Result Value Ref Range   Glucose Fasting, POC     POC Glucose 113 (A) 70 - 99 mg/dl      Assessment & Plan:  Assessment and Plan    Adverse reaction to corticosteroid injection /  Acute Headache Adverse reaction likely due to steroid injection, possibly related to dosage or placement of injection.  No significant motor deficits or vision changes, reducing concern for stroke. - Continue acetaminophen  for headache management. - Use ice packs for symptomatic relief. - Seek emergency care if symptoms persist. - POC glucometer  Hypertension Hypertension well-managed with current medication. Blood pressure is 136/82. - Continue current antihypertensive medication. - Monitor blood pressure regularly. - BMP       No orders of the defined types were placed in this encounter.  Orders Placed This Encounter  Procedures   Basic Metabolic Panel (BMET)   POCT Glucose (Device for Home Use)   Lab Orders         Basic Metabolic Panel (BMET)         POCT Glucose (Device for Home Use)     No images are attached to the encounter or orders placed in the encounter.  Return if symptoms worsen or fail to improve.   Rosina Senters, FNP

## 2023-11-17 ENCOUNTER — Ambulatory Visit: Payer: Self-pay | Admitting: Nurse Practitioner

## 2023-12-26 DIAGNOSIS — S83282A Other tear of lateral meniscus, current injury, left knee, initial encounter: Secondary | ICD-10-CM | POA: Insufficient documentation

## 2024-01-03 DIAGNOSIS — E782 Mixed hyperlipidemia: Secondary | ICD-10-CM | POA: Diagnosis not present

## 2024-01-09 DIAGNOSIS — E781 Pure hyperglyceridemia: Secondary | ICD-10-CM | POA: Diagnosis not present

## 2024-01-09 DIAGNOSIS — E782 Mixed hyperlipidemia: Secondary | ICD-10-CM | POA: Diagnosis not present

## 2024-01-09 DIAGNOSIS — I10 Essential (primary) hypertension: Secondary | ICD-10-CM | POA: Diagnosis not present

## 2024-01-09 DIAGNOSIS — Z789 Other specified health status: Secondary | ICD-10-CM | POA: Diagnosis not present

## 2024-01-10 ENCOUNTER — Encounter: Payer: Self-pay | Admitting: Nurse Practitioner

## 2024-01-10 ENCOUNTER — Telehealth: Payer: Self-pay | Admitting: Nurse Practitioner

## 2024-01-10 ENCOUNTER — Ambulatory Visit: Admitting: Nurse Practitioner

## 2024-01-10 VITALS — BP 122/80 | HR 87 | Temp 98.4°F | Ht 69.0 in | Wt 241.6 lb

## 2024-01-10 DIAGNOSIS — K5900 Constipation, unspecified: Secondary | ICD-10-CM

## 2024-01-10 DIAGNOSIS — R350 Frequency of micturition: Secondary | ICD-10-CM | POA: Diagnosis not present

## 2024-01-10 LAB — POCT URINALYSIS DIPSTICK
Bilirubin, UA: NEGATIVE
Blood, UA: NEGATIVE
Glucose, UA: NEGATIVE
Ketones, UA: NEGATIVE
Leukocytes, UA: NEGATIVE
Nitrite, UA: NEGATIVE
Protein, UA: POSITIVE — AB
Spec Grav, UA: >=1.03 — AB (ref 1.010–1.025)
Urobilinogen, UA: 0.2 U/dL
pH, UA: 6 (ref 5.0–8.0)

## 2024-01-10 MED ORDER — POLYETHYLENE GLYCOL 3350 17 GM/SCOOP PO POWD: 17.0000 g | Freq: Every day | ORAL | Status: AC

## 2024-01-10 MED ORDER — AMOXICILLIN-POT CLAVULANATE 875-125 MG PO TABS: 1.0000 | ORAL_TABLET | Freq: Two times a day (BID) | ORAL | 0 refills | Status: DC

## 2024-01-10 NOTE — Progress Notes (Signed)
 Acute Office Visit  Subjective:    Patient ID: Christina Huerta, female    DOB: Jul 12, 1960, 63 y.o.   MRN: [id number]  Chief Complaint  Patient presents with   Urinary Frequency    4-5 times a hour very little volume,lower back pain    Urinary Frequency  This is a new problem. The current episode started 1 to 4 weeks ago. The problem occurs every urination. The problem has been unchanged. The quality of the pain is described as burning. The pain is moderate. There has been no fever. She is Not sexually active. There is No history of pyelonephritis. Associated symptoms include frequency. Pertinent negatives include no chills, discharge, flank pain, hematuria, hesitancy, nausea, possible pregnancy, sweats, urgency or vomiting. She has tried nothing for the symptoms. Her past medical history is significant for kidney stones. There is no history of catheterization, recurrent UTIs, a single kidney, urinary stasis or a urological procedure.  Constipation This is a chronic problem. The current episode started more than 1 year ago. The problem has been waxing and waning since onset. Her stool frequency is 1 time per day. The stool is described as pellet like. The patient is not on a high fiber diet. She Does not exercise regularly. There has Not been adequate water intake. Associated symptoms include abdominal pain and back pain. Pertinent negatives include no diarrhea, difficulty urinating, fecal incontinence, fever, flatus, hematochezia, hemorrhoids, melena, nausea, rectal pain, vomiting or weight loss. Risk factors include obesity. She has tried laxatives for the symptoms. The treatment provided moderate relief.   Outpatient Medications Prior to Visit  Medication Sig   albuterol  (VENTOLIN  HFA) 108 (90 Base) MCG/ACT inhaler Inhale 1-2 puffs into the lungs every 6 (six) hours as needed.   BLACK COHOSH PO Take 700 mg by mouth daily.   citalopram  (CELEXA ) 20 MG tablet Take 1 tablet (20 mg total) by mouth  daily.   Evening Primrose Oil 500 MG CAPS Take by mouth.   fluticasone  (FLONASE ) 50 MCG/ACT nasal spray Place 2 sprays into both nostrils daily.   MAGNESIUM PO Take by mouth.   meloxicam  (MOBIC ) 15 MG tablet Take 1 tablet (15 mg total) by mouth daily. With food   Moringa Oleifera (MORINGA PO) Take 8,000 mg by mouth.   Multiple Vitamins-Minerals (MULTIVITAMIN ADULT PO) Take by mouth.   olmesartan  (BENICAR ) 20 MG tablet Take 1 tablet (20 mg total) by mouth daily.   omega-3 acid ethyl esters (LOVAZA ) 1 g capsule Take 2 capsules (2 g total) by mouth 2 (two) times daily.   OVER THE COUNTER MEDICATION Vinegar Tab   VITAMIN E PO Take by mouth.   azelastine  (ASTELIN ) 0.1 % nasal spray Place 1 spray into both nostrils 2 (two) times daily. (Patient not taking: Reported on 01/10/2024)   Emu Oil OIL Apply topically. (Patient not taking: Reported on 01/10/2024)   triamcinolone  cream (KENALOG ) 0.1 % Apply 1 Application topically 2 (two) times daily. (Patient not taking: Reported on 11/16/2023)   No facility-administered medications prior to visit.    Reviewed past medical and social history.  Review of Systems  Constitutional:  Negative for chills, fever and weight loss.  Gastrointestinal:  Positive for abdominal pain and constipation. Negative for diarrhea, flatus, hematochezia, hemorrhoids, melena, nausea, rectal pain and vomiting.  Genitourinary:  Positive for frequency. Negative for difficulty urinating, flank pain, hematuria, hesitancy and urgency.  Musculoskeletal:  Positive for back pain.   Per HPI     Objective:    Physical Exam  Vitals and nursing note reviewed.  Cardiovascular:     Rate and Rhythm: Normal rate.     Pulses: Normal pulses.  Pulmonary:     Effort: Pulmonary effort is normal.  Abdominal:     Palpations: Abdomen is soft.     Tenderness: There is abdominal tenderness in the left lower quadrant. There is no right CVA tenderness, left CVA tenderness or guarding.  Neurological:      Mental Status: She is alert.    BP 122/80   Pulse 87   Temp 98.4 F (36.9 C) (Temporal)   Ht 5' 9 (1.753 m)   Wt 241 lb 9.6 oz (109.6 kg)   SpO2 96%   BMI 35.68 kg/m    Results for orders placed or performed in visit on 01/10/24  POCT urinalysis dipstick  Result Value Ref Range   Color, UA     Clarity, UA     Glucose, UA Negative Negative   Bilirubin, UA negative    Ketones, UA negative    Spec Grav, UA >=1.030 (A) 1.010 - 1.025   Blood, UA negative    pH, UA 6.0 5.0 - 8.0   Protein, UA Positive (A) Negative   Urobilinogen, UA 0.2 0.2 or 1.0 E.U./dL   Nitrite, UA negative    Leukocytes, UA Negative Negative   Appearance     Odor         Assessment & Plan:   Problem List Items Addressed This Visit   None Visit Diagnoses       Urinary frequency    -  Primary   Relevant Medications   amoxicillin -clavulanate (AUGMENTIN ) 875-125 MG tablet   Other Relevant Orders   POCT urinalysis dipstick (Completed)   Urine Culture     Constipation, unspecified constipation type       Relevant Medications   polyethylene glycol powder (GLYCOLAX /MIRALAX ) 17 GM/SCOOP powder      Meds ordered this encounter  Medications   amoxicillin -clavulanate (AUGMENTIN ) 875-125 MG tablet    Sig: Take 1 tablet by mouth 2 (two) times daily.    Dispense:  10 tablet    Refill:  0    Supervising Provider:   BERNETA FALLOW ALFRED [[id number]]   polyethylene glycol powder (GLYCOLAX /MIRALAX ) 17 GM/SCOOP powder    Sig: Take 17 g by mouth daily. Dissolve 1 capful (17g) in 4-8 ounces of liquid and take by mouth daily.    Supervising Provider:   BERNETA FALLOW SAYRE [[id number]]   Return if symptoms worsen or fail to improve.  Roselie Mood, NP

## 2024-01-10 NOTE — Patient Instructions (Addendum)
 Start miralax  daily and augmentin  Maintain adequate oral hydration Urine sent for culture

## 2024-01-10 NOTE — Telephone Encounter (Signed)
 Error

## 2024-01-10 NOTE — Telephone Encounter (Signed)
 Prescription Request  01/10/2024  LOV: 01/10/2024  What is the name of the medication or equipment? Fluconazole   Have you contacted your pharmacy to request a refill? No   Which pharmacy would you like this sent to? [facility] - [place], [place] - [address] [place] [address] Phone: [phone] Fax: [phone]   Patient notified that their request is being sent to the clinical staff for review and that they should receive a response within 2 business days.   Please advise at Mobile [phone] (mobile)

## 2024-01-11 LAB — URINE CULTURE
MICRO NUMBER:: 16949304
Result:: NO GROWTH
SPECIMEN QUALITY:: ADEQUATE

## 2024-01-11 NOTE — Telephone Encounter (Signed)
 Patient called into the office yesterday afternoon asking for the fluconazole . Per conversation with Roselie Mood, NP patient will need to take a probiotic while taking the antibiotic and if she develops a yeast infection she will need to cal the office and she will then prescribe the medication for patient.  Patient was advised of this and was not happy about it and said that she is due to go out of town and wanted to have the medication on hand. I informed her that if she does develop symptoms that she can call with the pharmacy that is in the town she will be in to have Rx sent there. She verbalized understanding and all (if any) questions were answered.

## 2024-01-12 ENCOUNTER — Ambulatory Visit: Payer: Self-pay | Admitting: Nurse Practitioner

## 2024-01-18 DIAGNOSIS — I10 Essential (primary) hypertension: Secondary | ICD-10-CM | POA: Diagnosis not present

## 2024-01-18 DIAGNOSIS — X58XXXA Exposure to other specified factors, initial encounter: Secondary | ICD-10-CM | POA: Diagnosis not present

## 2024-01-18 DIAGNOSIS — R0603 Acute respiratory distress: Secondary | ICD-10-CM | POA: Diagnosis not present

## 2024-01-18 DIAGNOSIS — S83282D Other tear of lateral meniscus, current injury, left knee, subsequent encounter: Secondary | ICD-10-CM | POA: Diagnosis not present

## 2024-01-18 DIAGNOSIS — M1712 Unilateral primary osteoarthritis, left knee: Secondary | ICD-10-CM | POA: Diagnosis not present

## 2024-01-18 DIAGNOSIS — Z91040 Latex allergy status: Secondary | ICD-10-CM | POA: Diagnosis not present

## 2024-01-18 DIAGNOSIS — Z9889 Other specified postprocedural states: Secondary | ICD-10-CM | POA: Insufficient documentation

## 2024-01-18 DIAGNOSIS — J45909 Unspecified asthma, uncomplicated: Secondary | ICD-10-CM | POA: Diagnosis not present

## 2024-01-18 DIAGNOSIS — M25562 Pain in left knee: Secondary | ICD-10-CM | POA: Diagnosis not present

## 2024-01-18 DIAGNOSIS — K219 Gastro-esophageal reflux disease without esophagitis: Secondary | ICD-10-CM | POA: Diagnosis not present

## 2024-01-18 DIAGNOSIS — E782 Mixed hyperlipidemia: Secondary | ICD-10-CM | POA: Diagnosis not present

## 2024-01-18 DIAGNOSIS — S83232A Complex tear of medial meniscus, current injury, left knee, initial encounter: Secondary | ICD-10-CM | POA: Diagnosis not present

## 2024-01-18 DIAGNOSIS — R2689 Other abnormalities of gait and mobility: Secondary | ICD-10-CM | POA: Diagnosis not present

## 2024-01-18 DIAGNOSIS — S83282A Other tear of lateral meniscus, current injury, left knee, initial encounter: Secondary | ICD-10-CM | POA: Diagnosis not present

## 2024-01-18 DIAGNOSIS — Z79899 Other long term (current) drug therapy: Secondary | ICD-10-CM | POA: Diagnosis not present

## 2024-01-18 DIAGNOSIS — Z6836 Body mass index (BMI) 36.0-36.9, adult: Secondary | ICD-10-CM | POA: Diagnosis not present

## 2024-01-18 DIAGNOSIS — S83262A Peripheral tear of lateral meniscus, current injury, left knee, initial encounter: Secondary | ICD-10-CM | POA: Diagnosis not present

## 2024-01-18 DIAGNOSIS — S83242A Other tear of medial meniscus, current injury, left knee, initial encounter: Secondary | ICD-10-CM | POA: Diagnosis not present

## 2024-01-18 DIAGNOSIS — R0902 Hypoxemia: Secondary | ICD-10-CM | POA: Diagnosis not present

## 2024-01-18 DIAGNOSIS — Z9189 Other specified personal risk factors, not elsewhere classified: Secondary | ICD-10-CM | POA: Diagnosis not present

## 2024-01-19 DIAGNOSIS — E782 Mixed hyperlipidemia: Secondary | ICD-10-CM | POA: Diagnosis not present

## 2024-01-19 DIAGNOSIS — Z91040 Latex allergy status: Secondary | ICD-10-CM | POA: Diagnosis not present

## 2024-01-19 DIAGNOSIS — Z6836 Body mass index (BMI) 36.0-36.9, adult: Secondary | ICD-10-CM | POA: Diagnosis not present

## 2024-01-19 DIAGNOSIS — Z79899 Other long term (current) drug therapy: Secondary | ICD-10-CM | POA: Diagnosis not present

## 2024-01-19 DIAGNOSIS — Z9189 Other specified personal risk factors, not elsewhere classified: Secondary | ICD-10-CM | POA: Diagnosis not present

## 2024-01-19 DIAGNOSIS — X58XXXA Exposure to other specified factors, initial encounter: Secondary | ICD-10-CM | POA: Diagnosis not present

## 2024-01-19 DIAGNOSIS — R0902 Hypoxemia: Secondary | ICD-10-CM | POA: Diagnosis not present

## 2024-01-19 DIAGNOSIS — M25562 Pain in left knee: Secondary | ICD-10-CM | POA: Diagnosis not present

## 2024-01-19 DIAGNOSIS — J45909 Unspecified asthma, uncomplicated: Secondary | ICD-10-CM | POA: Diagnosis not present

## 2024-01-19 DIAGNOSIS — K219 Gastro-esophageal reflux disease without esophagitis: Secondary | ICD-10-CM | POA: Diagnosis not present

## 2024-01-19 DIAGNOSIS — I10 Essential (primary) hypertension: Secondary | ICD-10-CM | POA: Diagnosis not present

## 2024-01-19 DIAGNOSIS — S83232A Complex tear of medial meniscus, current injury, left knee, initial encounter: Secondary | ICD-10-CM | POA: Diagnosis not present

## 2024-01-19 DIAGNOSIS — R2689 Other abnormalities of gait and mobility: Secondary | ICD-10-CM | POA: Diagnosis not present

## 2024-01-19 DIAGNOSIS — S83282A Other tear of lateral meniscus, current injury, left knee, initial encounter: Secondary | ICD-10-CM | POA: Diagnosis not present

## 2024-01-23 ENCOUNTER — Ambulatory Visit: Payer: Self-pay

## 2024-01-23 DIAGNOSIS — E7800 Pure hypercholesterolemia, unspecified: Secondary | ICD-10-CM | POA: Diagnosis not present

## 2024-01-23 DIAGNOSIS — R7989 Other specified abnormal findings of blood chemistry: Secondary | ICD-10-CM | POA: Diagnosis not present

## 2024-01-23 DIAGNOSIS — R079 Chest pain, unspecified: Secondary | ICD-10-CM | POA: Diagnosis not present

## 2024-01-23 DIAGNOSIS — R0789 Other chest pain: Secondary | ICD-10-CM | POA: Diagnosis not present

## 2024-01-23 DIAGNOSIS — J9801 Acute bronchospasm: Secondary | ICD-10-CM

## 2024-01-23 DIAGNOSIS — R072 Precordial pain: Secondary | ICD-10-CM | POA: Diagnosis not present

## 2024-01-23 DIAGNOSIS — Z91041 Radiographic dye allergy status: Secondary | ICD-10-CM | POA: Diagnosis not present

## 2024-01-23 DIAGNOSIS — J45909 Unspecified asthma, uncomplicated: Secondary | ICD-10-CM | POA: Diagnosis not present

## 2024-01-23 DIAGNOSIS — I10 Essential (primary) hypertension: Secondary | ICD-10-CM | POA: Diagnosis not present

## 2024-01-23 DIAGNOSIS — Z79899 Other long term (current) drug therapy: Secondary | ICD-10-CM | POA: Diagnosis not present

## 2024-01-23 DIAGNOSIS — Z9889 Other specified postprocedural states: Secondary | ICD-10-CM | POA: Diagnosis not present

## 2024-01-23 DIAGNOSIS — R0602 Shortness of breath: Secondary | ICD-10-CM | POA: Diagnosis not present

## 2024-01-23 DIAGNOSIS — R000 Tachycardia, unspecified: Secondary | ICD-10-CM | POA: Diagnosis not present

## 2024-01-23 NOTE — Telephone Encounter (Signed)
 FYI Only or Action Required?: FYI only for provider.  Patient was last seen in primary care on 01/10/2024 by Nche, Roselie Rockford, NP.  Called Nurse Triage reporting Breathing Problem.  Symptoms began yesterday.  Symptoms are: somewhat improved.  Triage Disposition: Go to ED Now (Notify PCP)  Patient/caregiver understands and will follow disposition?:      Copied from CRM [id number]. Topic: Clinical - Red Word Triage >> Jan 23, 2024  9:33 AM Thersia BROCKS wrote: [facility] that prompted transfer to Nurse Triage: Patient called in stated her oxygen level is dropping, has a very bad headache and dizzy,       Reason for Disposition  Oxygen level (e.g., pulse oximetry) 90% or lower  Answer Assessment - Initial Assessment Questions Patient reports her oxygen level was in the 80's overnight. She states her oxygen level is now 92% but is experiencing headache and dizziness. Patient advised to go to the ED for evaluation.     1. MAIN CONCERN OR SYMPTOM: What's your main concern? (e.g., low oxygen level, breathing difficulty) What question do you have?     Oxygen level dropping  2. ONSET: When did the low oxygen level start?      Last night 3. OXYGEN THERAPY:      No 4.  OXYGEN EQUIPMENT:  Are you having any trouble with your oxygen equipment?  (e.g., cannula, mask, tubing, tank, concentrator)     No 5. OXYGEN SATURATION MONITOR:       Yes 6. OXYGEN LEVEL: What is your reading (oxygen level) today? What is your usual oxygen saturation reading? (e.g., 95%)     92% today, 80's  7. VSS MONITORING: Do you monitor/measure your oxygen level or vital signs? (e.g., yes, no, measurements are automatically sent to provider/call center). Document CURRENT and NORMAL BASELINE values if available.       Yes, 92% 8. BREATHING DIFFICULTY: Are you having any difficulty breathing? If Yes, ask: How bad is it?  (e.g., none, mild, moderate, severe)      No 9. OTHER SYMPTOMS: Do you have  any other symptoms? (e.g., fever, change in sputum)     Headache, dizziness  Protocols used: Oxygen Monitoring and Hypoxia-A-AH

## 2024-01-24 MED ORDER — ALBUTEROL SULFATE (2.5 MG/3ML) 0.083% IN NEBU: 2.5000 mg | INHALATION_SOLUTION | Freq: Four times a day (QID) | RESPIRATORY_TRACT | 0 refills | Status: AC | PRN

## 2024-01-24 MED ORDER — ALBUTEROL SULFATE (2.5 MG/3ML) 0.083% IN NEBU: 2.5000 mg | INHALATION_SOLUTION | Freq: Four times a day (QID) | RESPIRATORY_TRACT | 0 refills | Status: DC | PRN

## 2024-01-24 NOTE — Telephone Encounter (Signed)
 Based on ED notes, albuterol  inhaler prescription was sent. I sent albuterol  for nebulizer machine.  Advise not to use within 2hrs of albuterol  inhaler Maintain upcoming appointment.

## 2024-01-24 NOTE — Addendum Note (Signed)
 Addended by: KATHEEN GARDEN L on: 01/24/2024 12:46 PM   Modules accepted: Orders

## 2024-01-24 NOTE — Telephone Encounter (Signed)
 Called patient to ask how she is feeling. She informed me that she went to the ED yesterday late morning/early afternoon. She stated that she is feeling weaker today. She also stated that test were performed and was told to follow up with PCP and referrals were placed. She stated she was also told that she might have sleep apnea, and has some skipped beats with her heart. She asked if she could get a refill on her albuterol  inhaler and have a refill on the albuterol  nebulizer solution because she has a machine for it and not medication for it. I informed her that I will have to pass this the request to [facility] about the nebulizer and I can send a refill for the inhaler. She thanked me for calling

## 2024-01-24 NOTE — Addendum Note (Signed)
 Addended by: KATHEEN GARDEN L on: 01/24/2024 12:45 PM   Modules accepted: Orders

## 2024-01-26 ENCOUNTER — Ambulatory Visit: Admitting: Family Medicine

## 2024-01-26 ENCOUNTER — Ambulatory Visit: Attending: Nurse Practitioner

## 2024-01-26 ENCOUNTER — Encounter: Payer: Self-pay | Admitting: Nurse Practitioner

## 2024-01-26 ENCOUNTER — Ambulatory Visit: Admitting: Nurse Practitioner

## 2024-01-26 VITALS — BP 120/80 | HR 83 | Ht 69.0 in | Wt 245.6 lb

## 2024-01-26 VITALS — BP 148/80 | HR 90 | Ht 69.0 in | Wt 241.6 lb

## 2024-01-26 DIAGNOSIS — R0902 Hypoxemia: Secondary | ICD-10-CM

## 2024-01-26 DIAGNOSIS — J301 Allergic rhinitis due to pollen: Secondary | ICD-10-CM | POA: Diagnosis not present

## 2024-01-26 DIAGNOSIS — R079 Chest pain, unspecified: Secondary | ICD-10-CM | POA: Diagnosis not present

## 2024-01-26 DIAGNOSIS — R0681 Apnea, not elsewhere classified: Secondary | ICD-10-CM | POA: Diagnosis not present

## 2024-01-26 DIAGNOSIS — J4520 Mild intermittent asthma, uncomplicated: Secondary | ICD-10-CM

## 2024-01-26 DIAGNOSIS — R002 Palpitations: Secondary | ICD-10-CM | POA: Insufficient documentation

## 2024-01-26 DIAGNOSIS — R0683 Snoring: Secondary | ICD-10-CM | POA: Diagnosis not present

## 2024-01-26 LAB — TROPONIN T: Troponin T (Highly Sensitive): 6 ng/L (ref 0–14)

## 2024-01-26 MED ORDER — MONTELUKAST SODIUM 10 MG PO TABS: 10.0000 mg | ORAL_TABLET | Freq: Every day | ORAL | 1 refills | Status: AC

## 2024-01-26 NOTE — Progress Notes (Unsigned)
 EP to read.

## 2024-01-26 NOTE — Assessment & Plan Note (Addendum)
 Onset 5days after left knee surgery.-meniscus repair. Hypoxia noted during recovery and led to 1day admission. Describes as fluttering sensation in chest, accompanied by chest tightness and hypoxia. No specific trigger. Occurs 2-10x/day, last for about , resolves spontaneously. No syncope She was evaluation by ED provider on 01/23/2024: normal CXR and CT chest, CBC,CMP, Magnesium. States no improvement with albuterol  inhaler Husband reports loud snoring and apneic episodes at hs. She is under the care of [facility] cardiology-Dr. Lilian. She reports upcoming appointment for CT Cardiac coronary calcium score  Entered order for zio patch x 14days and referral to sleep specialist.

## 2024-01-26 NOTE — Patient Instructions (Signed)
 Avoid all OVER THE COUNTER NSAIDs while taking ketorolac Ok to take tylenol  between current pain meds.

## 2024-01-26 NOTE — Progress Notes (Signed)
 Established Patient Visit  Patient: Christina Huerta   DOB: Sep 19, 1960   63 y.o. Female  MRN: [id number] Visit Date: 01/26/2024  Subjective:    Chief Complaint  Patient presents with   Follow-up    Recent ED follow for SOB    HPI Intermittent palpitations Onset 5days after left knee surgery.-meniscus repair. Hypoxia noted during recovery and led to 1day admission. Describes as fluttering sensation in chest, accompanied by chest tightness and hypoxia. No specific trigger. Occurs 2-10x/day, last for about , resolves spontaneously. No syncope She was evaluation by ED provider on 01/23/2024: normal CXR and CT chest, CBC,CMP, Magnesium. States no improvement with albuterol  inhaler Husband reports loud snoring and apneic episodes at hs. She is under the care of [facility] cardiology-Dr. Lilian. She reports upcoming appointment for CT Cardiac coronary calcium score  Entered order for zio patch x 14days and referral to sleep specialist.  Reviewed medical, surgical, and social history today  Medications: Outpatient Medications Prior to Visit  Medication Sig   albuterol  (PROVENTIL ) (2.5 MG/3ML) 0.083% nebulizer solution Take 3 mLs (2.5 mg total) by nebulization every 6 (six) hours as needed for wheezing or shortness of breath.   albuterol  (VENTOLIN  HFA) 108 (90 Base) MCG/ACT inhaler Inhale 1-2 puffs into the lungs every 6 (six) hours as needed.   BLACK COHOSH PO Take 700 mg by mouth daily.   citalopram  (CELEXA ) 20 MG tablet Take 1 tablet (20 mg total) by mouth daily.   erythromycin ophthalmic ointment SMARTSIG:In Eye(s)   Evening Primrose Oil 500 MG CAPS Take by mouth.   Evolocumab 140 MG/ML SOAJ Inject 140 mg into the skin.   fluticasone  (FLONASE ) 50 MCG/ACT nasal spray Place 2 sprays into both nostrils daily.   ketorolac (TORADOL) 10 MG tablet Take 10 mg by mouth every 6 (six) hours as needed for moderate pain (pain score 4-6).   MAGNESIUM PO Take by mouth.   Moringa  Oleifera (MORINGA PO) Take 8,000 mg by mouth.   Multiple Vitamins-Minerals (MULTIVITAMIN ADULT PO) Take by mouth.   olmesartan  (BENICAR ) 20 MG tablet Take 1 tablet (20 mg total) by mouth daily.   omega-3 acid ethyl esters (LOVAZA ) 1 g capsule Take 2 capsules (2 g total) by mouth 2 (two) times daily.   OVER THE COUNTER MEDICATION Vinegar Tab   polyethylene glycol powder (GLYCOLAX /MIRALAX ) 17 GM/SCOOP powder Take 17 g by mouth daily. Dissolve 1 capful (17g) in 4-8 ounces of liquid and take by mouth daily.   rosuvastatin (CRESTOR) 40 MG tablet Take 40 mg by mouth daily.   traMADol  (ULTRAM ) 50 MG tablet Take 50 mg by mouth every 6 (six) hours as needed.   VITAMIN E PO Take by mouth.   [DISCONTINUED] amoxicillin -clavulanate (AUGMENTIN ) 875-125 MG tablet Take 1 tablet by mouth 2 (two) times daily.   [DISCONTINUED] meloxicam  (MOBIC ) 15 MG tablet Take 1 tablet (15 mg total) by mouth daily. With food   No facility-administered medications prior to visit.   Reviewed past medical and social history.   ROS per HPI above      Objective:  BP 120/80 (BP Location: Right Arm, Patient Position: Sitting, Cuff Size: Large)   Pulse 83   Ht 5' 9 (1.753 m)   Wt 245 lb 9.6 oz (111.4 kg)   SpO2 92%   BMI 36.27 kg/m      Physical Exam Vitals and nursing note reviewed.  Cardiovascular:  Rate and Rhythm: Normal rate and regular rhythm.     Pulses: Normal pulses.     Heart sounds: Normal heart sounds.  Pulmonary:     Effort: Pulmonary effort is normal.     Breath sounds: Normal breath sounds.  Musculoskeletal:        General: Swelling and tenderness present.     Right lower leg: No edema.     Left lower leg: Edema present.     Comments: Secondary to recent knee surgery  Neurological:     Mental Status: She is alert and oriented to person, place, and time.     No results found for any visits on 01/26/24.    Assessment & Plan:    Problem List Items Addressed This Visit     Allergic  rhinitis   Relevant Medications   montelukast  (SINGULAIR ) 10 MG tablet   Asthma   Relevant Medications   montelukast  (SINGULAIR ) 10 MG tablet   Intermittent palpitations - Primary   Onset 5days after left knee surgery.-meniscus repair. Hypoxia noted during recovery and led to 1day admission. Describes as fluttering sensation in chest, accompanied by chest tightness and hypoxia. No specific trigger. Occurs 2-10x/day, last for about , resolves spontaneously. No syncope She was evaluation by ED provider on 01/23/2024: normal CXR and CT chest, CBC,CMP, Magnesium. States no improvement with albuterol  inhaler Husband reports loud snoring and apneic episodes at hs. She is under the care of [facility] cardiology-Dr. Lilian. She reports upcoming appointment for CT Cardiac coronary calcium score  Entered order for zio patch x 14days and referral to sleep specialist.      Relevant Orders   LONG TERM MONITOR XT (3-14 DAYS)   Ambulatory referral to Sleep Studies   Other Visit Diagnoses       Loud snoring       Relevant Orders   Ambulatory referral to Sleep Studies     Witnessed apneic spells       Relevant Orders   Ambulatory referral to Sleep Studies     Hypoxia       Relevant Orders   Ambulatory referral to Sleep Studies      Return if symptoms worsen or fail to improve.     Roselie Mood, NP

## 2024-01-27 LAB — CMP14+EGFR
ALT: 20 IU/L (ref 0–32)
AST: 19 IU/L (ref 0–40)
Albumin: 4 g/dL (ref 3.9–4.9)
Alkaline Phosphatase: 79 IU/L (ref 49–135)
BUN/Creatinine Ratio: 33 — ABNORMAL HIGH (ref 12–28)
BUN: 20 mg/dL (ref 8–27)
Bilirubin Total: 0.7 mg/dL (ref 0.0–1.2)
CO2: 22 mmol/L (ref 20–29)
Calcium: 9.3 mg/dL (ref 8.7–10.3)
Chloride: 103 mmol/L (ref 96–106)
Creatinine, Ser: 0.61 mg/dL (ref 0.57–1.00)
Globulin, Total: 2.2 g/dL (ref 1.5–4.5)
Glucose: 119 mg/dL — ABNORMAL HIGH (ref 70–99)
Potassium: 4.1 mmol/L (ref 3.5–5.2)
Sodium: 141 mmol/L (ref 134–144)
Total Protein: 6.2 g/dL (ref 6.0–8.5)
eGFR: 102 mL/min/1.73 (ref >59)

## 2024-01-27 LAB — CBC WITH DIFFERENTIAL/PLATELET
Basophils Absolute: 0 x10E3/uL (ref 0.0–0.2)
Basos: 1 %
EOS (ABSOLUTE): 0.2 x10E3/uL (ref 0.0–0.4)
Eos: 2 %
Hematocrit: 37.3 % (ref 34.0–46.6)
Hemoglobin: 12.1 g/dL (ref 11.1–15.9)
Immature Grans (Abs): 0 x10E3/uL (ref 0.0–0.1)
Immature Granulocytes: 0 %
Lymphocytes Absolute: 1.7 x10E3/uL (ref 0.7–3.1)
Lymphs: 27 %
MCH: 31.3 pg (ref 26.6–33.0)
MCHC: 32.4 g/dL (ref 31.5–35.7)
MCV: 97 fL (ref 79–97)
Monocytes Absolute: 0.4 x10E3/uL (ref 0.1–0.9)
Monocytes: 7 %
Neutrophils Absolute: 3.9 x10E3/uL (ref 1.4–7.0)
Neutrophils: 63 %
Platelets: 311 x10E3/uL (ref 150–450)
RBC: 3.86 x10E6/uL (ref 3.77–5.28)
RDW: 12.8 % (ref 11.7–15.4)
WBC: 6.2 x10E3/uL (ref 3.4–10.8)

## 2024-01-28 ENCOUNTER — Encounter: Payer: Self-pay | Admitting: Family Medicine

## 2024-01-28 DIAGNOSIS — R079 Chest pain, unspecified: Secondary | ICD-10-CM | POA: Insufficient documentation

## 2024-01-28 NOTE — Assessment & Plan Note (Addendum)
 Having intermittent episodes of chest pain and dyspnea.  Seen in the ED recently for same symptoms and ruled out PE as well as ACS.  Her EKG today does not show any new changes from tracing in February 2019. Orders Placed This Encounter  Procedures   CMP14+EGFR   CBC with Differential/Platelet   Troponin T   EKG 12-Lead

## 2024-01-28 NOTE — Progress Notes (Signed)
 Christina Huerta - 63 y.o. female MRN [id number]  Date of birth: Mar 31, 1961  Subjective Chief Complaint  Patient presents with   Chest Pain    HPI Christina Huerta is a 63 y.o. female with complaint of chest pain.  She is actually here visiting our clinic with her husband when she began experiencing chest pain and dyspnea.  She did recently have left knee arthroscopy a few days ago.  She has had dyspnea for a couple of days.  She was seen in the ED on 01/23/2024.  She had CT angio which was negative for PE.  Negative cardiac enzymes.  She describes pain as sharp in nature.  No radiation of pain.  She does have dyspnea associated with this.  No palpitations or dizziness.  ROS:  A comprehensive ROS was completed and negative except as noted per HPI  Allergies  Allergen Reactions   Iodinated Contrast Media Dermatitis and Itching   Trimetrexate Anaphylaxis   Latex Other (See Comments) and Rash    Break out water welts   Sumatriptan Other (See Comments) and Rash    Face swelling,break out,red.  Only IM   Amlodipine  Swelling    LE edema   Astemizole Nausea Only   Oxycodone  Rash    Past Medical History:  Diagnosis Date   Allergy    Arthritis    Asthma    Depression    Hypertriglyceridemia 03/03/2017   IBS (irritable bowel syndrome)    Kidney stone    Meniscal injury, right, sequela 03/28/2017   Migraines     Past Surgical History:  Procedure Laterality Date   CESAREAN SECTION  1996   scar neunoma surgery  1998    Social History   Socioeconomic History   Marital status: Married    Spouse name: Not on file   Number of children: Not on file   Years of education: Not on file   Highest education level: Not on file  Occupational History   Not on file  Tobacco Use   Smoking status: Never   Smokeless tobacco: Never  Vaping Use   Vaping status: Never Used  Substance and Sexual Activity   Alcohol use: Yes    Comment: social   Drug use: No   Sexual activity: Yes  Other  Topics Concern   Not on file  Social History Narrative   Not on file   Social Drivers of Health   Financial Resource Strain: Patient Declined (07/03/2023)   Received from [facility]   Overall Financial Resource Strain (CARDIA)    Difficulty of Paying Living Expenses: Patient declined  Food Insecurity: No Food Insecurity (01/18/2024)   Received from [facility]   Hunger Vital Sign    Within the past 12 months, you worried that your food would run out before you got the money to buy more.: Never true    Within the past 12 months, the food you bought just didn't last and you didn't have money to get more.: Never true  Transportation Needs: No Transportation Needs (01/18/2024)   Received from [facility] - Transportation    In the past 12 months, has lack of transportation kept you from medical appointments or from getting medications?: No    In the past 12 months, has lack of transportation kept you from meetings, work, or from getting things needed for daily living?: No  Physical Activity: Not on file  Stress: No Stress Concern Present (01/18/2024)   Received from [facility]  [employer] [facility] - Occupational Stress Questionnaire    Do you feel stress - tense, restless, nervous, or anxious, or unable to sleep at night because your mind is troubled all the time - these days?: Not at all  Social Connections: Unknown (09/11/2021)   Received from [facility]   Social Network    Social Network: Not on file    Family History  Problem Relation Age of Onset   Hyperlipidemia Mother    Hyperlipidemia Father    Heart disease Father    Hypertension Father    Kidney disease Father    Arthritis Maternal Grandmother    Hyperlipidemia Maternal Grandmother    Cancer Maternal Grandfather        prostate cancer   Hyperlipidemia Maternal Grandfather    Arthritis Paternal Grandmother    Hyperlipidemia Paternal Grandmother    Cancer Paternal Grandfather         colon cancer   Hyperlipidemia Paternal Grandfather    Heart disease Paternal Grandfather    Stroke Paternal Grandfather    Hypertension Paternal Grandfather     Health Maintenance  Topic Date Due   Colonoscopy  06/29/2023   COVID-19 Vaccine (1 - 2024-25 season) Never done   Mammogram  02/13/2024   Cervical Cancer Screening (HPV/Pap Cotest)  02/16/2024 (Originally 12/23/1992)   Zoster Vaccines- Shingrix (1 of 2) 02/16/2024 (Originally 12/23/2012)   DTaP/Tdap/Td (1 - Tdap) 03/21/2024 (Originally 12/23/1981)   HIV Screening  03/21/2024 (Originally 12/23/1977)   Influenza Vaccine  07/30/2024 (Originally 12/01/2023)   Pneumococcal Vaccine: 50+ Years (1 of 2 - PCV) 01/09/2025 (Originally 12/23/1981)   Hepatitis C Screening  Completed   Hepatitis B Vaccines 19-59 Average Risk  Aged Out   HPV VACCINES  Aged Out   Meningococcal B Vaccine  Aged Out     ----------------------------------------------------------------------------------------------------------------------------------------------------------------------------------------------------------------- Physical Exam BP (!) 148/80 (BP Location: Left Arm, Patient Position: Sitting, Cuff Size: Large)   Pulse 90   Ht 5' 9 (1.753 m)   Wt 241 lb 9.6 oz (109.6 kg)   SpO2 96%   BMI 35.68 kg/m   Physical Exam Constitutional:      Appearance: She is well-developed.  Eyes:     General: No scleral icterus. Cardiovascular:     Rate and Rhythm: Normal rate and regular rhythm.  Pulmonary:     Effort: Pulmonary effort is normal.     Breath sounds: Normal breath sounds.  Neurological:     Mental Status: She is alert.  Psychiatric:        Mood and Affect: Mood normal.        Behavior: Behavior normal.    EKG: normal EKG, normal sinus rhythm, unchanged from previous  tracings.  ------------------------------------------------------------------------------------------------------------------------------------------------------------------------------------------------------------------- Assessment and Plan  Chest pain Having intermittent episodes of chest pain and dyspnea.  Seen in the ED recently for same symptoms and ruled out PE as well as ACS.  Her EKG today does not show any new changes from tracing in February 2019. Orders Placed This Encounter  Procedures   CMP14+EGFR   CBC with Differential/Platelet   Troponin T   EKG 12-Lead      No orders of the defined types were placed in this encounter.   No follow-ups on file.

## 2024-01-29 ENCOUNTER — Ambulatory Visit: Payer: Self-pay | Admitting: Family Medicine

## 2024-01-31 ENCOUNTER — Encounter: Payer: Self-pay | Admitting: Family Medicine

## 2024-02-01 DIAGNOSIS — M25569 Pain in unspecified knee: Secondary | ICD-10-CM | POA: Diagnosis not present

## 2024-02-01 DIAGNOSIS — Z9889 Other specified postprocedural states: Secondary | ICD-10-CM | POA: Diagnosis not present

## 2024-02-01 DIAGNOSIS — M23204 Derangement of unspecified medial meniscus due to old tear or injury, left knee: Secondary | ICD-10-CM | POA: Diagnosis not present

## 2024-02-01 DIAGNOSIS — S83282D Other tear of lateral meniscus, current injury, left knee, subsequent encounter: Secondary | ICD-10-CM | POA: Diagnosis not present

## 2024-02-05 DIAGNOSIS — M25562 Pain in left knee: Secondary | ICD-10-CM | POA: Diagnosis not present

## 2024-02-08 DIAGNOSIS — Z9889 Other specified postprocedural states: Secondary | ICD-10-CM | POA: Diagnosis not present

## 2024-02-12 DIAGNOSIS — M25562 Pain in left knee: Secondary | ICD-10-CM | POA: Diagnosis not present

## 2024-02-15 DIAGNOSIS — M25569 Pain in unspecified knee: Secondary | ICD-10-CM | POA: Diagnosis not present

## 2024-02-15 DIAGNOSIS — S83282D Other tear of lateral meniscus, current injury, left knee, subsequent encounter: Secondary | ICD-10-CM | POA: Diagnosis not present

## 2024-02-19 DIAGNOSIS — Z9889 Other specified postprocedural states: Secondary | ICD-10-CM | POA: Diagnosis not present

## 2024-02-20 ENCOUNTER — Telehealth: Payer: Self-pay | Admitting: Nurse Practitioner

## 2024-02-20 DIAGNOSIS — Z780 Asymptomatic menopausal state: Secondary | ICD-10-CM

## 2024-02-20 NOTE — Telephone Encounter (Signed)
 Please reorder Dexa scan/Bone Density Exam at [facility]. Please route to Admin team to schedule after placing new order.

## 2024-02-22 DIAGNOSIS — R002 Palpitations: Secondary | ICD-10-CM | POA: Diagnosis not present

## 2024-02-22 DIAGNOSIS — Z9889 Other specified postprocedural states: Secondary | ICD-10-CM | POA: Diagnosis not present

## 2024-02-26 ENCOUNTER — Ambulatory Visit: Payer: Self-pay

## 2024-02-26 ENCOUNTER — Encounter: Payer: Self-pay | Admitting: Nurse Practitioner

## 2024-02-26 DIAGNOSIS — M25562 Pain in left knee: Secondary | ICD-10-CM | POA: Diagnosis not present

## 2024-02-26 NOTE — Progress Notes (Signed)
 Daily Treatment  Patient Name:  Christina Huerta Date of Birth:  August 07, 1960 Today's Date:  February 26, 2024 Referring Provider: Lane Elspeth HERO, MD Visit #: 8 of     Precautions:  Precautions: avoid patellofemoral pain L knee s/p meniscectomy 01/18/24   PT Diagnosis: L knee pain, difficulty walking PT Prognosis: good  Assessment & Plan   Assessment  Assessment details: Pt tolerated today's session well with appropriate fatigue noted after session.  Pt advised to perform standing exercises slowly today to minimize increase in heart rate.  Pt was able to complete all exercises without issue.  She responded well to IASTM today, reporting decreased pain after treatment.  Pt shows gradual improvement with her L knee ROM and strength.   The patient requires continued skilled Physical Therapy services through the use of the therapeutic interventions listed below in order to address the identified impairments. Upon system's review, no comorbid conditions were identified to contraindicate therapeutic interventions.      NEXT SESSION:   Continue with manual therapy and exercise to increase L knee ROM, strength as able.  Continue IASTM as helpful.   Plan  Plan details: Therapy options:  Frequency: 2x/week Duration of Time: 12 weeks  Planned Interventions:  Planned Interventions: Aquatic Therapy, Gait Training, Home Exercise Program, Manual Therapy   , Neuromuscular Re-education, Self Care/Home Management/Education, Taping, Therapeutic Activities, and Therapeutic Exercises Planned Modalities: Cryotherapy, Electrical Stimulation (Attended or Unattended), and Thermotherapy    Patient/caregiver reported no cultural and/or religious practices that should be considered in patient's treatment plan. No psychosocial barriers to treatment have been identified at this time. No stresses, losses or important life events within the last twelve months were identified at this time. Patient/caregiver  were informed and in agreement with treatment plan risks and benefits.   Continual Care Information Had a surgical procedures in last 3 months related to conditions for which services are being requested?Yes Has the onset of injury occurred in the last  6 months? Yes Is this requested treatment for a complex neurological, medical or multi-trauma condition (ex: CVA, SCI, multiple fractures or severe deconditioning)? no Conditions expected to impact treatment: Respiratory Disorders  Social determinants of health Primary mitigating factors which impeded progress: None of these apply.       Subjective:  Subjective Evaluation    History of Present Illness   Subjective/Mechanism of injury: Pt reports 4/10 pain in her L knee today.  She states that she has had a rough day with her breathing.  She states that she has had two cardiac episodes.   Objective:  Lower Extremity ROM & Strength:02/01/2024 Anything left blank was deferred at this time  Motion (Normal) AROM PROM Strength - sitting Comments   Right Left Right Left Right Left           Hip:                Flexion (0-120o)          4+  2+    Extension (0-20o)                Abduction (0-45o)          4+ 2+     Adduction (0-30o)          4+ 2+     ER (0-50o)          4  defer    IR (0-45o)          4  defer  Knee:                Flexion (0-130o) 131  76       4+  2+    Extension (0o)  0  -23    -18  5  quad lag           Ankle:                Dorsiflexion (0-20o)          5  4+    Plantarflexion (0-45o)                Inversion (0-20o)          4+  4+    Eversion (0-15o)          4+  4+           Great Toe:                Extension (0-20o)                 Gait/Mobility:02/01/2024: antalgic, forward flexed, shuffling, and RW  Functional Tests:02/01/2024:  5 Times Sit to Stand Test Time: 6.08 seconds, heavy use of hands, 1 repetition only due to knee pain onset (improved by ice)  Lower Extremity Functional  Scale:  8/80 - see media tab  Patient Specific Functional Score   Activity 1 Score: 1 (walk normally without anything) Activity 2 Score: 2 (pick something up from the floor without reacher) Activity 3 Score: 0 (go up/down stairs) PSFS Score: 1  Today's Treatment/Intervention   Interventions Performed    Anything left blank was deferred at this time During all sensitive area treatments, patient provided continual verbal consent before and during each intervention.  Remote Therapeutic Monitoring:   Therapeutic Ex (97110):  Seated L LAQ, 2lb, 2x10 with 3 sec Seated L heel slides with slider 2x10 with 5 sec hold Seated L LAQ with ball squeeze, 2lb, 10x3sec Standing gastroc stretch on step 3x30sec Standing hip abduction, extension, 10x each on BLE's Seated L quad set + SLR 10x3sec    Therapeutic Act (02469):    Manual (02859):  IASTM to L quads, hamstrings, proximal calf for pain relief, pt in sitting, 8 min  Neuro Re-Ed (02887):    Gait Training [phone]):    Aquatic Therapy [phone]):  Modalities:  Deferred today:  Electrical Stimulation (TENs Knee program) while performing exercises, to tolerance, for pain control during exercises   Education:  Self-Care/Home Management [phone]):     HEP Provided:  Access Code: [email or web address] URL: [email or web address] Date: 02/01/2024 Prepared by: Jon Bunker Exercises - Supine Knee Extension Stretch on Towel Roll  - 3 x daily - 1-3 reps - 1-3 minutes hold - Supine Ankle Pumps  - 5 x daily - 20 reps - Supine Quad Set  - 2 x daily - 2 sets - 10 reps - 5 seconds hold - Seated Knee Flexion AAROM  - 1-2 x daily - 3 reps - 30 seconds hold Printed per patient request  Skilled PT required for exercise performance to ensure proper technique for continued safe & effective progression.  Each CPT code is separate and distinct therefore medically necessary.  Goals & Plan of Care   Start Date: February 01, 2024 End date:  04/25/24 Patient Goal: I want my leg to get back to normal  Pt will be independent with HEP to improve their ability to self-manage symptoms and reduce risk of recurrence. Pt will increase left  hip, left knee, and left ankle strength to 5/5 to improve their ability to perform functional mobility/ADLs. Pt will increase left knee ROM to 0-115 to enable performance of functional tasks and ADLs with less difficulty. Pt will report 2/10 or less pain with functional activity and less occurrence of sleep disturbance d/t pain. Pt will improve PSFS average score from 1 at initial evaluation by >2 in order to demonstrate functional improvement. Pt will improve Lower Extremity Functional Scale score from 8/80 at initial evaluation to at least 30/80 in order to demonstrate functional improvement.    Treatment Time  Today's Evaluation/Treatment: 1317 - 1402 Total Time: 45 min Certification Period: 02/01/24 to 04/25/24  Date for PT Re-Evaluation: 04/01/24   Charges  Total Time Code Treatment Minutes: 45  PT Therapeutic Time Entry Therapeutic Exercise Time Entry: 35 (2 units) Manual Therapy Time Entry: 8 (1 unit)   Donnice JONETTA Reason, PTA 02/26/2024 2:16 PM  [facility] [address] [place] [address] Dept: [phone] Dept Fax: [phone]

## 2024-02-26 NOTE — Telephone Encounter (Signed)
 FYI Only or Action Required?: Action required by provider: clinical question for provider.  Patient was last seen in primary care on 01/26/2024 by Alvia Bring, DO.  Called Nurse Triage reporting Chest Pain.  Symptoms began about a month ago.  Interventions attempted: Nothing.  Symptoms are: unchanged.  Triage Disposition: See Physician Within 24 Hours  Patient/caregiver understands and will follow disposition?: No, wishes to speak with PCP   Please call pt with results.   Copied from CRM [id number]. Topic: Clinical - Red Word Triage >> Feb 26, 2024  2:22 PM Ashley R wrote: [facility] that prompted transfer to Nurse Triage: Chest tightness, dizzyness weak feeling, low BPM   ----------------------------------------------------------------------- From previous Reason for Contact - Lab/Test Results: Reason for CRM: Awaiting hear monitor results. Would like a  Callback [phone] BPM going form 60-138.  Oxygen leveling off. Reason for Disposition  [1] Chest pain lasts > 5 minutes AND [2] occurred > 3 days ago (72 hours) AND [3] NO chest pain or cardiac symptoms now  Answer Assessment - Initial Assessment Questions Call transferred for results but also because patient said she was having symptoms. She states prior to surgery she never had any issues. They thought initially it was due to the anesthesia. Then when it continued said it could be that the anesthesia just brought to light an underlying condition. Pt is having no new or current symptoms. They are all the same from when the monitor is placed. She just wants to know the results to know what the next steps are and so she can call her cardiologist with the results. She states she is currently having no symptoms. States this morning at 6am she was dizzy and her heart rate was 60. Yesterday standing in her garage doing nothing it went up to 138. When its lower she feels dizzy when its higher she feels like some is trying to lift the  left side of her chest.   1. LOCATION: Where does it hurt?       No pain currently 2. RADIATION: Does the pain go anywhere else? (e.g., into neck, jaw, arms, back)     None currently 3. ONSET: When did the chest pain begin? (Minutes, hours or days)      After surgery 4. PATTERN: Does the pain come and go, or has it been constant since it started?  Does it get worse with exertion?      Intermittent, no trigger 5. DURATION: How long does it last (e.g., seconds, minutes, hours)     unknwon 6. SEVERITY: How bad is the pain?  (e.g., Scale 1-10; mild, moderate, or severe)     None currently 7. CARDIAC RISK FACTORS: Do you have any history of heart problems or risk factors for heart disease? (e.g., angina, prior heart attack; diabetes, high blood pressure, high cholesterol, smoker, or strong family history of heart disease)     HLD, atherosclerosis  10. OTHER SYMPTOMS: Do you have any other symptoms? (e.g., dizziness, nausea, vomiting, sweating, fever, difficulty breathing, cough)       Dizziness, weak, high and low HR, tightness  Protocols used: Chest Pain-A-AH

## 2024-02-27 ENCOUNTER — Telehealth: Payer: Self-pay

## 2024-02-27 DIAGNOSIS — R079 Chest pain, unspecified: Secondary | ICD-10-CM | POA: Diagnosis not present

## 2024-02-27 DIAGNOSIS — Z789 Other specified health status: Secondary | ICD-10-CM | POA: Diagnosis not present

## 2024-02-27 DIAGNOSIS — I10 Essential (primary) hypertension: Secondary | ICD-10-CM | POA: Diagnosis not present

## 2024-02-27 DIAGNOSIS — R002 Palpitations: Secondary | ICD-10-CM | POA: Diagnosis not present

## 2024-02-27 DIAGNOSIS — E782 Mixed hyperlipidemia: Secondary | ICD-10-CM | POA: Diagnosis not present

## 2024-02-27 NOTE — Telephone Encounter (Addendum)
 Called patient and she informed me that she saw her cardiologist at [facility] today for the chest pain.

## 2024-02-27 NOTE — Telephone Encounter (Signed)
 Called patient and informed her that we are still awaiting Cardiology to review and that Roselie unable to provide a specific date at this time. Re-informed patient that once reviewed by Cardiology we will be in contact with her. She verbalized understanding and all (if any) questions were answered.

## 2024-02-27 NOTE — Telephone Encounter (Signed)
 Please see message below.   Thanks,  Nataliah Hatlestad CCMA

## 2024-02-27 NOTE — Telephone Encounter (Signed)
 Returned call to [place] at [facility] and left a message asking for her to give me a call back at [phone].

## 2024-02-27 NOTE — Telephone Encounter (Signed)
 Copied from CRM #[id number]. Topic: General - Other >> Feb 27, 2024 12:15 PM Eva FALCON wrote: Reason for CRM: Rosina from [facility] is calling in regarding the Heart monitor results, I looked in chart and see that we don't have those in? From what I understood the cardiologist should have that information but they state they don't. Pt is expected to see [facility] this afternoon at [facility]. Could someone give Rosina a call back [phone] to further assist if possible before 2PM.

## 2024-02-27 NOTE — Telephone Encounter (Signed)
 Copied from CRM [id number]. Topic: Clinical - Lab/Test Results >> Feb 26, 2024  2:24 PM Ashley R wrote: Reason for CRM: Awaiting hear monitor results. Would like a  Callback [id number] BPM going form 60-138.  Oxygen leveling off. Triaged based on reports of chest tightness, dizziness, weakness

## 2024-02-28 NOTE — Telephone Encounter (Signed)
 appointment not needed she saw Cardiology for the chest pain

## 2024-02-28 NOTE — Telephone Encounter (Signed)
 Noted

## 2024-02-29 DIAGNOSIS — Z9889 Other specified postprocedural states: Secondary | ICD-10-CM | POA: Diagnosis not present

## 2024-02-29 DIAGNOSIS — S83282D Other tear of lateral meniscus, current injury, left knee, subsequent encounter: Secondary | ICD-10-CM | POA: Diagnosis not present

## 2024-03-02 DIAGNOSIS — R002 Palpitations: Secondary | ICD-10-CM

## 2024-03-04 ENCOUNTER — Ambulatory Visit: Payer: Self-pay | Admitting: Nurse Practitioner

## 2024-03-04 DIAGNOSIS — I2510 Atherosclerotic heart disease of native coronary artery without angina pectoris: Secondary | ICD-10-CM | POA: Diagnosis not present

## 2024-03-07 DIAGNOSIS — Z9889 Other specified postprocedural states: Secondary | ICD-10-CM | POA: Diagnosis not present

## 2024-03-11 DIAGNOSIS — M25562 Pain in left knee: Secondary | ICD-10-CM | POA: Diagnosis not present

## 2024-03-11 DIAGNOSIS — M25569 Pain in unspecified knee: Secondary | ICD-10-CM | POA: Diagnosis not present

## 2024-03-13 DIAGNOSIS — M25562 Pain in left knee: Secondary | ICD-10-CM | POA: Diagnosis not present

## 2024-03-13 DIAGNOSIS — M25569 Pain in unspecified knee: Secondary | ICD-10-CM | POA: Diagnosis not present

## 2024-03-14 ENCOUNTER — Encounter: Payer: Self-pay | Admitting: Family Medicine

## 2024-03-14 ENCOUNTER — Ambulatory Visit (INDEPENDENT_AMBULATORY_CARE_PROVIDER_SITE_OTHER): Admitting: Family Medicine

## 2024-03-14 VITALS — BP 118/77 | HR 76 | Ht 69.0 in | Wt 245.0 lb

## 2024-03-14 DIAGNOSIS — R079 Chest pain, unspecified: Secondary | ICD-10-CM | POA: Diagnosis not present

## 2024-03-14 NOTE — Assessment & Plan Note (Addendum)
 Having intermittent episodes of chest pain and dyspnea.  Seen in the ED previously for same symptoms and ruled out PE as well as ACS.  She also had similar symptoms the last time she was at our clinic and had negative troponins and no new EKG changes.  Her EKG today does not show any new changes from previous tracings.  She does have upcoming cardiac cath.  She does report feeling better after eating something in our clinic today.   Red flags reviewed and recommend seeking emergency care if symptoms return.

## 2024-03-14 NOTE — Progress Notes (Signed)
 Christina Huerta - 63 y.o. female MRN [id number]  Date of birth: [birth year]  Subjective Chief Complaint  Patient presents with   Chest Pain    HPI Christina Huerta is a 63 year old female here today accompanying her husband to the appointment.  During the appointment she began to complain of chest pain.  Pain is substernal along the left side.  She denies dyspnea or palpitations.  She did have some coronary calcifications on recent CT angio of the chest.  ROS:  A comprehensive ROS was completed and negative except as noted per HPI  Allergies  Allergen Reactions   Iodinated Contrast Media Dermatitis and Itching   Trimetrexate Anaphylaxis   Latex Other (See Comments) and Rash    Break out water welts   Sumatriptan Other (See Comments) and Rash    Face swelling,break out,red.  Only IM   Amlodipine  Swelling    LE edema   Astemizole Nausea Only   Oxycodone  Rash    Past Medical History:  Diagnosis Date   Allergy    Arthritis    Asthma    Depression    Hypertriglyceridemia 03/03/2017   IBS (irritable bowel syndrome)    Kidney stone    Meniscal injury, right, sequela 03/28/2017   Migraines     Past Surgical History:  Procedure Laterality Date   CESAREAN SECTION  1996   scar neunoma surgery  1998    Social History   Socioeconomic History   Marital status: Married    Spouse name: Not on file   Number of children: Not on file   Years of education: Not on file   Highest education level: Not on file  Occupational History   Not on file  Tobacco Use   Smoking status: Never   Smokeless tobacco: Never  Vaping Use   Vaping status: Never Used  Substance and Sexual Activity   Alcohol use: Yes    Comment: social   Drug use: No   Sexual activity: Yes  Other Topics Concern   Not on file  Social History Narrative   Not on file   Social Drivers of Health   Financial Resource Strain: Patient Declined (07/03/2023)   Received from [facility]   Overall Financial Resource  Strain (CARDIA)    Difficulty of Paying Living Expenses: Patient declined  Food Insecurity: No Food Insecurity (01/18/2024)   Received from [facility]   Hunger Vital Sign    Within the past 12 months, you worried that your food would run out before you got the money to buy more.: Never true    Within the past 12 months, the food you bought just didn't last and you didn't have money to get more.: Never true  Transportation Needs: No Transportation Needs (01/18/2024)   Received from [facility] - Transportation    In the past 12 months, has lack of transportation kept you from medical appointments or from getting medications?: No    In the past 12 months, has lack of transportation kept you from meetings, work, or from getting things needed for daily living?: No  Physical Activity: Not on file  Stress: No Stress Concern Present (01/18/2024)   Received from [facility] - Occupational Stress Questionnaire    Do you feel stress - tense, restless, nervous, or anxious, or unable to sleep at night because your mind is troubled all the time - these days?: Not at all  Social Connections: Not on file  Family History  Problem Relation Age of Onset   Hyperlipidemia Mother    Hyperlipidemia Father    Heart disease Father    Hypertension Father    Kidney disease Father    Arthritis Maternal Grandmother    Hyperlipidemia Maternal Grandmother    Cancer Maternal Grandfather        prostate cancer   Hyperlipidemia Maternal Grandfather    Arthritis Paternal Grandmother    Hyperlipidemia Paternal Grandmother    Cancer Paternal Grandfather        colon cancer   Hyperlipidemia Paternal Grandfather    Heart disease Paternal Grandfather    Stroke Paternal Grandfather    Hypertension Paternal Grandfather     Health Maintenance  Topic Date Due   Cervical Cancer Screening (HPV/Pap Cotest)  Never done   Colonoscopy  06/29/2023   Mammogram   02/13/2024   DTaP/Tdap/Td (2 - Tdap) 03/21/2024 (Originally 05/02/2017)   HIV Screening  03/21/2024 (Originally 12/23/1977)   Zoster Vaccines- Shingrix (1 of 2) 06/14/2024 (Originally 12/23/2012)   Influenza Vaccine  07/30/2024 (Originally 12/01/2023)   Pneumococcal Vaccine: 50+ Years (2 of 2 - PCV) 01/09/2025 (Originally 08/18/2011)   COVID-19 Vaccine (1 - 2025-26 season) 03/30/2025 (Originally 01/01/2024)   Hepatitis C Screening  Completed   Hepatitis B Vaccines 19-59 Average Risk  Aged Out   HPV VACCINES  Aged Out   Meningococcal B Vaccine  Aged Out     ----------------------------------------------------------------------------------------------------------------------------------------------------------------------------------------------------------------- Physical Exam BP 118/77 (BP Location: Right Arm, Patient Position: Sitting, Cuff Size: Large)   Pulse 76   Ht 5' 9 (1.753 m)   Wt 245 lb (111.1 kg)   SpO2 95%   BMI 36.18 kg/m   Physical Exam Constitutional:      Appearance: Normal appearance.  Eyes:     General: No scleral icterus. Cardiovascular:     Rate and Rhythm: Normal rate and regular rhythm.  Pulmonary:     Effort: Pulmonary effort is normal.     Breath sounds: Normal breath sounds.  Musculoskeletal:     Cervical back: Neck supple.  Neurological:     Mental Status: She is alert.  Psychiatric:        Mood and Affect: Mood normal.        Behavior: Behavior normal.   EKG: normal EKG, normal sinus rhythm, unchanged from previous tracings.   ------------------------------------------------------------------------------------------------------------------------------------------------------------------------------------------------------------------- Assessment and Plan  Chest pain Having intermittent episodes of chest pain and dyspnea.  Seen in the ED previously for same symptoms and ruled out PE as well as ACS.  She also had similar symptoms the last time she was  at our clinic and had negative troponins and no new EKG changes.  Her EKG today does not show any new changes from previous tracings.  She does have upcoming cardiac cath.  She does report feeling better after eating something in our clinic today.   Red flags reviewed and recommend seeking emergency care if symptoms return.    No orders of the defined types were placed in this encounter.   No follow-ups on file.

## 2024-03-19 DIAGNOSIS — Z955 Presence of coronary angioplasty implant and graft: Secondary | ICD-10-CM | POA: Insufficient documentation

## 2024-03-19 DIAGNOSIS — I25118 Atherosclerotic heart disease of native coronary artery with other forms of angina pectoris: Secondary | ICD-10-CM | POA: Diagnosis not present

## 2024-03-19 DIAGNOSIS — E782 Mixed hyperlipidemia: Secondary | ICD-10-CM | POA: Diagnosis not present

## 2024-03-19 DIAGNOSIS — R002 Palpitations: Secondary | ICD-10-CM | POA: Diagnosis not present

## 2024-03-19 DIAGNOSIS — Z79899 Other long term (current) drug therapy: Secondary | ICD-10-CM | POA: Diagnosis not present

## 2024-03-19 DIAGNOSIS — R079 Chest pain, unspecified: Secondary | ICD-10-CM | POA: Diagnosis not present

## 2024-03-19 DIAGNOSIS — I10 Essential (primary) hypertension: Secondary | ICD-10-CM | POA: Diagnosis not present

## 2024-03-19 DIAGNOSIS — I2510 Atherosclerotic heart disease of native coronary artery without angina pectoris: Secondary | ICD-10-CM | POA: Diagnosis not present

## 2024-03-22 DIAGNOSIS — I2510 Atherosclerotic heart disease of native coronary artery without angina pectoris: Secondary | ICD-10-CM | POA: Diagnosis not present

## 2024-03-22 DIAGNOSIS — R002 Palpitations: Secondary | ICD-10-CM | POA: Diagnosis not present

## 2024-03-22 NOTE — Addendum Note (Signed)
 Addended by: BONNY JON DEL on: 03/22/2024 10:03 AM   Modules accepted: Orders

## 2024-03-25 ENCOUNTER — Ambulatory Visit: Admitting: Family Medicine

## 2024-04-01 ENCOUNTER — Encounter: Payer: Self-pay | Admitting: Family Medicine

## 2024-04-01 ENCOUNTER — Ambulatory Visit: Admitting: Family Medicine

## 2024-04-01 VITALS — BP 117/78 | HR 80 | Ht 69.0 in | Wt 245.0 lb

## 2024-04-01 DIAGNOSIS — I2510 Atherosclerotic heart disease of native coronary artery without angina pectoris: Secondary | ICD-10-CM

## 2024-04-01 DIAGNOSIS — M25562 Pain in left knee: Secondary | ICD-10-CM | POA: Diagnosis not present

## 2024-04-01 NOTE — Assessment & Plan Note (Signed)
 Status post DES x 2.  She remains on dual antiplatelet therapy x 1 year.  Continue Repatha for lipid management.  28-day Zio patch currently in place.  She is considering starting cardiac rehab as well.

## 2024-04-01 NOTE — Progress Notes (Signed)
 Christina Huerta - 63 y.o. female MRN [id number]  Date of birth: [birth year]  Subjective Chief Complaint  Patient presents with   Hospitalization Follow-up    HPI Christina Huerta is a 63 year old female here today for initial visit to establish care.  She recently had a cardiac catheterization due to anginal symptoms.  Cath showed proximal LAD to mid LAD lesion with 80% stenosis as well as ostial LAD to proximal LAD lesion which is 75% stenosed.  These lesions were both stented.  She will remain on dual antiplatelet therapy with Brilinta and aspirin x 1 year.  She is on Repatha for lipid management.  She is wearing a heart monitor as there has been concerns about possible A-fib.  She has had some mild episodic chest pain, however this pain is different than the pain she was having.  ROS:  A comprehensive ROS was completed and negative except as noted per HPI   Past Medical History:  Diagnosis Date   Allergy    Arthritis    Asthma    Depression    Hypertriglyceridemia 03/03/2017   IBS (irritable bowel syndrome)    Kidney stone    Meniscal injury, right, sequela 03/28/2017   Migraines     Past Surgical History:  Procedure Laterality Date   CESAREAN SECTION  1996   scar neunoma surgery  1998    Social History   Socioeconomic History   Marital status: Married    Spouse name: Not on file   Number of children: Not on file   Years of education: Not on file   Highest education level: Not on file  Occupational History   Not on file  Tobacco Use   Smoking status: Never   Smokeless tobacco: Never  Vaping Use   Vaping status: Never Used  Substance and Sexual Activity   Alcohol use: Yes    Comment: social   Drug use: No   Sexual activity: Yes  Other Topics Concern   Not on file  Social History Narrative   Not on file   Social Drivers of Health   Financial Resource Strain: Patient Declined (07/03/2023)   Received from [facility]   Overall Financial Resource Strain (CARDIA)     Difficulty of Paying Living Expenses: Patient declined  Food Insecurity: No Food Insecurity (01/18/2024)   Received from [facility]   Hunger Vital Sign    Within the past 12 months, you worried that your food would run out before you got the money to buy more.: Never true    Within the past 12 months, the food you bought just didn't last and you didn't have money to get more.: Never true  Transportation Needs: No Transportation Needs (01/18/2024)   Received from [facility] - Transportation    In the past 12 months, has lack of transportation kept you from medical appointments or from getting medications?: No    In the past 12 months, has lack of transportation kept you from meetings, work, or from getting things needed for daily living?: No  Physical Activity: Inactive (04/01/2024)   Exercise Vital Sign    Days of Exercise per Week: 0 days    Minutes of Exercise per Session: 0 min  Stress: Stress Concern Present (03/19/2024)   Received from [facility] - Occupational Stress Questionnaire    Do you feel stress - tense, restless, nervous, or anxious, or unable to sleep at night because your mind is  troubled all the time - these days?: Rather much  Social Connections: Socially Integrated (04/01/2024)   Social Connection and Isolation Panel    Frequency of Communication with Friends and Family: More than three times a week    Frequency of Social Gatherings with Friends and Family: Once a week    Attends Religious Services: More than 4 times per year    Active Member of Clubs or Organizations: Yes    Attends Banker Meetings: 1 to 4 times per year    Marital Status: Married    Family History  Problem Relation Age of Onset   Hyperlipidemia Mother    Hyperlipidemia Father    Heart disease Father    Hypertension Father    Kidney disease Father    Arthritis Maternal Grandmother    Hyperlipidemia Maternal Grandmother     Cancer Maternal Grandfather        prostate cancer   Hyperlipidemia Maternal Grandfather    Arthritis Paternal Grandmother    Hyperlipidemia Paternal Grandmother    Cancer Paternal Grandfather        colon cancer   Hyperlipidemia Paternal Grandfather    Heart disease Paternal Grandfather    Stroke Paternal Grandfather    Hypertension Paternal Grandfather     Health Maintenance  Topic Date Due   HIV Screening  Never done   Cervical Cancer Screening (HPV/Pap Cotest)  Never done   DTaP/Tdap/Td (2 - Tdap) 05/02/2017   Colonoscopy  06/29/2023   Mammogram  02/13/2024   Zoster Vaccines- Shingrix (1 of 2) 06/14/2024 (Originally 12/23/1981)   Influenza Vaccine  07/30/2024 (Originally 12/01/2023)   Pneumococcal Vaccine: 50+ Years (2 of 2 - PCV) 01/09/2025 (Originally 08/18/2011)   COVID-19 Vaccine (1) 03/30/2025 (Originally 12/24/1967)   Hepatitis C Screening  Completed   Hepatitis B Vaccines 19-59 Average Risk  Aged Out   HPV VACCINES  Aged Out   Meningococcal B Vaccine  Aged Out     ----------------------------------------------------------------------------------------------------------------------------------------------------------------------------------------------------------------- Physical Exam BP 117/78 (BP Location: Left Arm, Patient Position: Sitting, Cuff Size: Large)   Pulse 80   Ht 5' 9 (1.753 m)   Wt 245 lb (111.1 kg)   SpO2 96%   BMI 36.18 kg/m   Physical Exam Constitutional:      Appearance: Normal appearance.  Eyes:     General: No scleral icterus. Cardiovascular:     Rate and Rhythm: Normal rate and regular rhythm.  Pulmonary:     Effort: Pulmonary effort is normal.     Breath sounds: Normal breath sounds.  Musculoskeletal:     Cervical back: Neck supple.  Neurological:     Mental Status: She is alert.  Psychiatric:        Mood and Affect: Mood normal.        Behavior: Behavior normal.    Mood  ------------------------------------------------------------------------------------------------------------------------------------------------------------------------------------------------------------------- Assessment and Plan  Coronary artery disease Status post DES x 2.  She remains on dual antiplatelet therapy x 1 year.  Continue Repatha for lipid management.  28-day Zio patch currently in place.  She is considering starting cardiac rehab as well.   No orders of the defined types were placed in this encounter.   Return in about 4 months (around 07/31/2024) for Hypertension.

## 2024-04-08 DIAGNOSIS — I2510 Atherosclerotic heart disease of native coronary artery without angina pectoris: Secondary | ICD-10-CM | POA: Diagnosis not present

## 2024-04-08 DIAGNOSIS — Z955 Presence of coronary angioplasty implant and graft: Secondary | ICD-10-CM | POA: Diagnosis not present

## 2024-04-10 DIAGNOSIS — Z955 Presence of coronary angioplasty implant and graft: Secondary | ICD-10-CM | POA: Diagnosis not present

## 2024-04-10 DIAGNOSIS — I2510 Atherosclerotic heart disease of native coronary artery without angina pectoris: Secondary | ICD-10-CM | POA: Diagnosis not present

## 2024-04-11 ENCOUNTER — Other Ambulatory Visit: Payer: Self-pay | Admitting: Family Medicine

## 2024-04-11 MED ORDER — OSELTAMIVIR PHOSPHATE 75 MG PO CAPS: 75.0000 mg | ORAL_CAPSULE | Freq: Every day | ORAL | 0 refills | Status: AC

## 2024-04-11 NOTE — Progress Notes (Signed)
 Husband tested + for influenza A in clinic today.

## 2024-04-16 DIAGNOSIS — Z789 Other specified health status: Secondary | ICD-10-CM | POA: Diagnosis not present

## 2024-04-16 DIAGNOSIS — E782 Mixed hyperlipidemia: Secondary | ICD-10-CM | POA: Diagnosis not present

## 2024-04-16 DIAGNOSIS — E781 Pure hyperglyceridemia: Secondary | ICD-10-CM | POA: Diagnosis not present

## 2024-04-18 DIAGNOSIS — Z955 Presence of coronary angioplasty implant and graft: Secondary | ICD-10-CM | POA: Diagnosis not present

## 2024-04-18 DIAGNOSIS — E782 Mixed hyperlipidemia: Secondary | ICD-10-CM | POA: Diagnosis not present

## 2024-04-18 DIAGNOSIS — I2510 Atherosclerotic heart disease of native coronary artery without angina pectoris: Secondary | ICD-10-CM | POA: Diagnosis not present

## 2024-04-18 DIAGNOSIS — I10 Essential (primary) hypertension: Secondary | ICD-10-CM | POA: Diagnosis not present

## 2024-04-18 DIAGNOSIS — R002 Palpitations: Secondary | ICD-10-CM | POA: Diagnosis not present

## 2024-04-22 DIAGNOSIS — I10 Essential (primary) hypertension: Secondary | ICD-10-CM | POA: Diagnosis not present

## 2024-04-22 DIAGNOSIS — I2510 Atherosclerotic heart disease of native coronary artery without angina pectoris: Secondary | ICD-10-CM | POA: Diagnosis not present

## 2024-04-22 DIAGNOSIS — Z955 Presence of coronary angioplasty implant and graft: Secondary | ICD-10-CM | POA: Diagnosis not present

## 2024-04-22 DIAGNOSIS — S83282D Other tear of lateral meniscus, current injury, left knee, subsequent encounter: Secondary | ICD-10-CM | POA: Diagnosis not present

## 2024-04-22 DIAGNOSIS — M7652 Patellar tendinitis, left knee: Secondary | ICD-10-CM | POA: Diagnosis not present

## 2024-04-29 DIAGNOSIS — I2510 Atherosclerotic heart disease of native coronary artery without angina pectoris: Secondary | ICD-10-CM | POA: Diagnosis not present

## 2024-04-29 DIAGNOSIS — Z955 Presence of coronary angioplasty implant and graft: Secondary | ICD-10-CM | POA: Diagnosis not present

## 2024-05-01 DIAGNOSIS — Z955 Presence of coronary angioplasty implant and graft: Secondary | ICD-10-CM | POA: Diagnosis not present

## 2024-05-01 DIAGNOSIS — I2510 Atherosclerotic heart disease of native coronary artery without angina pectoris: Secondary | ICD-10-CM | POA: Diagnosis not present

## 2024-05-22 ENCOUNTER — Ambulatory Visit: Admitting: Family Medicine

## 2024-05-22 ENCOUNTER — Encounter: Payer: Self-pay | Admitting: Family Medicine

## 2024-05-22 VITALS — BP 86/54 | HR 77 | Ht 69.0 in | Wt 232.0 lb

## 2024-05-22 DIAGNOSIS — R7989 Other specified abnormal findings of blood chemistry: Secondary | ICD-10-CM | POA: Diagnosis not present

## 2024-05-22 DIAGNOSIS — K819 Cholecystitis, unspecified: Secondary | ICD-10-CM | POA: Diagnosis not present

## 2024-05-22 DIAGNOSIS — Z9049 Acquired absence of other specified parts of digestive tract: Secondary | ICD-10-CM

## 2024-05-22 MED ORDER — ONDANSETRON 4 MG PO TBDP: 4.0000 mg | ORAL_TABLET | Freq: Three times a day (TID) | ORAL | 0 refills | Status: AC | PRN

## 2024-05-22 NOTE — Assessment & Plan Note (Addendum)
 Status post cholecystectomy.  Surgical sites look clean without infection.  Continue routine care.  Rechecking CMP and CBC.  Zofran  added for nausea.  Keep postop visit with surgeon as well.

## 2024-05-22 NOTE — Progress Notes (Signed)
 " Christina Huerta - 64 y.o. female MRN [id number]  Date of birth: 10/31/1960  Subjective Chief Complaint  Patient presents with   Hospitalization Follow-up    HPI Christina Huerta is a 63 y.o. female here today for hospital follow up/Post-operative visit.    She was seen in the ED with complaint of abdominal pain.  US  with findings consistent with acute cholecystitis. .  Mild elevation in LFT's.  Now s/p robotic laparoscopic cholecystectomy.  She reports that she is doing well.  Having some nausea and decreased appetite.  She is off of pain medication.  Bowels are moving normally.  Occasional oozing from incision around umbilicus.  She has appt with surgeon next week.   ROS:  A comprehensive ROS was completed and negative except as noted per HPI  Allergies[1]  Past Medical History:  Diagnosis Date   Allergy    Arthritis    Asthma    Depression    Hypertriglyceridemia 03/03/2017   IBS (irritable bowel syndrome)    Kidney stone    Meniscal injury, right, sequela 03/28/2017   Migraines     Past Surgical History:  Procedure Laterality Date   CESAREAN SECTION  1996   scar neunoma surgery  1998    Social History   Socioeconomic History   Marital status: Married    Spouse name: Not on file   Number of children: Not on file   Years of education: Not on file   Highest education level: Not on file  Occupational History   Not on file  Tobacco Use   Smoking status: Never   Smokeless tobacco: Never  Vaping Use   Vaping status: Never Used  Substance and Sexual Activity   Alcohol use: Yes    Comment: social   Drug use: No   Sexual activity: Yes  Other Topics Concern   Not on file  Social History Narrative   Not on file   Social Drivers of Health   Tobacco Use: Low Risk (05/13/2024)   Received from [facility]   Patient History    Smoking Tobacco Use: Never    Smokeless Tobacco Use: Never    Passive Exposure: Not on file  Financial Resource Strain: Patient Declined  (07/03/2023)   Received from [facility]   Overall Financial Resource Strain (CARDIA)    Difficulty of Paying Living Expenses: Patient declined  Food Insecurity: No Food Insecurity (05/10/2024)   Received from [facility]   Epic    Within the past 12 months, you worried that your food would run out before you got the money to buy more.: Never true    Within the past 12 months, the food you bought just didn't last and you didn't have money to get more.: Never true  Transportation Needs: No Transportation Needs (05/10/2024)   Received from [facility]    In the past 12 months, has lack of transportation kept you from medical appointments or from getting medications?: No    In the past 12 months, has lack of transportation kept you from meetings, work, or from getting things needed for daily living?: No  Physical Activity: Inactive (04/01/2024)   Exercise Vital Sign    Days of Exercise per Week: 0 days    Minutes of Exercise per Session: 0 min  Stress: No Stress Concern Present (05/10/2024)   Received from [facility] - Occupational Stress Questionnaire    Do you feel stress - tense,  restless, nervous, or anxious, or unable to sleep at night because your mind is troubled all the time - these days?: Only a little  Recent Concern: Stress - Stress Concern Present (03/19/2024)   Received from [facility] - Occupational Stress Questionnaire    Do you feel stress - tense, restless, nervous, or anxious, or unable to sleep at night because your mind is troubled all the time - these days?: Rather much  Social Connections: Socially Integrated (04/01/2024)   Social Connection and Isolation Panel    Frequency of Communication with Friends and Family: More than three times a week    Frequency of Social Gatherings with Friends and Family: Once a week    Attends Religious Services: More than 4 times per year     Active Member of Clubs or Organizations: Yes    Attends Banker Meetings: 1 to 4 times per year    Marital Status: Married  Depression (PHQ2-9): Low Risk (01/26/2024)   Depression (PHQ2-9)    PHQ-2 Score: 2  Alcohol Screen: Low Risk (04/01/2024)   Alcohol Screen    Last Alcohol Screening Score (AUDIT): 1  Housing: Low Risk (05/10/2024)   Received from [facility]    In the last 12 months, was there a time when you were not able to pay the mortgage or rent on time?: No    In the past 12 months, how many times have you moved where you were living?: 0    At any time in the past 12 months, were you homeless or living in a shelter (including now)?: No  Utilities: Not At Risk (05/10/2024)   Received from [facility]    In the past 12 months has the electric, gas, oil, or water company threatened to shut off services in your home?: No  Health Literacy: Adequate Health Literacy (04/01/2024)   B1300 Health Literacy    Frequency of need for help with medical instructions: Rarely    Family History  Problem Relation Age of Onset   Hyperlipidemia Mother    Hyperlipidemia Father    Heart disease Father    Hypertension Father    Kidney disease Father    Arthritis Maternal Grandmother    Hyperlipidemia Maternal Grandmother    Cancer Maternal Grandfather        prostate cancer   Hyperlipidemia Maternal Grandfather    Arthritis Paternal Grandmother    Hyperlipidemia Paternal Grandmother    Cancer Paternal Grandfather        colon cancer   Hyperlipidemia Paternal Grandfather    Heart disease Paternal Grandfather    Stroke Paternal Grandfather    Hypertension Paternal Grandfather     Health Maintenance  Topic Date Due   HIV Screening  Never done   Cervical Cancer Screening (HPV/Pap Cotest)  Never done   DTaP/Tdap/Td (2 - Tdap) 05/02/2017   Colonoscopy  06/29/2023   Mammogram  02/13/2024   Zoster Vaccines- Shingrix (1 of 2) 06/14/2024 (Originally 12/23/1981)    Influenza Vaccine  07/30/2024 (Originally 12/01/2023)   Pneumococcal Vaccine: 50+ Years (2 of 2 - PCV) 01/09/2025 (Originally 08/18/2011)   COVID-19 Vaccine (1) 03/30/2025 (Originally 06[birth year]26/1963)   HPV VACCINES (No Doses Required) Completed   Hepatitis C Screening  Completed   Hepatitis B Vaccines 19-59 Average Risk  Aged Out   Meningococcal B Vaccine  Aged Out     ----------------------------------------------------------------------------------------------------------------------------------------------------------------------------------------------------------------- Physical Exam BP (!) 86/54  Pulse 77   Ht 5' 9 (1.753 m)   Wt 232 lb (105.2 kg)   SpO2 97%   BMI 34.26 kg/m   Physical Exam Constitutional:      Appearance: Normal appearance.  Cardiovascular:     Rate and Rhythm: Normal rate and regular rhythm.  Pulmonary:     Effort: Pulmonary effort is normal.     Breath sounds: Normal breath sounds.  Abdominal:     General: There is no distension.     Palpations: Abdomen is soft.     Tenderness: There is no abdominal tenderness.     Comments: Steri strip covering most wounds.  No bleeding or purulent drainage.   Musculoskeletal:     Cervical back: Neck supple.  Neurological:     Mental Status: She is alert.     ------------------------------------------------------------------------------------------------------------------------------------------------------------------------------------------------------------------- Assessment and Plan  Cholecystitis Status post cholecystectomy.  Surgical sites look clean without infection.  Continue routine care.  Rechecking CMP and CBC.  Zofran  added for nausea.  Keep postop visit with surgeon as well.   Meds ordered this encounter  Medications   ondansetron  (ZOFRAN -ODT) 4 MG disintegrating tablet    Sig: Take 1 tablet (4 mg total) by mouth every 8 (eight) hours as needed for nausea or vomiting.    Dispense:  20 tablet     Refill:  0    No follow-ups on file.         [1]  Allergies Allergen Reactions   Iodinated Contrast Media Dermatitis and Itching   Trimetrexate Anaphylaxis   Latex Other (See Comments) and Rash    Break out water welts   Sumatriptan Other (See Comments) and Rash    Face swelling,break out,red.  Only IM   Amlodipine  Swelling    LE edema   Astemizole Nausea Only   Oxycodone  Rash   "

## 2024-05-23 LAB — CMP14+EGFR
ALT: 23 IU/L (ref 0–32)
AST: 14 IU/L (ref 0–40)
Albumin: 4.5 g/dL (ref 3.9–4.9)
Alkaline Phosphatase: 70 IU/L (ref 49–135)
BUN/Creatinine Ratio: 29 — ABNORMAL HIGH (ref 12–28)
BUN: 26 mg/dL (ref 8–27)
Bilirubin Total: 0.4 mg/dL (ref 0.0–1.2)
CO2: 19 mmol/L — ABNORMAL LOW (ref 20–29)
Calcium: 9.5 mg/dL (ref 8.7–10.3)
Chloride: 100 mmol/L (ref 96–106)
Creatinine, Ser: 0.91 mg/dL (ref 0.57–1.00)
Globulin, Total: 2.7 g/dL (ref 1.5–4.5)
Glucose: 128 mg/dL — ABNORMAL HIGH (ref 70–99)
Potassium: 4.1 mmol/L (ref 3.5–5.2)
Sodium: 138 mmol/L (ref 134–144)
Total Protein: 7.2 g/dL (ref 6.0–8.5)
eGFR: 72 mL/min/1.73

## 2024-05-23 LAB — CBC WITH DIFFERENTIAL/PLATELET
Basophils Absolute: 0 x10E3/uL (ref 0.0–0.2)
Basos: 0 %
EOS (ABSOLUTE): 0.4 x10E3/uL (ref 0.0–0.4)
Eos: 3 %
Hematocrit: 42 % (ref 34.0–46.6)
Hemoglobin: 13.9 g/dL (ref 11.1–15.9)
Immature Grans (Abs): 0.1 x10E3/uL (ref 0.0–0.1)
Immature Granulocytes: 1 %
Lymphocytes Absolute: 2.2 x10E3/uL (ref 0.7–3.1)
Lymphs: 19 %
MCH: 31.1 pg (ref 26.6–33.0)
MCHC: 33.1 g/dL (ref 31.5–35.7)
MCV: 94 fL (ref 79–97)
Monocytes Absolute: 0.8 x10E3/uL (ref 0.1–0.9)
Monocytes: 7 %
Neutrophils Absolute: 8.2 x10E3/uL — ABNORMAL HIGH (ref 1.4–7.0)
Neutrophils: 70 %
Platelets: 486 x10E3/uL — ABNORMAL HIGH (ref 150–450)
RBC: 4.47 x10E6/uL (ref 3.77–5.28)
RDW: 12.5 % (ref 11.7–15.4)
WBC: 11.7 x10E3/uL — ABNORMAL HIGH (ref 3.4–10.8)

## 2024-06-07 ENCOUNTER — Ambulatory Visit: Payer: Self-pay | Admitting: Family Medicine

## 2024-08-05 ENCOUNTER — Ambulatory Visit: Admitting: Family Medicine
